# Patient Record
Sex: Female | Born: 1935 | Race: White | Hispanic: No | Smoking: Never smoker
Health system: Southern US, Community
[De-identification: ages and names within clinical notes are randomized; demographics above are authoritative.]

## PROBLEM LIST (undated history)

## (undated) DIAGNOSIS — K219 Gastro-esophageal reflux disease without esophagitis: Secondary | ICD-10-CM

## (undated) DIAGNOSIS — I10 Essential (primary) hypertension: Secondary | ICD-10-CM

## (undated) DIAGNOSIS — T4145XA Adverse effect of unspecified anesthetic, initial encounter: Secondary | ICD-10-CM

## (undated) DIAGNOSIS — T8859XA Other complications of anesthesia, initial encounter: Secondary | ICD-10-CM

## (undated) DIAGNOSIS — E785 Hyperlipidemia, unspecified: Secondary | ICD-10-CM

## (undated) DIAGNOSIS — Z9889 Other specified postprocedural states: Secondary | ICD-10-CM

## (undated) DIAGNOSIS — K469 Unspecified abdominal hernia without obstruction or gangrene: Secondary | ICD-10-CM

## (undated) DIAGNOSIS — I219 Acute myocardial infarction, unspecified: Secondary | ICD-10-CM

## (undated) DIAGNOSIS — R112 Nausea with vomiting, unspecified: Secondary | ICD-10-CM

## (undated) HISTORY — DX: Unspecified abdominal hernia without obstruction or gangrene: K46.9

## (undated) HISTORY — DX: Gastro-esophageal reflux disease without esophagitis: K21.9

## (undated) HISTORY — DX: Hyperlipidemia, unspecified: E78.5

---

## 1979-02-22 HISTORY — PX: VAGINAL HYSTERECTOMY: SUR661

## 1998-03-06 ENCOUNTER — Ambulatory Visit (HOSPITAL_COMMUNITY): Admission: RE | Admit: 1998-03-06 | Discharge: 1998-03-06 | Payer: Self-pay | Admitting: Obstetrics and Gynecology

## 1998-09-03 ENCOUNTER — Encounter: Payer: Self-pay | Admitting: Obstetrics and Gynecology

## 1998-09-03 ENCOUNTER — Ambulatory Visit (HOSPITAL_COMMUNITY): Admission: RE | Admit: 1998-09-03 | Discharge: 1998-09-03 | Payer: Self-pay | Admitting: Obstetrics and Gynecology

## 1998-12-13 ENCOUNTER — Encounter: Payer: Self-pay | Admitting: Obstetrics and Gynecology

## 1998-12-18 ENCOUNTER — Encounter (INDEPENDENT_AMBULATORY_CARE_PROVIDER_SITE_OTHER): Payer: Self-pay | Admitting: Specialist

## 1998-12-18 ENCOUNTER — Inpatient Hospital Stay (HOSPITAL_COMMUNITY): Admission: RE | Admit: 1998-12-18 | Discharge: 1998-12-19 | Payer: Self-pay | Admitting: Obstetrics and Gynecology

## 1999-03-08 ENCOUNTER — Ambulatory Visit (HOSPITAL_COMMUNITY): Admission: RE | Admit: 1999-03-08 | Discharge: 1999-03-08 | Payer: Self-pay | Admitting: *Deleted

## 1999-12-26 ENCOUNTER — Ambulatory Visit (HOSPITAL_BASED_OUTPATIENT_CLINIC_OR_DEPARTMENT_OTHER): Admission: RE | Admit: 1999-12-26 | Discharge: 1999-12-26 | Payer: Self-pay | Admitting: Obstetrics and Gynecology

## 2000-03-16 ENCOUNTER — Ambulatory Visit (HOSPITAL_COMMUNITY): Admission: RE | Admit: 2000-03-16 | Discharge: 2000-03-16 | Payer: Self-pay | Admitting: *Deleted

## 2001-03-18 ENCOUNTER — Ambulatory Visit (HOSPITAL_COMMUNITY): Admission: RE | Admit: 2001-03-18 | Discharge: 2001-03-18 | Payer: Self-pay | Admitting: *Deleted

## 2001-04-06 ENCOUNTER — Encounter: Admission: RE | Admit: 2001-04-06 | Discharge: 2001-04-06 | Payer: Self-pay | Admitting: *Deleted

## 2002-03-22 ENCOUNTER — Ambulatory Visit (HOSPITAL_COMMUNITY): Admission: RE | Admit: 2002-03-22 | Discharge: 2002-03-22 | Payer: Self-pay | Admitting: Internal Medicine

## 2002-03-22 ENCOUNTER — Encounter: Payer: Self-pay | Admitting: Internal Medicine

## 2003-03-24 ENCOUNTER — Encounter: Payer: Self-pay | Admitting: Internal Medicine

## 2003-03-24 ENCOUNTER — Ambulatory Visit (HOSPITAL_COMMUNITY): Admission: RE | Admit: 2003-03-24 | Discharge: 2003-03-24 | Payer: Self-pay | Admitting: Internal Medicine

## 2003-04-04 ENCOUNTER — Encounter: Payer: Self-pay | Admitting: Cardiology

## 2003-04-04 ENCOUNTER — Inpatient Hospital Stay (HOSPITAL_COMMUNITY): Admission: EM | Admit: 2003-04-04 | Discharge: 2003-04-05 | Payer: Self-pay | Admitting: *Deleted

## 2003-04-05 HISTORY — PX: CARDIAC CATHETERIZATION: SHX172

## 2004-04-02 ENCOUNTER — Ambulatory Visit (HOSPITAL_COMMUNITY): Admission: RE | Admit: 2004-04-02 | Discharge: 2004-04-02 | Payer: Self-pay | Admitting: Internal Medicine

## 2004-06-07 ENCOUNTER — Ambulatory Visit (HOSPITAL_COMMUNITY): Admission: RE | Admit: 2004-06-07 | Discharge: 2004-06-07 | Payer: Self-pay | Admitting: *Deleted

## 2004-06-07 ENCOUNTER — Encounter (INDEPENDENT_AMBULATORY_CARE_PROVIDER_SITE_OTHER): Payer: Self-pay | Admitting: Specialist

## 2005-04-08 ENCOUNTER — Ambulatory Visit (HOSPITAL_COMMUNITY): Admission: RE | Admit: 2005-04-08 | Discharge: 2005-04-08 | Payer: Self-pay | Admitting: Internal Medicine

## 2005-06-25 ENCOUNTER — Encounter: Admission: RE | Admit: 2005-06-25 | Discharge: 2005-06-25 | Payer: Self-pay | Admitting: Internal Medicine

## 2005-09-17 HISTORY — PX: KNEE ARTHROSCOPY: SUR90

## 2006-04-22 ENCOUNTER — Ambulatory Visit (HOSPITAL_COMMUNITY): Admission: RE | Admit: 2006-04-22 | Discharge: 2006-04-22 | Payer: Self-pay | Admitting: Internal Medicine

## 2007-04-30 ENCOUNTER — Ambulatory Visit (HOSPITAL_COMMUNITY): Admission: RE | Admit: 2007-04-30 | Discharge: 2007-04-30 | Payer: Self-pay | Admitting: Internal Medicine

## 2008-05-22 ENCOUNTER — Ambulatory Visit (HOSPITAL_COMMUNITY): Admission: RE | Admit: 2008-05-22 | Discharge: 2008-05-22 | Payer: Self-pay | Admitting: Internal Medicine

## 2009-05-28 ENCOUNTER — Ambulatory Visit (HOSPITAL_COMMUNITY): Admission: RE | Admit: 2009-05-28 | Discharge: 2009-05-28 | Payer: Self-pay | Admitting: Family Medicine

## 2010-05-31 ENCOUNTER — Ambulatory Visit (HOSPITAL_COMMUNITY)
Admission: RE | Admit: 2010-05-31 | Discharge: 2010-05-31 | Payer: Self-pay | Source: Home / Self Care | Attending: Internal Medicine | Admitting: Internal Medicine

## 2010-07-14 ENCOUNTER — Encounter: Payer: Self-pay | Admitting: Internal Medicine

## 2010-11-08 NOTE — Op Note (Signed)
Julie Petty, Julie Petty             ACCOUNT NO.:  [id number]   MEDICAL RECORD NO.:  [id number]          PATIENT TYPE:  AMB   LOCATION:  ENDO                         FACILITY:  [facility]   PHYSICIAN:  Georgiana Spinner, M.D.    DATE OF BIRTH:  Feb 09, 1932   DATE OF PROCEDURE:  06/07/2004  DATE OF DISCHARGE:                                 OPERATIVE REPORT   PROCEDURE:  Upper endoscopy.   INDICATIONS FOR PROCEDURE:  GERD   ANESTHESIA:  Demerol 50, Versed 5 mg.   DESCRIPTION OF PROCEDURE:  With the patient mildly sedated in the left  lateral decubitus position, the Olympus videoscopic endoscope was inserted  in the mouth and passed under direct vision through the esophagus which  appeared normal. There was no evidence of Barrett's.  On initial  visualization, we entered into the stomach. The fundus, body, antrum,  duodenal bulb and second portion of the duodenum were visualized.  From this  point, the endoscope was slowly withdrawn taking circumferential views of  the duodenal mucosa until the endoscope had been pulled back in the stomach,  placed in retroflexion to view the stomach from below. The endoscope was  then straightened and withdrawn taking circumferential views of the  remaining gastric and esophageal mucosa stopping in the body of the stomach  where there were some erythematous changes seen consistent with a possible  gastritis which was photographed and biopsied to rule out H. pylori.  The  endoscope was then withdrawn and stopped at the esophagus where there was an  [place] of tissue that was consistent with possible [place] of Barrett's which  also was then photographed and biopsied. The endoscope was withdrawn.  The  patient's vital signs and pulse oximeter remained stable. The patient  tolerated the procedure well without apparent complications.   FINDINGS:  Changes of erythema body of stomach, await biopsy report.  Question of Barrett's, await biopsy report. The patient will  call me for  results and followup with me as an outpatient.  Proceed to colonoscopy as  planned.      GMO/MEDQ  D:  06/07/2004  T:  06/07/2004  Job:  [id number]

## 2010-11-08 NOTE — Op Note (Signed)
[place]  Patient:    Julie Petty, Julie Petty                   MRN: [id number] Proc. Date: 12/26/99 Attending:  Charmian Muff, M.D. CC:         Dr. 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  POSTOPERATIVE DIAGNOSIS:  Bilateral upper eyelid dermatochalasis blocking the superior field of vision.  PROCEDURE:  Bilateral upper eyelid blepharoplasty.  SURGEON:  Charmian Muff, M.D.  ESTIMATED BLOOD LOSS:  2 cc.  SPECIMEN:  None.  COMPLICATIONS:  Without.  PROCEDURE:  The patient was taken to the operating room and placed in the supine position.  Electronic cardiac monitoring, intravenous and pulse oximeter and anesthesia standby were in place.  Xylocaine 2% with epinephrine and 0.75% Marcaine and Wydase were injected into both upper eyelids.  The facial area was draped and prepped per routine for oculoplastic surgery.  The eyelid creases were marked symmetrically, measuring 10 mm above the lashes in a vertical plane of the pupil.  The amount of skin to be removed was then marked off using the ______ forceps.  Dissection was carried down through the skin with the [place] tipped Bovie cautery.  The skin was then excised with the same cautery.  Meticulous hemostasis was obtained.  Skin was closed with a running 6-0 Prolene suture.  Polysporin ointment was placed in the incision.  The patient tolerated the procedure well without apparent complications and returned to the recovery room.  FOLLOW-UP:  I am to call the patient tomorrow morning and will setup for a follow-up appointment and see how the patient is doing.  PREOPERATIVE NOTE:  Julie Petty is a 77 year old woman, who complains of her eyelids drooping down.  When she pulls her eyelids up she can see T.V. better and read better.  On  examination vision with correction was 20/20 bilaterally.  The cornea showed a good tear film bilaterally.  The marginal reflex distance was 3 in both eyes.  She had 3+ dermatochalasis of both upper eyelids.  Goldmann visual field show improvement in the superior field of vision in the left eye from 10 degrees to 50 degrees and the right eye from 30 degrees to 50 degrees with the lids taped up as would be obtained by surgery.  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  DISPOSITION:  Bilateral upper eyelid blepharoplasty to be performed under local anesthesia as an outpatient. DD:  12/26/99 TD:  12/26/99 Job: [id number]

## 2010-11-08 NOTE — Op Note (Signed)
Julie Petty, Julie Petty             ACCOUNT NO.:  [id number]   MEDICAL RECORD NO.:  [id number]          PATIENT TYPE:  AMB   LOCATION:  ENDO                         FACILITY:  [facility]   PHYSICIAN:  Georgiana Spinner, M.D.    DATE OF BIRTH:  [birth year]   DATE OF PROCEDURE:  06/07/2004  DATE OF DISCHARGE:                                 OPERATIVE REPORT   PROCEDURE:  Colonoscopy.   ANESTHESIA:  Demerol 20, Versed 1 mg.   INDICATIONS FOR PROCEDURE:  Cancer screening.   DESCRIPTION OF PROCEDURE:  With the patient mildly sedated in the left  lateral decubitus position, the Olympus videoscopic colonoscope was inserted  in the rectum and passed under direct vision to the cecum identified by the  ileocecal valve and  base of cecum.  From this point, the colonoscope was  slowly withdrawn taking circumferential views of the colonic mucosa stopping  in the rectum which appeared normal on direct and showed hemorrhoids on  retroflexed view. The endoscope was straightened and withdrawn. The  patient's vital signs and pulse oximeter remained stable. The patient  tolerated the procedure well without apparent complications.   FINDINGS:  Internal hemorrhoids otherwise an unremarkable colonoscopic  examination to the cecum.   PLAN:  Consider repeat examination possibly in five years.      GMO/MEDQ  D:  06/07/2004  T:  06/07/2004  Job:  [id number]

## 2010-12-11 ENCOUNTER — Encounter (INDEPENDENT_AMBULATORY_CARE_PROVIDER_SITE_OTHER): Payer: Self-pay | Admitting: General Surgery

## 2010-12-16 ENCOUNTER — Other Ambulatory Visit (INDEPENDENT_AMBULATORY_CARE_PROVIDER_SITE_OTHER): Payer: Self-pay | Admitting: General Surgery

## 2010-12-16 ENCOUNTER — Ambulatory Visit
Admission: RE | Admit: 2010-12-16 | Discharge: 2010-12-16 | Disposition: A | Discharge status: Home / Self Care | Payer: Medicare Other | Source: Ambulatory Visit | Attending: General Surgery | Admitting: General Surgery

## 2010-12-16 DIAGNOSIS — K4090 Unilateral inguinal hernia, without obstruction or gangrene, not specified as recurrent: Secondary | ICD-10-CM

## 2010-12-20 DIAGNOSIS — K4090 Unilateral inguinal hernia, without obstruction or gangrene, not specified as recurrent: Secondary | ICD-10-CM

## 2011-01-07 ENCOUNTER — Telehealth (INDEPENDENT_AMBULATORY_CARE_PROVIDER_SITE_OTHER): Payer: Self-pay | Admitting: General Surgery

## 2011-01-13 ENCOUNTER — Encounter (INDEPENDENT_AMBULATORY_CARE_PROVIDER_SITE_OTHER): Payer: Self-pay | Admitting: General Surgery

## 2011-01-14 ENCOUNTER — Ambulatory Visit (INDEPENDENT_AMBULATORY_CARE_PROVIDER_SITE_OTHER): Payer: Medicare Other | Admitting: General Surgery

## 2011-01-14 DIAGNOSIS — K4090 Unilateral inguinal hernia, without obstruction or gangrene, not specified as recurrent: Secondary | ICD-10-CM | POA: Insufficient documentation

## 2011-01-14 NOTE — Progress Notes (Signed)
Operation: RIH repair  Date: 12/20/10  Pathology: Not done  HPI:  She is here for her first postop visit.  No pain in right groin.  Having some "shooting" pains around left anterior superior iliac spine.  No difficulty voiding or with BMs.   Physical Exam:  Right groin incision c/d/i with minimal swelling; repair solid.  Left groin with no hernia.  No LLQ hernia.  Some swelling and tenderness at ant. sup. iliac spine area.   Assessment:  s/p RIH repair.  Has muscular strain, most likely, on left side.  Doing well on right side.  Plan:  NSAID and moist heat to left side.  Return in 3 mos. if LLQ pain still present and will consider CT.  Continue light activities for 2 more weeks.  May walk ad lib.

## 2011-01-14 NOTE — Patient Instructions (Signed)
Call if left side better not resolved in 3 months.  Moist heat to area.

## 2011-01-23 ENCOUNTER — Encounter (INDEPENDENT_AMBULATORY_CARE_PROVIDER_SITE_OTHER): Payer: Self-pay | Admitting: General Surgery

## 2011-06-25 ENCOUNTER — Other Ambulatory Visit (HOSPITAL_COMMUNITY): Payer: Self-pay | Admitting: Internal Medicine

## 2011-06-25 DIAGNOSIS — Z1231 Encounter for screening mammogram for malignant neoplasm of breast: Secondary | ICD-10-CM

## 2011-07-23 ENCOUNTER — Ambulatory Visit (HOSPITAL_COMMUNITY): Payer: Medicare Other

## 2011-07-30 ENCOUNTER — Ambulatory Visit (HOSPITAL_COMMUNITY)
Admission: RE | Admit: 2011-07-30 | Discharge: 2011-07-30 | Disposition: A | Discharge status: Home / Self Care | Payer: Medicare Other | Source: Ambulatory Visit | Attending: Internal Medicine | Admitting: Internal Medicine

## 2011-07-30 DIAGNOSIS — Z1231 Encounter for screening mammogram for malignant neoplasm of breast: Secondary | ICD-10-CM

## 2012-05-03 ENCOUNTER — Encounter (INDEPENDENT_AMBULATORY_CARE_PROVIDER_SITE_OTHER): Payer: Medicare Other | Admitting: Ophthalmology

## 2012-05-03 DIAGNOSIS — D3130 Benign neoplasm of unspecified choroid: Secondary | ICD-10-CM

## 2012-05-03 DIAGNOSIS — Q143 Congenital malformation of choroid: Secondary | ICD-10-CM

## 2012-05-03 DIAGNOSIS — H43819 Vitreous degeneration, unspecified eye: Secondary | ICD-10-CM

## 2012-07-19 ENCOUNTER — Other Ambulatory Visit (HOSPITAL_COMMUNITY): Payer: Self-pay | Admitting: Internal Medicine

## 2012-07-19 DIAGNOSIS — Z1231 Encounter for screening mammogram for malignant neoplasm of breast: Secondary | ICD-10-CM

## 2012-08-02 ENCOUNTER — Ambulatory Visit (HOSPITAL_COMMUNITY)
Admission: RE | Admit: 2012-08-02 | Discharge: 2012-08-02 | Disposition: A | Discharge status: Home / Self Care | Payer: Medicare Other | Source: Ambulatory Visit | Attending: Internal Medicine | Admitting: Internal Medicine

## 2012-08-02 DIAGNOSIS — Z1231 Encounter for screening mammogram for malignant neoplasm of breast: Secondary | ICD-10-CM

## 2013-05-09 ENCOUNTER — Ambulatory Visit (INDEPENDENT_AMBULATORY_CARE_PROVIDER_SITE_OTHER): Payer: Medicare Other | Admitting: Ophthalmology

## 2013-05-09 DIAGNOSIS — H318 Other specified disorders of choroid: Secondary | ICD-10-CM

## 2013-05-09 DIAGNOSIS — D3130 Benign neoplasm of unspecified choroid: Secondary | ICD-10-CM

## 2013-08-03 ENCOUNTER — Other Ambulatory Visit (HOSPITAL_COMMUNITY): Payer: Self-pay | Admitting: Internal Medicine

## 2013-08-03 DIAGNOSIS — Z1231 Encounter for screening mammogram for malignant neoplasm of breast: Secondary | ICD-10-CM

## 2013-08-11 ENCOUNTER — Ambulatory Visit (HOSPITAL_COMMUNITY): Payer: Medicare Other

## 2013-08-18 ENCOUNTER — Ambulatory Visit (HOSPITAL_COMMUNITY): Payer: Medicare Other

## 2013-11-08 ENCOUNTER — Ambulatory Visit (HOSPITAL_COMMUNITY): Payer: Medicare Other

## 2013-11-17 ENCOUNTER — Ambulatory Visit (HOSPITAL_COMMUNITY)
Admission: RE | Admit: 2013-11-17 | Discharge: 2013-11-17 | Disposition: A | Discharge status: Home / Self Care | Payer: Medicare Other | Source: Ambulatory Visit | Attending: Internal Medicine | Admitting: Internal Medicine

## 2013-11-17 DIAGNOSIS — Z1231 Encounter for screening mammogram for malignant neoplasm of breast: Secondary | ICD-10-CM

## 2014-05-09 ENCOUNTER — Ambulatory Visit (INDEPENDENT_AMBULATORY_CARE_PROVIDER_SITE_OTHER): Payer: Medicare Other | Admitting: Ophthalmology

## 2014-05-09 DIAGNOSIS — I10 Essential (primary) hypertension: Secondary | ICD-10-CM

## 2014-05-09 DIAGNOSIS — D3132 Benign neoplasm of left choroid: Secondary | ICD-10-CM

## 2014-05-09 DIAGNOSIS — H43813 Vitreous degeneration, bilateral: Secondary | ICD-10-CM

## 2014-05-09 DIAGNOSIS — H35033 Hypertensive retinopathy, bilateral: Secondary | ICD-10-CM

## 2014-07-05 DIAGNOSIS — I10 Essential (primary) hypertension: Secondary | ICD-10-CM | POA: Diagnosis not present

## 2014-07-21 DIAGNOSIS — I10 Essential (primary) hypertension: Secondary | ICD-10-CM | POA: Diagnosis not present

## 2014-07-21 DIAGNOSIS — E782 Mixed hyperlipidemia: Secondary | ICD-10-CM | POA: Diagnosis not present

## 2014-07-21 DIAGNOSIS — N183 Chronic kidney disease, stage 3 (moderate): Secondary | ICD-10-CM | POA: Diagnosis not present

## 2014-11-14 ENCOUNTER — Other Ambulatory Visit (HOSPITAL_COMMUNITY): Payer: Self-pay | Admitting: Internal Medicine

## 2014-11-14 DIAGNOSIS — Z1231 Encounter for screening mammogram for malignant neoplasm of breast: Secondary | ICD-10-CM

## 2014-11-21 ENCOUNTER — Ambulatory Visit (HOSPITAL_COMMUNITY)
Admission: RE | Admit: 2014-11-21 | Discharge: 2014-11-21 | Disposition: A | Discharge status: Home / Self Care | Payer: Medicare Other | Source: Ambulatory Visit | Attending: Internal Medicine | Admitting: Internal Medicine

## 2014-11-21 DIAGNOSIS — Z1231 Encounter for screening mammogram for malignant neoplasm of breast: Secondary | ICD-10-CM | POA: Diagnosis not present

## 2014-12-22 DIAGNOSIS — Z0000 Encounter for general adult medical examination without abnormal findings: Secondary | ICD-10-CM | POA: Diagnosis not present

## 2014-12-22 DIAGNOSIS — I10 Essential (primary) hypertension: Secondary | ICD-10-CM | POA: Diagnosis not present

## 2014-12-22 DIAGNOSIS — E782 Mixed hyperlipidemia: Secondary | ICD-10-CM | POA: Diagnosis not present

## 2014-12-22 DIAGNOSIS — N183 Chronic kidney disease, stage 3 (moderate): Secondary | ICD-10-CM | POA: Diagnosis not present

## 2015-01-01 DIAGNOSIS — K219 Gastro-esophageal reflux disease without esophagitis: Secondary | ICD-10-CM | POA: Diagnosis not present

## 2015-01-01 DIAGNOSIS — R05 Cough: Secondary | ICD-10-CM | POA: Diagnosis not present

## 2015-01-02 DIAGNOSIS — I10 Essential (primary) hypertension: Secondary | ICD-10-CM | POA: Diagnosis not present

## 2015-01-02 DIAGNOSIS — M791 Myalgia: Secondary | ICD-10-CM | POA: Diagnosis not present

## 2015-01-02 DIAGNOSIS — Z0000 Encounter for general adult medical examination without abnormal findings: Secondary | ICD-10-CM | POA: Diagnosis not present

## 2015-01-02 DIAGNOSIS — Z780 Asymptomatic menopausal state: Secondary | ICD-10-CM | POA: Diagnosis not present

## 2015-01-02 DIAGNOSIS — N183 Chronic kidney disease, stage 3 (moderate): Secondary | ICD-10-CM | POA: Diagnosis not present

## 2015-01-02 DIAGNOSIS — E782 Mixed hyperlipidemia: Secondary | ICD-10-CM | POA: Diagnosis not present

## 2015-01-18 DIAGNOSIS — H04123 Dry eye syndrome of bilateral lacrimal glands: Secondary | ICD-10-CM | POA: Diagnosis not present

## 2015-01-18 DIAGNOSIS — H3531 Nonexudative age-related macular degeneration: Secondary | ICD-10-CM | POA: Diagnosis not present

## 2015-01-18 DIAGNOSIS — Z961 Presence of intraocular lens: Secondary | ICD-10-CM | POA: Diagnosis not present

## 2015-01-18 DIAGNOSIS — H4011X1 Primary open-angle glaucoma, mild stage: Secondary | ICD-10-CM | POA: Diagnosis not present

## 2015-01-29 DIAGNOSIS — R05 Cough: Secondary | ICD-10-CM | POA: Diagnosis not present

## 2015-04-18 DIAGNOSIS — H04123 Dry eye syndrome of bilateral lacrimal glands: Secondary | ICD-10-CM | POA: Diagnosis not present

## 2015-04-18 DIAGNOSIS — H401131 Primary open-angle glaucoma, bilateral, mild stage: Secondary | ICD-10-CM | POA: Diagnosis not present

## 2015-04-18 DIAGNOSIS — H353130 Nonexudative age-related macular degeneration, bilateral, stage unspecified: Secondary | ICD-10-CM | POA: Diagnosis not present

## 2015-04-18 DIAGNOSIS — H01003 Unspecified blepharitis right eye, unspecified eyelid: Secondary | ICD-10-CM | POA: Diagnosis not present

## 2015-05-11 ENCOUNTER — Ambulatory Visit (INDEPENDENT_AMBULATORY_CARE_PROVIDER_SITE_OTHER): Payer: Medicare Other | Admitting: Ophthalmology

## 2015-07-10 DIAGNOSIS — E782 Mixed hyperlipidemia: Secondary | ICD-10-CM | POA: Diagnosis not present

## 2015-07-10 DIAGNOSIS — N183 Chronic kidney disease, stage 3 (moderate): Secondary | ICD-10-CM | POA: Diagnosis not present

## 2015-07-10 DIAGNOSIS — I10 Essential (primary) hypertension: Secondary | ICD-10-CM | POA: Diagnosis not present

## 2015-07-17 DIAGNOSIS — L03115 Cellulitis of right lower limb: Secondary | ICD-10-CM | POA: Diagnosis not present

## 2015-07-17 DIAGNOSIS — N183 Chronic kidney disease, stage 3 (moderate): Secondary | ICD-10-CM | POA: Diagnosis not present

## 2015-07-17 DIAGNOSIS — E782 Mixed hyperlipidemia: Secondary | ICD-10-CM | POA: Diagnosis not present

## 2015-07-17 DIAGNOSIS — I10 Essential (primary) hypertension: Secondary | ICD-10-CM | POA: Diagnosis not present

## 2015-08-11 DIAGNOSIS — L309 Dermatitis, unspecified: Secondary | ICD-10-CM | POA: Diagnosis not present

## 2015-08-31 DIAGNOSIS — D0462 Carcinoma in situ of skin of left upper limb, including shoulder: Secondary | ICD-10-CM | POA: Diagnosis not present

## 2015-08-31 DIAGNOSIS — D225 Melanocytic nevi of trunk: Secondary | ICD-10-CM | POA: Diagnosis not present

## 2015-08-31 DIAGNOSIS — L723 Sebaceous cyst: Secondary | ICD-10-CM | POA: Diagnosis not present

## 2015-09-12 DIAGNOSIS — L723 Sebaceous cyst: Secondary | ICD-10-CM | POA: Diagnosis not present

## 2015-10-15 DIAGNOSIS — I10 Essential (primary) hypertension: Secondary | ICD-10-CM | POA: Diagnosis not present

## 2015-10-15 DIAGNOSIS — E782 Mixed hyperlipidemia: Secondary | ICD-10-CM | POA: Diagnosis not present

## 2015-10-15 DIAGNOSIS — R05 Cough: Secondary | ICD-10-CM | POA: Diagnosis not present

## 2015-10-16 DIAGNOSIS — Z08 Encounter for follow-up examination after completed treatment for malignant neoplasm: Secondary | ICD-10-CM | POA: Diagnosis not present

## 2015-10-16 DIAGNOSIS — Z85828 Personal history of other malignant neoplasm of skin: Secondary | ICD-10-CM | POA: Diagnosis not present

## 2015-10-16 DIAGNOSIS — L723 Sebaceous cyst: Secondary | ICD-10-CM | POA: Diagnosis not present

## 2016-03-26 ENCOUNTER — Other Ambulatory Visit: Payer: Self-pay | Admitting: Physician Assistant

## 2016-03-26 ENCOUNTER — Other Ambulatory Visit: Payer: Self-pay | Admitting: Internal Medicine

## 2016-03-26 DIAGNOSIS — Z1231 Encounter for screening mammogram for malignant neoplasm of breast: Secondary | ICD-10-CM

## 2016-04-10 ENCOUNTER — Ambulatory Visit
Admission: RE | Admit: 2016-04-10 | Discharge: 2016-04-10 | Disposition: A | Discharge status: Home / Self Care | Payer: Medicare Other | Source: Ambulatory Visit | Attending: Physician Assistant | Admitting: Physician Assistant

## 2016-04-10 DIAGNOSIS — Z1231 Encounter for screening mammogram for malignant neoplasm of breast: Secondary | ICD-10-CM

## 2017-03-05 ENCOUNTER — Other Ambulatory Visit: Payer: Self-pay | Admitting: Physician Assistant

## 2017-03-05 DIAGNOSIS — Z1231 Encounter for screening mammogram for malignant neoplasm of breast: Secondary | ICD-10-CM

## 2017-04-13 ENCOUNTER — Ambulatory Visit
Admission: RE | Admit: 2017-04-13 | Discharge: 2017-04-13 | Disposition: A | Discharge status: Home / Self Care | Payer: Self-pay | Source: Ambulatory Visit | Attending: Physician Assistant | Admitting: Physician Assistant

## 2017-04-13 DIAGNOSIS — Z1231 Encounter for screening mammogram for malignant neoplasm of breast: Secondary | ICD-10-CM

## 2018-03-29 ENCOUNTER — Other Ambulatory Visit: Payer: Self-pay | Admitting: Physician Assistant

## 2018-03-29 DIAGNOSIS — Z1231 Encounter for screening mammogram for malignant neoplasm of breast: Secondary | ICD-10-CM

## 2018-04-24 ENCOUNTER — Inpatient Hospital Stay (HOSPITAL_COMMUNITY)
Admission: EM | Admit: 2018-04-24 | Discharge: 2018-04-28 | DRG: 281 | Disposition: A | Discharge status: Home / Self Care | Payer: Medicare Other | Attending: Cardiovascular Disease | Admitting: Cardiovascular Disease

## 2018-04-24 ENCOUNTER — Encounter (HOSPITAL_COMMUNITY): Payer: Self-pay | Admitting: Emergency Medicine

## 2018-04-24 ENCOUNTER — Emergency Department (HOSPITAL_COMMUNITY): Payer: Medicare Other

## 2018-04-24 ENCOUNTER — Other Ambulatory Visit: Payer: Self-pay

## 2018-04-24 DIAGNOSIS — I4510 Unspecified right bundle-branch block: Secondary | ICD-10-CM | POA: Diagnosis present

## 2018-04-24 DIAGNOSIS — I429 Cardiomyopathy, unspecified: Secondary | ICD-10-CM

## 2018-04-24 DIAGNOSIS — Z882 Allergy status to sulfonamides status: Secondary | ICD-10-CM

## 2018-04-24 DIAGNOSIS — I959 Hypotension, unspecified: Secondary | ICD-10-CM | POA: Diagnosis present

## 2018-04-24 DIAGNOSIS — I2510 Atherosclerotic heart disease of native coronary artery without angina pectoris: Secondary | ICD-10-CM | POA: Diagnosis present

## 2018-04-24 DIAGNOSIS — K219 Gastro-esophageal reflux disease without esophagitis: Secondary | ICD-10-CM | POA: Diagnosis present

## 2018-04-24 DIAGNOSIS — Z885 Allergy status to narcotic agent status: Secondary | ICD-10-CM | POA: Diagnosis not present

## 2018-04-24 DIAGNOSIS — I214 Non-ST elevation (NSTEMI) myocardial infarction: Principal | ICD-10-CM

## 2018-04-24 DIAGNOSIS — Z90710 Acquired absence of both cervix and uterus: Secondary | ICD-10-CM

## 2018-04-24 DIAGNOSIS — R739 Hyperglycemia, unspecified: Secondary | ICD-10-CM | POA: Diagnosis present

## 2018-04-24 DIAGNOSIS — E7800 Pure hypercholesterolemia, unspecified: Secondary | ICD-10-CM | POA: Diagnosis not present

## 2018-04-24 DIAGNOSIS — H409 Unspecified glaucoma: Secondary | ICD-10-CM | POA: Diagnosis present

## 2018-04-24 DIAGNOSIS — I471 Supraventricular tachycardia: Secondary | ICD-10-CM | POA: Diagnosis present

## 2018-04-24 DIAGNOSIS — Z8249 Family history of ischemic heart disease and other diseases of the circulatory system: Secondary | ICD-10-CM

## 2018-04-24 DIAGNOSIS — E785 Hyperlipidemia, unspecified: Secondary | ICD-10-CM | POA: Insufficient documentation

## 2018-04-24 DIAGNOSIS — R079 Chest pain, unspecified: Secondary | ICD-10-CM | POA: Diagnosis present

## 2018-04-24 DIAGNOSIS — I082 Rheumatic disorders of both aortic and tricuspid valves: Secondary | ICD-10-CM | POA: Diagnosis present

## 2018-04-24 DIAGNOSIS — I5030 Unspecified diastolic (congestive) heart failure: Secondary | ICD-10-CM | POA: Diagnosis not present

## 2018-04-24 DIAGNOSIS — I10 Essential (primary) hypertension: Secondary | ICD-10-CM

## 2018-04-24 HISTORY — DX: Essential (primary) hypertension: I10

## 2018-04-24 LAB — TSH: TSH: 1.259 u[IU]/mL (ref 0.350–4.500)

## 2018-04-24 LAB — CBC
HCT: 43.4 % (ref 36.0–46.0)
Hemoglobin: 13.6 g/dL (ref 12.0–15.0)
MCH: 31.6 pg (ref 26.0–34.0)
MCHC: 31.3 g/dL (ref 30.0–36.0)
MCV: 100.9 fL — AB (ref 80.0–100.0)
NRBC: 0 % (ref 0.0–0.2)
Platelets: 219 10*3/uL (ref 150–400)
RBC: 4.3 MIL/uL (ref 3.87–5.11)
RDW: 13 % (ref 11.5–15.5)
WBC: 6.5 10*3/uL (ref 4.0–10.5)

## 2018-04-24 LAB — BASIC METABOLIC PANEL
ANION GAP: 9 (ref 5–15)
BUN: 23 mg/dL (ref 8–23)
CALCIUM: 9.4 mg/dL (ref 8.9–10.3)
CHLORIDE: 104 mmol/L (ref 98–111)
CO2: 24 mmol/L (ref 22–32)
Creatinine, Ser: 1.1 mg/dL — ABNORMAL HIGH (ref 0.44–1.00)
GFR calc Af Amer: 52 mL/min — ABNORMAL LOW (ref >60)
GFR calc non Af Amer: 45 mL/min — ABNORMAL LOW (ref >60)
GLUCOSE: 134 mg/dL — AB (ref 70–99)
POTASSIUM: 4.7 mmol/L (ref 3.5–5.1)
Sodium: 137 mmol/L (ref 135–145)

## 2018-04-24 LAB — I-STAT TROPONIN, ED: Troponin i, poc: 0.25 ng/mL (ref 0.00–0.08)

## 2018-04-24 LAB — HEPATIC FUNCTION PANEL
ALT: 16 U/L (ref 0–44)
AST: 32 U/L (ref 15–41)
Albumin: 3.7 g/dL (ref 3.5–5.0)
Alkaline Phosphatase: 45 U/L (ref 38–126)
BILIRUBIN INDIRECT: 0.7 mg/dL (ref 0.3–0.9)
Bilirubin, Direct: 0.2 mg/dL (ref 0.0–0.2)
TOTAL PROTEIN: 6.8 g/dL (ref 6.5–8.1)
Total Bilirubin: 0.9 mg/dL (ref 0.3–1.2)

## 2018-04-24 LAB — TROPONIN I
TROPONIN I: 3.34 ng/mL — AB (ref <0.03)
Troponin I: 1.12 ng/mL (ref <0.03)

## 2018-04-24 LAB — HEMOGLOBIN A1C
HEMOGLOBIN A1C: 5.6 % (ref 4.8–5.6)
MEAN PLASMA GLUCOSE: 114.02 mg/dL

## 2018-04-24 LAB — MRSA PCR SCREENING: MRSA BY PCR: NEGATIVE

## 2018-04-24 MED ORDER — ACETAMINOPHEN 325 MG PO TABS: 650.0000 mg | ORAL_TABLET | ORAL | Status: DC | PRN

## 2018-04-24 MED ORDER — HEPARIN (PORCINE) IN NACL 100-0.45 UNIT/ML-% IJ SOLN
800.0000 [IU]/h | INTRAMUSCULAR | Status: DC
  Administered 2018-04-24: 750 [IU]/h via INTRAVENOUS
  Administered 2018-04-25: 800 [IU]/h via INTRAVENOUS
  Filled 2018-04-24 (×2): qty 250

## 2018-04-24 MED ORDER — METOPROLOL TARTRATE 25 MG PO TABS
25.0000 mg | ORAL_TABLET | Freq: Two times a day (BID) | ORAL | Status: DC
  Administered 2018-04-24: 25 mg via ORAL
  Filled 2018-04-24: qty 1

## 2018-04-24 MED ORDER — SODIUM CHLORIDE 0.9% FLUSH: 3.0000 mL | INTRAVENOUS | Status: DC | PRN

## 2018-04-24 MED ORDER — SODIUM CHLORIDE 0.9 % IV SOLN: 250.0000 mL | INTRAVENOUS | Status: DC | PRN

## 2018-04-24 MED ORDER — BRINZOLAMIDE 1 % OP SUSP
1.0000 [drp] | Freq: Two times a day (BID) | OPHTHALMIC | Status: DC
  Administered 2018-04-24 – 2018-04-28 (×8): 1 [drp] via OPHTHALMIC
  Filled 2018-04-24: qty 10

## 2018-04-24 MED ORDER — ONDANSETRON HCL 4 MG/2ML IJ SOLN: 4.0000 mg | Freq: Four times a day (QID) | INTRAMUSCULAR | Status: DC | PRN

## 2018-04-24 MED ORDER — ATORVASTATIN CALCIUM 40 MG PO TABS
40.0000 mg | ORAL_TABLET | Freq: Every evening | ORAL | Status: DC
  Administered 2018-04-24 – 2018-04-25 (×2): 40 mg via ORAL
  Filled 2018-04-24 (×2): qty 1

## 2018-04-24 MED ORDER — ASPIRIN EC 81 MG PO TBEC
81.0000 mg | DELAYED_RELEASE_TABLET | Freq: Every day | ORAL | Status: DC
  Administered 2018-04-25 – 2018-04-28 (×3): 81 mg via ORAL
  Filled 2018-04-24 (×3): qty 1

## 2018-04-24 MED ORDER — ACETAMINOPHEN 325 MG PO TABS
650.0000 mg | ORAL_TABLET | Freq: Once | ORAL | Status: AC
  Administered 2018-04-24: 650 mg via ORAL
  Filled 2018-04-24: qty 2

## 2018-04-24 MED ORDER — SODIUM CHLORIDE 0.9% FLUSH
3.0000 mL | Freq: Two times a day (BID) | INTRAVENOUS | Status: DC
  Administered 2018-04-25 – 2018-04-27 (×4): 3 mL via INTRAVENOUS

## 2018-04-24 MED ORDER — CALCIUM CARBONATE 1250 (500 CA) MG PO TABS
625.0000 mg | ORAL_TABLET | ORAL | Status: DC
  Administered 2018-04-26: 625 mg via ORAL
  Administered 2018-04-28: 500 mg via ORAL
  Filled 2018-04-24 (×3): qty 1

## 2018-04-24 MED ORDER — NITROGLYCERIN 0.4 MG SL SUBL: 0.4000 mg | SUBLINGUAL_TABLET | SUBLINGUAL | Status: DC | PRN

## 2018-04-24 MED ORDER — HEPARIN BOLUS VIA INFUSION
3000.0000 [IU] | Freq: Once | INTRAVENOUS | Status: AC
  Administered 2018-04-24: 3000 [IU] via INTRAVENOUS
  Filled 2018-04-24: qty 3000

## 2018-04-24 NOTE — ED Triage Notes (Signed)
Per EMS- Pt here from home for eval of cp after having family in town, she was up walking a lot and thinks she over did it. She had cp to central chest 324 Asprin at home. Pain currently 4/10. She has RBBB with hx of same. Pt had 1 nitro with no relief.

## 2018-04-24 NOTE — H&P (Addendum)
Cardiology History & Physical    Patient ID: Julie Petty MRN: [id number], DOB: July 02, 1931 Date of Encounter: 04/24/2018, 4:22 PM Primary Physician: Aura Dials, PA-C Primary Cardiologist: No primary care provider on file. New  Primary Electrophysiologist:  None  Chief Complaint: chest pain while shopping Reason for Admission: suspected NSTEMI Requesting MD: Dr Ralene Bathe  HPI: Julie Petty is a 84 y.o. active female with history of HTN & HLD (reported to be controlled on medication), GERD, glaucoma with no prior cardiac hx. She reports a remote cath 15 years ago that was OK. Within her family line of inlaws she has met our fellow Dr. Rhae Hammock before. She is very active and participates in [employer] and does a pedal machine seated at home. She has not had any recent cardiac symptoms.  Today she was in her usual state of health. One of her grandchildren is getting married in a few months so she was shopping for a dress. While at [facility] trying on an outfit she developed left sided substernal chest discomfort that did occasionally radiate to her left jaw. It was not associated with any SOB, n/v, diaphoresis, palpitations. She had never had this before. She felt the need to sit down as she felt unwell. She got herself to her car and drank some water and drove home. She called a friend who'd had heart issues and collectively they decided she should call EMS. En route she received 313m ASA and 1 SL NTG which did not immediately relieve pain, but it has subsided since arriving in the ER. She is now pain free. Initial troponin 0.25 with subsequent value 1.12. Labs otherwise show mild renal insufficiency adjusted for age with Cr 1.1, mild macrocytosis but normal Hgb. CXR shows cardiomegaly and mild bilateral interstitial thickening but no acute changes. VSS - BP low/normal. She has been started on heparin by the ER. EKG shows NSR with IRBBB, cannot exclude Q waves V1-V3, otherwise no acute ST-T  changes.  Past Medical History:  Diagnosis Date  . Essential hypertension   . GERD (gastroesophageal reflux disease)   . Glaucoma   . Hernia   . Hyperlipemia      Surgical History:  Past Surgical History:  Procedure Laterality Date  . CARDIAC CATHETERIZATION  04/05/03  . KNEE ARTHROSCOPY  09/17/05   left  . VAGINAL HYSTERECTOMY  1980's     Home Meds: Prior to Admission medications   Medication Sig Start Date End Date Taking? Authorizing Provider  amLODipine (NORVASC) 5 MG tablet Take 5 mg by mouth daily. 04/12/18  Yes [provider]  atorvastatin (LIPITOR) 20 MG tablet Take 20 mg by mouth every other day.    Yes [provider]  brinzolamide (AZOPT) 1 % ophthalmic suspension Place 1 drop into the right eye 2 (two) times daily.    Yes [provider]  calcium carbonate (OS-CAL) 600 MG TABS Take 600 mg by mouth every other day.    Yes [provider]  triamcinolone (KENALOG) 0.025 % ointment Apply 1 application topically daily. 03/22/18  Yes [provider]    Allergies:  Allergies  Allergen Reactions  . Codeine Nausea Only  . Sulfur Nausea Only    Social History   Socioeconomic History  . Marital status: Divorced    Spouse name: Not on file  . Number of children: Not on file  . Years of education: Not on file  . Highest education level: Not on file  Occupational History  .  Not on file  Social Needs  . Financial resource strain: Not on file  . Food insecurity:    Worry: Not on file    Inability: Not on file  . Transportation needs:    Medical: Not on file    Non-medical: Not on file  Tobacco Use  . Smoking status: Never Smoker  Substance and Sexual Activity  . Alcohol use: No  . Drug use: Not on file  . Sexual activity: Not on file  Lifestyle  . Physical activity:    Days per week: Not on file    Minutes per session: Not on file  . Stress: Not on file  Relationships  . Social connections:    Talks on phone:  Not on file    Gets together: Not on file    Attends religious service: Not on file    Active member of club or organization: Not on file    Attends meetings of clubs or organizations: Not on file    Relationship status: Not on file  . Intimate partner violence:    Fear of current or ex partner: Not on file    Emotionally abused: Not on file    Physically abused: Not on file    Forced sexual activity: Not on file  Other Topics Concern  . Not on file  Social History Narrative  . Not on file     Family History  Problem Relation Age of Onset  . Heart attack Brother     Review of Systems:  All other systems reviewed and are otherwise negative except as noted above.  Labs:   Lab Results  Component Value Date   WBC 6.5 04/24/2018   HGB 13.6 04/24/2018   HCT 43.4 04/24/2018   MCV 100.9 (H) 04/24/2018   PLT 219 04/24/2018    Recent Labs  Lab 04/24/18 1320  NA 137  K 4.7  CL 104  CO2 24  BUN 23  CREATININE 1.10*  CALCIUM 9.4  GLUCOSE 134*   No results for input(s): CKTOTAL, CKMB, TROPONINI in the last 72 hours. No results found for: CHOL, HDL, LDLCALC, TRIG No results found for: DDIMER  Radiology/Studies:  Dg Chest Port 1 View  Result Date: 04/24/2018 CLINICAL DATA:  Left-sided chest pain for 4 hours. No shortness of breath. EXAM: PORTABLE CHEST 1 VIEW COMPARISON:  12/16/2010 FINDINGS: There is bilateral mild interstitial thickening. There is no focal consolidation. There is no pleural effusion or pneumothorax. There is stable cardiomegaly. There is thoracic aortic atherosclerosis. The osseous structures are unremarkable. IMPRESSION: 1. No acute cardiopulmonary disease. 2. Cardiomegaly. Electronically Signed   By: Kathreen Devoid   On: 04/24/2018 15:26   Wt Readings from Last 3 Encounters:  04/24/18 59.9 kg    EKG: NSR with IRBBB, cannot exclude Q waves V1-V3, otherwise no acute ST-T changes.  Physical Exam: Blood pressure 101/61, pulse 63, temperature 98.2 F  (36.8 C), temperature source Oral, resp. rate (!) 22, height 5' 2.5" (1.588 m), weight 59.9 kg, SpO2 98 %. Body mass index is 23.76 kg/m. General: Well developed, well nourished WF, in no acute distress. Alert, friendly. Head: Normocephalic, atraumatic, sclera non-icteric, no xanthomas, nares are without discharge.  Neck: Negative for carotid bruits. JVD not elevated. Lungs: Clear bilaterally to auscultation without wheezes, rales, or rhonchi. Breathing is unlabored. Heart: RRR with S1 S2. No murmurs, rubs, or gallops appreciated. Abdomen: Soft, non-tender, non-distended with normoactive bowel sounds. No hepatomegaly. No rebound/guarding. No obvious abdominal masses. Msk:  Strength  and tone appear normal for age. Extremities: No clubbing or cyanosis. No edema.  Distal pedal pulses are 2+ and equal bilaterally. Neuro: Alert and oriented X 3. No focal deficit. No facial asymmetry. Moves all extremities spontaneously. Psych:  Responds to questions appropriately with a normal affect.    Assessment and Plan   1. Chest pain/probable NSTEMI - troponin 0.25->1.12 with history suggest of NSTEMI. She is fairly active. Continue heparin and add scheduled ASA. Titrate atorvastatin. Anticipate MD will advise cath on Monday. Will place inpatient orders and also obtain echocardiogram. If patient remains stable tomorrow will need pre-cath orders for Monday. I will add to the add-on board.  2. Essential HTN - controlled. Per d/w MD, change amlodipine to BB given MI.  3. Hyperlipidemia - will titrate atorvastatin and obtain lipid panel in AM. If the patient is tolerating statin at time of follow-up appointment, would consider rechecking liver function/lipid panel in 6-8 weeks.  4. Glaucoma - continue home regimen.  5. Hyperglycemia - check A1C.  6. Mild renal insufficiency - trend Cr. May represent CKD.  Code status: patient would like to give this some more thought as she has contemplated a  do-not-rescuscitate. but also acknowledges a good quality of life and . In this setting she wishes to remain full code which we will honor. She will have ongoing discussions with family and review her "paperwork at home" and inform us of any changes.  Severity of Illness: The appropriate patient status for this patient is INPATIENT. Inpatient status is judged to be reasonable and necessary in order to provide the required intensity of service to ensure the patient's safety. The patient's presenting symptoms, physical exam findings, and initial radiographic and laboratory data in the context of their chronic comorbidities is felt to place them at high risk for further clinical deterioration. Furthermore, it is not anticipated that the patient will be medically stable for discharge from the hospital within 2 midnights of admission. The following factors support the patient status of inpatient.   " The patient's presenting symptoms include chest pain. " The worrisome physical exam findings include patient pointing to prior site of pain. " The initial radiographic and laboratory data are worrisome because of elevated troponin. " The chronic co-morbidities include outlined above, HTN, HLD.   * I certify that at the point of admission it is my clinical judgment that the patient will require inpatient hospital care spanning beyond 2 midnights from the point of admission due to high intensity of service, high risk for further deterioration and high frequency of surveillance required.*    For questions or updates, please contact [place] Please consult [email or web address] for contact info under Cardiology/STEMI.  Signed, Charlie Pitter, PA-C 04/24/2018, 4:22 PM   I have seen and examined the patient along with Melina Copa, PA .  I have reviewed the chart, notes and new data.  I agree with PA/NP's note.  Key new complaints: She describes a classical pattern of unstable angina pectoris, worsened by physical  activity, but persistent at rest. Key examination changes: Normal cardiovascular exam Key new findings / data: ECG does not show high risk findings or any abnormalities that could localize the ischemic territory; cardiac enzymes are clearly abnormal and in a slowly rising pattern.  PLAN: Recommend coronary angiography.  Meanwhile we will keep on intravenous heparin.  Switch from amlodipine to beta-blockers.  Aspirin.  Reevaluate lipid profile, target LDL less than 70. The diagnostic catheterization and possible percutaneous revascularization with angioplasty/stent procedure  has been fully reviewed with the patient and written informed consent has been obtained.   Sanda Klein, MD, [place] [phone] 04/24/2018, 5:21 PM

## 2018-04-24 NOTE — ED Notes (Signed)
EDP and cardiology notified of elevated troponin

## 2018-04-24 NOTE — Progress Notes (Deleted)
ANTICOAGULATION CONSULT NOTE - Initial Consult  Pharmacy Consult for Heparin Indication: chest pain/ACS  Allergies  Allergen Reactions  . Codeine Nausea Only  . Sulfur Nausea Only   Patient Measurements: Height: 5' 2.5" (158.8 cm) Weight: 132 lb (59.9 kg) IBW/kg (Calculated) : 51.25 Heparin Dosing Weight: 59.9 kg  Vital Signs: Temp: 98.2 F (36.8 C) (11/02 1313) Temp Source: Oral (11/02 1313) BP: 109/64 (11/02 1715) Pulse Rate: 80 (11/02 1715)  Labs: Recent Labs    04/24/18 1320 04/24/18 1520  HGB 13.6  --   HCT 43.4  --   PLT 219  --   CREATININE 1.10*  --   TROPONINI  --  1.12*   Estimated Creatinine Clearance: 30.8 mL/min (A) (by C-G formula based on SCr of 1.1 mg/dL (H)).  Assessment: 18 yoF presenting with acute onset L sided substernal chest discomfort with occasional radiation to L jaw. Elevated troponin with no acute ST changes on EKG. Pharmacy consulted to start IV heparin with anticipation of cardiac cath. No AC PTA. CBC WNL, no bleeding noted.  Goal of Therapy:  Heparin level 0.3-0.7 units/ml Monitor platelets by anticoagulation protocol: Yes   Plan:  Give heparin 3500 unit bolus IV x1 Start heparin gtt at 750 units/hr Check heparin level in 8 hours Daily heparin level and CBC Monitor for s/sx of bleeding  Erin N. Gerarda Fraction, PharmD PGY2 Infectious Diseases Pharmacy Resident Phone: [phone] 04/24/2018,5:28 PM

## 2018-04-24 NOTE — ED Notes (Addendum)
Messaged main pharmacy in regards to Heparin.

## 2018-04-24 NOTE — ED Provider Notes (Signed)
[place] EMERGENCY DEPARTMENT Provider Note   CSN: [id number] Arrival date & time: 04/24/18  1303     History   Chief Complaint Chief Complaint  Patient presents with  . Chest Pain    HPI Julie Petty is a 84 y.o. female.  The history is provided by the patient and a relative. No language interpreter was used.  Chest Pain     Julie Petty is a 84 y.o. female who presents to the Emergency Department complaining of chest pain. Resents to the emergency department accompanied by family for evaluation of chest pain. Pain is described as a pain sensation in the left chest. It is constant but access and wanes. It occasionally radiates to her left jaw. Symptoms began three to half hours ago while shopping. She denies any fevers, cough, shortness of breath, nausea, vomiting, abdominal pain. No prior similar symptoms. She has a history of hypertension, hyperlipidemia and coronary artery disease in her family (brother with CAD in 16s). Past Medical History:  Diagnosis Date  . Essential hypertension   . GERD (gastroesophageal reflux disease)   . Glaucoma   . Hernia   . Hyperlipemia     Patient Active Problem List   Diagnosis Date Noted  . Inguinal hernia 01/14/2011    Past Surgical History:  Procedure Laterality Date  . CARDIAC CATHETERIZATION  04/05/03  . KNEE ARTHROSCOPY  09/17/05   left  . VAGINAL HYSTERECTOMY  1980's     OB History   None      Home Medications    Prior to Admission medications   Medication Sig Start Date End Date Taking? Authorizing Provider  amLODipine (NORVASC) 5 MG tablet Take 5 mg by mouth daily. 04/12/18  Yes [provider]  atorvastatin (LIPITOR) 20 MG tablet Take 20 mg by mouth every other day.    Yes [provider]  brinzolamide (AZOPT) 1 % ophthalmic suspension Place 1 drop into the right eye 2 (two) times daily.    Yes [provider]  calcium carbonate (OS-CAL) 600 MG TABS Take  600 mg by mouth every other day.    Yes [provider]  triamcinolone (KENALOG) 0.025 % ointment Apply 1 application topically daily. 03/22/18  Yes [provider]    Family History Family History  Problem Relation Age of Onset  . Heart attack Brother     Social History Social History   Tobacco Use  . Smoking status: Never Smoker  Substance Use Topics  . Alcohol use: No  . Drug use: Not on file     Allergies   Codeine and Sulfur   Review of Systems Review of Systems  Cardiovascular: Positive for chest pain.  All other systems reviewed and are negative.    Physical Exam Updated Vital Signs BP 106/71   Pulse 73   Temp 98.2 F (36.8 C) (Oral)   Resp (!) 27   Ht 5' 2.5" (1.588 m)   Wt 59.9 kg   SpO2 93%   BMI 23.76 kg/m   Physical Exam  Constitutional: She is oriented to person, place, and time. She appears well-developed and well-nourished.  HENT:  Head: Normocephalic and atraumatic.  Cardiovascular: Normal rate and regular rhythm.  No murmur heard. Pulmonary/Chest: Effort normal and breath sounds normal. No respiratory distress.  Abdominal: Soft. There is no tenderness. There is no rebound and no guarding.  Musculoskeletal: She exhibits no edema or tenderness.  Neurological: She is alert and oriented to person, place,  and time.  Skin: Skin is warm and dry.  Psychiatric: She has a normal mood and affect. Her behavior is normal.  Nursing note and vitals reviewed.    ED Treatments / Results  Labs (all labs ordered are listed, but only abnormal results are displayed) Labs Reviewed  BASIC METABOLIC PANEL - Abnormal; Notable for the following components:      Result Value   Glucose, Bld 134 (*)    Creatinine, Ser 1.10 (*)    GFR calc non Af Amer 45 (*)    GFR calc Af Amer 52 (*)    All other components within normal limits  CBC - Abnormal; Notable for the following components:   MCV 100.9 (*)    All other components within normal  limits  TROPONIN I - Abnormal; Notable for the following components:   Troponin I 1.12 (*)    All other components within normal limits  I-STAT TROPONIN, ED - Abnormal; Notable for the following components:   Troponin i, poc 0.25 (*)    All other components within normal limits  HEPARIN LEVEL (UNFRACTIONATED)    EKG EKG Interpretation  Date/Time:  Saturday April 24 2018 14:33:46 EDT Ventricular Rate:  72 PR Interval:  164 QRS Duration: 115 QT Interval:  413 QTC Calculation: 452 R Axis:   112 Text Interpretation:  Sinus rhythm Incomplete right bundle branch block Confirmed by Quintella Reichert [phone]) on 04/24/2018 2:57:55 PM   Radiology Dg Chest Port 1 View  Result Date: 04/24/2018 CLINICAL DATA:  Left-sided chest pain for 4 hours. No shortness of breath. EXAM: PORTABLE CHEST 1 VIEW COMPARISON:  12/16/2010 FINDINGS: There is bilateral mild interstitial thickening. There is no focal consolidation. There is no pleural effusion or pneumothorax. There is stable cardiomegaly. There is thoracic aortic atherosclerosis. The osseous structures are unremarkable. IMPRESSION: 1. No acute cardiopulmonary disease. 2. Cardiomegaly. Electronically Signed   By: Kathreen Devoid   On: 04/24/2018 15:26    Procedures Procedures (including critical care time) CRITICAL CARE Performed by: Quintella Reichert   Total critical care time: 35 minutes  Critical care time was exclusive of separately billable procedures and treating other patients.  Critical care was necessary to treat or prevent imminent or life-threatening deterioration.  Critical care was time spent personally by me on the following activities: development of treatment plan with patient and/or surrogate as well as nursing, discussions with consultants, evaluation of patient's response to treatment, examination of patient, obtaining history from patient or surrogate, ordering and performing treatments and interventions, ordering and review of  laboratory studies, ordering and review of radiographic studies, pulse oximetry and re-evaluation of patient's condition.  Medications Ordered in ED Medications  heparin ADULT infusion 100 units/mL (25000 units/278mL sodium chloride 0.45%) (750 Units/hr Intravenous New Bag/Given 04/24/18 1613)  heparin bolus via infusion 3,000 Units (3,000 Units Intravenous Bolus from Bag 04/24/18 1614)  acetaminophen (TYLENOL) tablet 650 mg (650 mg Oral Given 04/24/18 1605)     Initial Impression / Assessment and Plan / ED Course  I have reviewed the triage vital signs and the nursing notes.  Pertinent labs & imaging results that were available during my care of the patient were reviewed by me and considered in my medical decision making (see chart for details).     Patient with history of hypertension, hyperlipidemia and family history of CAD here for evaluation of chest pain. EKG with incomplete right bundle branch block. Initial troponin is mildly elevated. Concern for nSTEMI. She took a full 325 mg  of aspirin prior to ED arrival. She was started on heparin drip. Nitroglycerin was held due to her low blood pressure. Cardiology consulted forevaluation and treatment. Patient and family updated of findings of studies recommendation for admission and they are in agreement with treatment plan.  Final Clinical Impressions(s) / ED Diagnoses   Final diagnoses:  None    ED Discharge Orders    None       Quintella Reichert, MD 04/24/18 1655

## 2018-04-24 NOTE — Progress Notes (Signed)
ANTICOAGULATION CONSULT NOTE - Initial Consult  Pharmacy Consult:  Heparin Indication: chest pain/ACS  Allergies  Allergen Reactions  . Codeine Nausea Only  . Sulfur Nausea Only    Patient Measurements: Height: 5' 2.5" (158.8 cm) Weight: 132 lb (59.9 kg) IBW/kg (Calculated) : 51.25 Heparin Dosing Weight: 60 kg  Vital Signs: Temp: 98.2 F (36.8 C) (11/02 1313) Temp Source: Oral (11/02 1313) BP: 116/69 (11/02 1437) Pulse Rate: 80 (11/02 1437)  Labs: Recent Labs    04/24/18 1320  HGB 13.6  HCT 43.4  PLT 219    CrCl cannot be calculated (No successful lab value found.).   Medical History: Past Medical History:  Diagnosis Date  . GERD (gastroesophageal reflux disease)   . Glaucoma   . Hernia   . Hyperlipemia      Assessment: 48 YOF with chest pain and elevated troponin to start IV heparin.  CBC WNL; no bleeding reported.    Goal of Therapy:  Heparin level 0.3-0.7 units/ml Monitor platelets by anticoagulation protocol: Yes    Plan:  Heparin 3000 units IV x 1, then Heparin gtt at 750 units/hr Check 8 hr heparin level Daily heparin level and CBC   Nairi Oswald D. Mina Marble, PharmD, BCPS, [place] 04/24/2018, 3:02 PM

## 2018-04-25 ENCOUNTER — Encounter (HOSPITAL_COMMUNITY): Payer: Self-pay

## 2018-04-25 ENCOUNTER — Inpatient Hospital Stay (HOSPITAL_COMMUNITY): Payer: Medicare Other

## 2018-04-25 DIAGNOSIS — I5030 Unspecified diastolic (congestive) heart failure: Secondary | ICD-10-CM

## 2018-04-25 LAB — CBC
HEMATOCRIT: 38.9 % (ref 36.0–46.0)
HEMOGLOBIN: 12.4 g/dL (ref 12.0–15.0)
MCH: 31.6 pg (ref 26.0–34.0)
MCHC: 31.9 g/dL (ref 30.0–36.0)
MCV: 99 fL (ref 80.0–100.0)
Platelets: 231 10*3/uL (ref 150–400)
RBC: 3.93 MIL/uL (ref 3.87–5.11)
RDW: 13.1 % (ref 11.5–15.5)
WBC: 7.6 10*3/uL (ref 4.0–10.5)
nRBC: 0 % (ref 0.0–0.2)

## 2018-04-25 LAB — LIPID PANEL
CHOL/HDL RATIO: 3.4 ratio
CHOLESTEROL: 174 mg/dL (ref 0–200)
HDL: 51 mg/dL (ref >40)
LDL Cholesterol: 117 mg/dL — ABNORMAL HIGH (ref 0–99)
TRIGLYCERIDES: 32 mg/dL (ref <150)
VLDL: 6 mg/dL (ref 0–40)

## 2018-04-25 LAB — BASIC METABOLIC PANEL
Anion gap: 6 (ref 5–15)
BUN: 18 mg/dL (ref 8–23)
CALCIUM: 8.8 mg/dL — AB (ref 8.9–10.3)
CO2: 25 mmol/L (ref 22–32)
CREATININE: 0.9 mg/dL (ref 0.44–1.00)
Chloride: 104 mmol/L (ref 98–111)
GFR calc Af Amer: >60 mL/min (ref >60)
GFR calc non Af Amer: 57 mL/min — ABNORMAL LOW (ref >60)
Glucose, Bld: 97 mg/dL (ref 70–99)
Potassium: 3.7 mmol/L (ref 3.5–5.1)
Sodium: 135 mmol/L (ref 135–145)

## 2018-04-25 LAB — HEPARIN LEVEL (UNFRACTIONATED)
Heparin Unfractionated: 0.45 IU/mL (ref 0.30–0.70)
Heparin Unfractionated: 0.31 IU/mL (ref 0.30–0.70)

## 2018-04-25 LAB — TROPONIN I: Troponin I: 3.96 ng/mL (ref <0.03)

## 2018-04-25 LAB — ECHOCARDIOGRAM COMPLETE
Height: 62.5 in
Weight: 2084.6698 oz

## 2018-04-25 MED ORDER — SODIUM CHLORIDE 0.9% FLUSH
3.0000 mL | Freq: Two times a day (BID) | INTRAVENOUS | Status: DC
  Administered 2018-04-25 – 2018-04-26 (×3): 3 mL via INTRAVENOUS

## 2018-04-25 MED ORDER — SODIUM CHLORIDE 0.9 % WEIGHT BASED INFUSION
3.0000 mL/kg/h | INTRAVENOUS | Status: DC
  Administered 2018-04-26: 3 mL/kg/h via INTRAVENOUS

## 2018-04-25 MED ORDER — METOPROLOL TARTRATE 12.5 MG HALF TABLET
12.5000 mg | ORAL_TABLET | Freq: Two times a day (BID) | ORAL | Status: DC
  Administered 2018-04-26 – 2018-04-27 (×2): 12.5 mg via ORAL
  Filled 2018-04-25 (×3): qty 1

## 2018-04-25 MED ORDER — ASPIRIN 81 MG PO CHEW
81.0000 mg | CHEWABLE_TABLET | ORAL | Status: AC
  Administered 2018-04-26: 81 mg via ORAL
  Filled 2018-04-25: qty 1

## 2018-04-25 MED ORDER — PERFLUTREN LIPID MICROSPHERE
INTRAVENOUS | Status: AC
  Filled 2018-04-25: qty 10

## 2018-04-25 MED ORDER — PERFLUTREN LIPID MICROSPHERE
1.0000 mL | INTRAVENOUS | Status: AC | PRN
  Administered 2018-04-25: 2 mL via INTRAVENOUS
  Filled 2018-04-25: qty 10

## 2018-04-25 MED ORDER — SODIUM CHLORIDE 0.9% FLUSH: 3.0000 mL | INTRAVENOUS | Status: DC | PRN

## 2018-04-25 MED ORDER — SODIUM CHLORIDE 0.9 % WEIGHT BASED INFUSION
1.0000 mL/kg/h | INTRAVENOUS | Status: DC
  Administered 2018-04-26: 250 mL via INTRAVENOUS

## 2018-04-25 MED ORDER — SODIUM CHLORIDE 0.9 % IV SOLN: 250.0000 mL | INTRAVENOUS | Status: DC | PRN

## 2018-04-25 NOTE — H&P (View-Only) (Signed)
Progress Note  Patient Name: Julie Petty Date of Encounter: 04/25/2018  Primary Cardiologist: Sanda Klein, MD  Subjective   Quiet night. Remains CP free. BP at 0000 listed as 73 systolic; discussed with nursing to repeat this AM - 99/58. Pt resting comfortably.  Tele does show NSR/SB HR nadir 48, intermittent brief ?junctional tach, also brief ventricular bigeminy  Inpatient Medications    Scheduled Meds: . [START ON 04/26/2018] aspirin  81 mg Oral Pre-Cath  . aspirin EC  81 mg Oral Daily  . atorvastatin  40 mg Oral QPM  . brinzolamide  1 drop Right Eye BID  . [START ON 04/26/2018] calcium carbonate  625 mg Oral QODAY  . metoprolol tartrate  12.5 mg Oral BID  . sodium chloride flush  3 mL Intravenous Q12H  . sodium chloride flush  3 mL Intravenous Q12H   Continuous Infusions: . sodium chloride    . sodium chloride    . [START ON 04/26/2018] sodium chloride     Followed by  . [START ON 04/26/2018] sodium chloride    . heparin 750 Units/hr (04/24/18 1613)   PRN Meds: sodium chloride, sodium chloride, acetaminophen, nitroGLYCERIN, ondansetron (ZOFRAN) IV, sodium chloride flush, sodium chloride flush   Vital Signs    Vitals:   04/24/18 2026 04/25/18 0000 04/25/18 0438 04/25/18 0700  BP: 104/89 (!) 75/38    Pulse: 80 (!) 50  (!) 57  Resp:  (!) 23  17  Temp: 97.6 F (36.4 C) 98.3 F (36.8 C) 98.3 F (36.8 C)   TempSrc: Oral Oral Oral   SpO2: 95% 93%  98%  Weight: 59.1 kg     Height:        Intake/Output Summary (Last 24 hours) at 04/25/2018 0855 Last data filed at 04/25/2018 0749 Gross per 24 hour  Intake 202.5 ml  Output 400 ml  Net -197.5 ml   Filed Weights   04/24/18 1441 04/24/18 2026  Weight: 59.9 kg 59.1 kg    Telemetry    Tele does show NSR/SB HR nadir 48, intermittent brief ?junctional tach, also brief ventricular bigeminy - Personally Reviewed  ECG    SB RBBB upsloped ST segment V2 (similar to last EKG), new TWI V4-V6- Personally  Reviewed  Physical Exam   GEN: No acute distress.  HEENT: Normocephalic, atraumatic, sclera non-icteric. Neck: No JVD or bruits. Cardiac: RRR no murmurs, rubs, or gallops.  Radials/DP/PT 1+ and equal bilaterally.  Respiratory: Clear to auscultation bilaterally. Breathing is unlabored. GI: Soft, nontender, non-distended, BS +x 4. MS: no deformity. Extremities: No clubbing or cyanosis. No edema. Distal pedal pulses are 2+ and equal bilaterally. Neuro:  AAOx3. Follows commands. Psych:  Responds to questions appropriately with a normal affect.  Labs    Chemistry Recent Labs  Lab 04/24/18 1320 04/24/18 2058 04/25/18 0009  NA 137  --  135  K 4.7  --  3.7  CL 104  --  104  CO2 24  --  25  GLUCOSE 134*  --  97  BUN 23  --  18  CREATININE 1.10*  --  0.90  CALCIUM 9.4  --  8.8*  PROT  --  6.8  --   ALBUMIN  --  3.7  --   AST  --  32  --   ALT  --  16  --   ALKPHOS  --  45  --   BILITOT  --  0.9  --   GFRNONAA 45*  --  57*  GFRAA 52*  --  >60  ANIONGAP 9  --  6     Hematology Recent Labs  Lab 04/24/18 1320 04/25/18 0009  WBC 6.5 7.6  RBC 4.30 3.93  HGB 13.6 12.4  HCT 43.4 38.9  MCV 100.9* 99.0  MCH 31.6 31.6  MCHC 31.3 31.9  RDW 13.0 13.1  PLT 219 231    Cardiac Enzymes Recent Labs  Lab 04/24/18 1520 04/24/18 2058 04/25/18 0009  TROPONINI 1.12* 3.34* 3.96*    Recent Labs  Lab 04/24/18 1337  TROPIPOC 0.25*     BNPNo results for input(s): BNP, PROBNP in the last 168 hours.   DDimer No results for input(s): DDIMER in the last 168 hours.   Radiology    Dg Chest Port 1 View  Result Date: 04/24/2018 CLINICAL DATA:  Left-sided chest pain for 4 hours. No shortness of breath. EXAM: PORTABLE CHEST 1 VIEW COMPARISON:  12/16/2010 FINDINGS: There is bilateral mild interstitial thickening. There is no focal consolidation. There is no pleural effusion or pneumothorax. There is stable cardiomegaly. There is thoracic aortic atherosclerosis. The osseous structures  are unremarkable. IMPRESSION: 1. No acute cardiopulmonary disease. 2. Cardiomegaly. Electronically Signed   By: Kathreen Devoid   On: 04/24/2018 15:26    Cardiac Studies   pending  Patient Profile     84 y.o. very active female with HTN, HLD, GERD, glaucoma admitted with CP and found to have NSTEMI.  Assessment & Plan    1. Chest pain/probable NSTEMI - troponin 0.25->1.12->3.34->3.96. Remains CP free. EKG with some dynamic changes overnight but patient is asymptomatic. Continue heparin, ASA, titrated statin. Will hold AM BB today and resume this PM at lower dose 12.5mg  BID with hold parameters in place. Plan cath tomorrow. I did place on add-on board. Risks and benefits of cardiac catheterization have been discussed with the patient.  These include bleeding, infection, kidney damage, stroke, heart attack, death.  The patient understands these risks and is willing to proceed. Orders placed. Echo pending.  2. Essential HTN - BP soft overnight and this AM. See changes above to BB. Hold amlodipine.  3. Hyperlipidemia - LDL 117, atorva titrated. If the patient is tolerating statin at time of follow-up appointment, would consider rechecking liver function/lipid panel in 6-8 weeks.  4. Arrhythmia - will also ask MD to review tele - ? Junctional tach briefly, also with SB and brief ventricular bigeminy. Given HR 50 before any further BB, adjusting med as above.  5. Hyperglycemia - glucose 134 on arrival but A1C WNL.  6. Mild renal insufficiency - Cr 1.10 on arrival but 0.9 today.   For questions or updates, please contact [place] Please consult [email or web address] for contact info under Cardiology/STEMI.  Signed, Charlie Pitter, PA-C 04/25/2018, 8:55 AM    I have seen and examined the patient along with Charlie Pitter, PA-C.  I have reviewed the chart, notes and new data.  I agree with PA's note.  Key new complaints: At night, no cardiovascular complaints. Key examination changes:  Cardiovascular exam Key new findings / data: Troponin seems to have peaked at approximately 4.. Note paroxysmal atrial tachycardia on monitor overnight.  Some PVCs.  Now on beta-blockers.  Mild hypotension as we have transitioned her antihypertensive from amlodipine to beta-blocker.  PLAN: Discussed cardiac catheterization and possible angioplasty stent in detail with the patient and her son.  Reviewed pros and cons of the procedure, technical details, possible complications and outcomes.This procedure has been fully reviewed  with the patient and written informed consent has been obtained.   Sanda Klein, MD, [place] [phone] 04/25/2018, 10:06 AM

## 2018-04-25 NOTE — Progress Notes (Signed)
  Echocardiogram 2D Echocardiogram has been performed.  Yula Crotwell L Androw 04/25/2018, 3:09 PM

## 2018-04-25 NOTE — Progress Notes (Signed)
ANTICOAGULATION CONSULT NOTE - Follow Up Consult  Pharmacy Consult for heparin Indication: USAP vs NSTEMI  Labs: Recent Labs    04/24/18 1320 04/24/18 1520 04/24/18 2058 04/25/18 0009  HGB 13.6  --   --  12.4  HCT 43.4  --   --  38.9  PLT 219  --   --  231  HEPARINUNFRC  --   --   --  0.45  CREATININE 1.10*  --   --   --   TROPONINI  --  1.12* 3.34*  --     Assessment/Plan:  84yo female therapeutic on heparin with initial dosing for CP. Will continue gtt at current rate and confirm stable with am labs.   Wynona Neat, PharmD, BCPS  04/25/2018,2:11 AM

## 2018-04-25 NOTE — Plan of Care (Signed)
  Problem: Education: Goal: Understanding of cardiac disease, CV risk reduction, and recovery process will improve Outcome: Progressing Goal: Individualized Educational Video(s) Outcome: Progressing   Problem: Cardiac: Goal: Ability to achieve and maintain adequate cardiovascular perfusion will improve Outcome: Progressing   Problem: Health Behavior/Discharge Planning: Goal: Ability to safely manage health-related needs after discharge will improve Outcome: Progressing   Problem: Education: Goal: Knowledge of General Education information will improve Description Including pain rating scale, medication(s)/side effects and non-pharmacologic comfort measures Outcome: Progressing   Problem: Health Behavior/Discharge Planning: Goal: Ability to manage health-related needs will improve Outcome: Progressing   Problem: Clinical Measurements: Goal: Ability to maintain clinical measurements within normal limits will improve Outcome: Progressing Goal: Will remain free from infection Outcome: Progressing Goal: Diagnostic test results will improve Outcome: Progressing Goal: Respiratory complications will improve Outcome: Progressing Goal: Cardiovascular complication will be avoided Outcome: Progressing   Problem: Activity: Goal: Risk for activity intolerance will decrease Outcome: Progressing   Problem: Nutrition: Goal: Adequate nutrition will be maintained Outcome: Progressing   Problem: Elimination: Goal: Will not experience complications related to bowel motility Outcome: Progressing Goal: Will not experience complications related to urinary retention Outcome: Progressing   Problem: Pain Managment: Goal: General experience of comfort will improve Outcome: Progressing   Problem: Safety: Goal: Ability to remain free from injury will improve Outcome: Progressing

## 2018-04-25 NOTE — Progress Notes (Signed)
Consent obtained and placed in the chart.

## 2018-04-25 NOTE — Progress Notes (Signed)
ANTICOAGULATION CONSULT NOTE - Follow up consult  Pharmacy Consult:  Heparin Indication: chest pain/ACS  Allergies  Allergen Reactions  . Codeine Nausea Only  . Sulfur Nausea Only    Patient Measurements: Height: 5' 2.5" (158.8 cm) Weight: 130 lb 4.7 oz (59.1 kg) IBW/kg (Calculated) : 51.25 Heparin Dosing Weight: 60 kg  Vital Signs: Temp: 98.3 F (36.8 C) (11/03 0438) Temp Source: Oral (11/03 0438) BP: 75/38 (11/03 0000) Pulse Rate: 57 (11/03 0700)  Labs: Recent Labs    04/24/18 1320 04/24/18 1520 04/24/18 2058 04/25/18 0009 04/25/18 0806  HGB 13.6  --   --  12.4  --   HCT 43.4  --   --  38.9  --   PLT 219  --   --  231  --   HEPARINUNFRC  --   --   --  0.45 0.31  CREATININE 1.10*  --   --  0.90  --   TROPONINI  --  1.12* 3.34* 3.96*  --     Estimated Creatinine Clearance: 37.7 mL/min (by C-G formula based on SCr of 0.9 mg/dL).   Medical History: Past Medical History:  Diagnosis Date  . Essential hypertension   . GERD (gastroesophageal reflux disease)   . Glaucoma   . Hernia   . Hyperlipemia      Assessment: 35 yoF with chest pain and elevated troponin to start IV heparin.   Heparin level therapeutic at 0.31, though downtrended, on heparin 750 units/hr. Hb stable. No signs/symptoms of bleeding or issues with infusion reported by nursing.     Goal of Therapy:  Heparin level 0.3-0.7 units/ml Monitor platelets by anticoagulation protocol: Yes    Plan:  Increase heparin gtt to 800 units/hr Check 8 hr heparin level Daily heparin level and CBC   Claiborne Billings, PharmD PGY2 Cardiology Pharmacy Resident Phone [phone] Please check AMION for all Pharmacist numbers by unit 04/25/2018 9:36 AM

## 2018-04-25 NOTE — Progress Notes (Addendum)
Progress Note  Patient Name: Julie Petty Date of Encounter: 04/25/2018  Primary Cardiologist: Sanda Klein, MD  Subjective   Quiet night. Remains CP free. BP at 0000 listed as 73 systolic; discussed with nursing to repeat this AM - 99/58. Pt resting comfortably.  Tele does show NSR/SB HR nadir 48, intermittent brief ?junctional tach, also brief ventricular bigeminy  Inpatient Medications    Scheduled Meds: . [START ON 04/26/2018] aspirin  81 mg Oral Pre-Cath  . aspirin EC  81 mg Oral Daily  . atorvastatin  40 mg Oral QPM  . brinzolamide  1 drop Right Eye BID  . [START ON 04/26/2018] calcium carbonate  625 mg Oral QODAY  . metoprolol tartrate  12.5 mg Oral BID  . sodium chloride flush  3 mL Intravenous Q12H  . sodium chloride flush  3 mL Intravenous Q12H   Continuous Infusions: . sodium chloride    . sodium chloride    . [START ON 04/26/2018] sodium chloride     Followed by  . [START ON 04/26/2018] sodium chloride    . heparin 750 Units/hr (04/24/18 1613)   PRN Meds: sodium chloride, sodium chloride, acetaminophen, nitroGLYCERIN, ondansetron (ZOFRAN) IV, sodium chloride flush, sodium chloride flush   Vital Signs    Vitals:   04/24/18 2026 04/25/18 0000 04/25/18 0438 04/25/18 0700  BP: 104/89 (!) 75/38    Pulse: 80 (!) 50  (!) 57  Resp:  (!) 23  17  Temp: 97.6 F (36.4 C) 98.3 F (36.8 C) 98.3 F (36.8 C)   TempSrc: Oral Oral Oral   SpO2: 95% 93%  98%  Weight: 59.1 kg     Height:        Intake/Output Summary (Last 24 hours) at 04/25/2018 0855 Last data filed at 04/25/2018 0749 Gross per 24 hour  Intake 202.5 ml  Output 400 ml  Net -197.5 ml   Filed Weights   04/24/18 1441 04/24/18 2026  Weight: 59.9 kg 59.1 kg    Telemetry    Tele does show NSR/SB HR nadir 48, intermittent brief ?junctional tach, also brief ventricular bigeminy - Personally Reviewed  ECG    SB RBBB upsloped ST segment V2 (similar to last EKG), new TWI V4-V6- Personally  Reviewed  Physical Exam   GEN: No acute distress.  HEENT: Normocephalic, atraumatic, sclera non-icteric. Neck: No JVD or bruits. Cardiac: RRR no murmurs, rubs, or gallops.  Radials/DP/PT 1+ and equal bilaterally.  Respiratory: Clear to auscultation bilaterally. Breathing is unlabored. GI: Soft, nontender, non-distended, BS +x 4. MS: no deformity. Extremities: No clubbing or cyanosis. No edema. Distal pedal pulses are 2+ and equal bilaterally. Neuro:  AAOx3. Follows commands. Psych:  Responds to questions appropriately with a normal affect.  Labs    Chemistry Recent Labs  Lab 04/24/18 1320 04/24/18 2058 04/25/18 0009  NA 137  --  135  K 4.7  --  3.7  CL 104  --  104  CO2 24  --  25  GLUCOSE 134*  --  97  BUN 23  --  18  CREATININE 1.10*  --  0.90  CALCIUM 9.4  --  8.8*  PROT  --  6.8  --   ALBUMIN  --  3.7  --   AST  --  32  --   ALT  --  16  --   ALKPHOS  --  45  --   BILITOT  --  0.9  --   GFRNONAA 45*  --  57*  GFRAA 52*  --  >60  ANIONGAP 9  --  6     Hematology Recent Labs  Lab 04/24/18 1320 04/25/18 0009  WBC 6.5 7.6  RBC 4.30 3.93  HGB 13.6 12.4  HCT 43.4 38.9  MCV 100.9* 99.0  MCH 31.6 31.6  MCHC 31.3 31.9  RDW 13.0 13.1  PLT 219 231    Cardiac Enzymes Recent Labs  Lab 04/24/18 1520 04/24/18 2058 04/25/18 0009  TROPONINI 1.12* 3.34* 3.96*    Recent Labs  Lab 04/24/18 1337  TROPIPOC 0.25*     BNPNo results for input(s): BNP, PROBNP in the last 168 hours.   DDimer No results for input(s): DDIMER in the last 168 hours.   Radiology    Dg Chest Port 1 View  Result Date: 04/24/2018 CLINICAL DATA:  Left-sided chest pain for 4 hours. No shortness of breath. EXAM: PORTABLE CHEST 1 VIEW COMPARISON:  12/16/2010 FINDINGS: There is bilateral mild interstitial thickening. There is no focal consolidation. There is no pleural effusion or pneumothorax. There is stable cardiomegaly. There is thoracic aortic atherosclerosis. The osseous structures  are unremarkable. IMPRESSION: 1. No acute cardiopulmonary disease. 2. Cardiomegaly. Electronically Signed   By: Kathreen Devoid   On: 04/24/2018 15:26    Cardiac Studies   pending  Patient Profile     84 y.o. very active female with HTN, HLD, GERD, glaucoma admitted with CP and found to have NSTEMI.  Assessment & Plan    1. Chest pain/probable NSTEMI - troponin 0.25->1.12->3.34->3.96. Remains CP free. EKG with some dynamic changes overnight but patient is asymptomatic. Continue heparin, ASA, titrated statin. Will hold AM BB today and resume this PM at lower dose 12.5mg  BID with hold parameters in place. Plan cath tomorrow. I did place on add-on board. Risks and benefits of cardiac catheterization have been discussed with the patient.  These include bleeding, infection, kidney damage, stroke, heart attack, death.  The patient understands these risks and is willing to proceed. Orders placed. Echo pending.  2. Essential HTN - BP soft overnight and this AM. See changes above to BB. Hold amlodipine.  3. Hyperlipidemia - LDL 117, atorva titrated. If the patient is tolerating statin at time of follow-up appointment, would consider rechecking liver function/lipid panel in 6-8 weeks.  4. Arrhythmia - will also ask MD to review tele - ? Junctional tach briefly, also with SB and brief ventricular bigeminy. Given HR 50 before any further BB, adjusting med as above.  5. Hyperglycemia - glucose 134 on arrival but A1C WNL.  6. Mild renal insufficiency - Cr 1.10 on arrival but 0.9 today.   For questions or updates, please contact [place] Please consult [email or web address] for contact info under Cardiology/STEMI.  Signed, Charlie Pitter, PA-C 04/25/2018, 8:55 AM    I have seen and examined the patient along with Charlie Pitter, PA-C.  I have reviewed the chart, notes and new data.  I agree with PA's note.  Key new complaints: At night, no cardiovascular complaints. Key examination changes:  Cardiovascular exam Key new findings / data: Troponin seems to have peaked at approximately 4.. Note paroxysmal atrial tachycardia on monitor overnight.  Some PVCs.  Now on beta-blockers.  Mild hypotension as we have transitioned her antihypertensive from amlodipine to beta-blocker.  PLAN: Discussed cardiac catheterization and possible angioplasty stent in detail with the patient and her son.  Reviewed pros and cons of the procedure, technical details, possible complications and outcomes.This procedure has been fully reviewed  with the patient and written informed consent has been obtained.   Sanda Klein, MD, [place] [phone] 04/25/2018, 10:06 AM

## 2018-04-26 ENCOUNTER — Encounter (HOSPITAL_COMMUNITY): Payer: Self-pay | Admitting: Cardiology

## 2018-04-26 ENCOUNTER — Encounter (HOSPITAL_COMMUNITY)
Admission: EM | Disposition: A | Discharge status: Home / Self Care | Payer: Self-pay | Source: Home / Self Care | Attending: Cardiovascular Disease

## 2018-04-26 DIAGNOSIS — I2510 Atherosclerotic heart disease of native coronary artery without angina pectoris: Secondary | ICD-10-CM

## 2018-04-26 DIAGNOSIS — E785 Hyperlipidemia, unspecified: Secondary | ICD-10-CM

## 2018-04-26 DIAGNOSIS — I429 Cardiomyopathy, unspecified: Secondary | ICD-10-CM

## 2018-04-26 HISTORY — PX: LEFT HEART CATH AND CORONARY ANGIOGRAPHY: CATH118249

## 2018-04-26 LAB — CBC
HEMATOCRIT: 40.4 % (ref 36.0–46.0)
HEMOGLOBIN: 12.2 g/dL (ref 12.0–15.0)
MCH: 30.4 pg (ref 26.0–34.0)
MCHC: 30.2 g/dL (ref 30.0–36.0)
MCV: 100.7 fL — ABNORMAL HIGH (ref 80.0–100.0)
Platelets: 218 10*3/uL (ref 150–400)
RBC: 4.01 MIL/uL (ref 3.87–5.11)
RDW: 13.2 % (ref 11.5–15.5)
WBC: 5.4 10*3/uL (ref 4.0–10.5)
nRBC: 0 % (ref 0.0–0.2)

## 2018-04-26 LAB — BASIC METABOLIC PANEL
Anion gap: 3 — ABNORMAL LOW (ref 5–15)
BUN: 18 mg/dL (ref 8–23)
CHLORIDE: 107 mmol/L (ref 98–111)
CO2: 28 mmol/L (ref 22–32)
CREATININE: 1.04 mg/dL — AB (ref 0.44–1.00)
Calcium: 8.7 mg/dL — ABNORMAL LOW (ref 8.9–10.3)
GFR calc Af Amer: 56 mL/min — ABNORMAL LOW (ref >60)
GFR calc non Af Amer: 48 mL/min — ABNORMAL LOW (ref >60)
Glucose, Bld: 100 mg/dL — ABNORMAL HIGH (ref 70–99)
Potassium: 4.1 mmol/L (ref 3.5–5.1)
SODIUM: 138 mmol/L (ref 135–145)

## 2018-04-26 LAB — HEPARIN LEVEL (UNFRACTIONATED): HEPARIN UNFRACTIONATED: 0.39 [IU]/mL (ref 0.30–0.70)

## 2018-04-26 SURGERY — LEFT HEART CATH AND CORONARY ANGIOGRAPHY
Anesthesia: LOCAL

## 2018-04-26 MED ORDER — FENTANYL CITRATE (PF) 100 MCG/2ML IJ SOLN
INTRAMUSCULAR | Status: DC | PRN
  Administered 2018-04-26: 25 ug via INTRAVENOUS

## 2018-04-26 MED ORDER — MIDAZOLAM HCL 2 MG/2ML IJ SOLN
INTRAMUSCULAR | Status: AC
  Filled 2018-04-26: qty 2

## 2018-04-26 MED ORDER — HEPARIN (PORCINE) IN NACL 1000-0.9 UT/500ML-% IV SOLN
INTRAVENOUS | Status: DC | PRN
  Administered 2018-04-26 (×2): 500 mL

## 2018-04-26 MED ORDER — FENTANYL CITRATE (PF) 100 MCG/2ML IJ SOLN
INTRAMUSCULAR | Status: AC
  Filled 2018-04-26: qty 2

## 2018-04-26 MED ORDER — IOHEXOL 350 MG/ML SOLN
INTRAVENOUS | Status: DC | PRN
  Administered 2018-04-26: 40 mL via INTRAVENOUS

## 2018-04-26 MED ORDER — ROSUVASTATIN CALCIUM 20 MG PO TABS
20.0000 mg | ORAL_TABLET | Freq: Every day | ORAL | Status: DC
  Administered 2018-04-26 – 2018-04-27 (×2): 20 mg via ORAL
  Filled 2018-04-26 (×2): qty 1

## 2018-04-26 MED ORDER — LIDOCAINE HCL (PF) 1 % IJ SOLN
INTRAMUSCULAR | Status: AC
  Filled 2018-04-26: qty 30

## 2018-04-26 MED ORDER — HEPARIN SODIUM (PORCINE) 1000 UNIT/ML IJ SOLN
INTRAMUSCULAR | Status: DC | PRN
  Administered 2018-04-26: 3000 [IU] via INTRAVENOUS

## 2018-04-26 MED ORDER — SODIUM CHLORIDE 0.9 % IV SOLN: INTRAVENOUS | Status: AC

## 2018-04-26 MED ORDER — VERAPAMIL HCL 2.5 MG/ML IV SOLN
INTRAVENOUS | Status: AC
  Filled 2018-04-26: qty 2

## 2018-04-26 MED ORDER — VERAPAMIL HCL 2.5 MG/ML IV SOLN
INTRAVENOUS | Status: DC | PRN
  Administered 2018-04-26: 5 mL via INTRA_ARTERIAL

## 2018-04-26 MED ORDER — LIDOCAINE HCL (PF) 1 % IJ SOLN
INTRAMUSCULAR | Status: DC | PRN
  Administered 2018-04-26: 2 mL

## 2018-04-26 MED ORDER — SODIUM CHLORIDE 0.9 % IV SOLN: 250.0000 mL | INTRAVENOUS | Status: DC | PRN

## 2018-04-26 MED ORDER — SODIUM CHLORIDE 0.9% FLUSH
3.0000 mL | Freq: Two times a day (BID) | INTRAVENOUS | Status: DC
  Administered 2018-04-27 (×2): 3 mL via INTRAVENOUS

## 2018-04-26 MED ORDER — SODIUM CHLORIDE 0.9% FLUSH: 3.0000 mL | INTRAVENOUS | Status: DC | PRN

## 2018-04-26 MED ORDER — MIDAZOLAM HCL 2 MG/2ML IJ SOLN
INTRAMUSCULAR | Status: DC | PRN
  Administered 2018-04-26: 1 mg via INTRAVENOUS

## 2018-04-26 MED ORDER — HEPARIN (PORCINE) IN NACL 1000-0.9 UT/500ML-% IV SOLN
INTRAVENOUS | Status: AC
  Filled 2018-04-26: qty 1000

## 2018-04-26 SURGICAL SUPPLY — 12 items
CATH OPTITORQUE TIG 4.0 5F (CATHETERS) ×1 IMPLANT
DEVICE RAD COMP TR BAND LRG (VASCULAR PRODUCTS) IMPLANT
DEVICE RAD TR BAND REGULAR (VASCULAR PRODUCTS) ×1 IMPLANT
GLIDESHEATH SLEND A-KIT 6F 22G (SHEATH) IMPLANT
GUIDEWIRE INQWIRE 1.5J.035X260 (WIRE) IMPLANT
INQWIRE 1.5J .035X260CM (WIRE) ×2
KIT HEART LEFT (KITS) ×2 IMPLANT
PACK CARDIAC CATHETERIZATION (CUSTOM PROCEDURE TRAY) ×2 IMPLANT
SHEATH GLIDE SLENDER 4/5FR (SHEATH) ×1 IMPLANT
SHEATH PROBE COVER 6X72 (BAG) ×1 IMPLANT
TRANSDUCER W/STOPCOCK (MISCELLANEOUS) ×2 IMPLANT
TUBING CIL FLEX 10 FLL-RA (TUBING) ×2 IMPLANT

## 2018-04-26 NOTE — Progress Notes (Signed)
TR Band removed, 2x2 gauze and tegaderm applied, no bleeding noted continue monitor.

## 2018-04-26 NOTE — Progress Notes (Signed)
Transported to the cath. Lab by bed, awake and alert.

## 2018-04-26 NOTE — Interval H&P Note (Signed)
History and Physical Interval Note:  04/26/2018 11:56 AM  Julie Petty  has presented today for surgery, with the diagnosis of NSTEMI.  She has remained stable overnight with no further chest pain.    The various methods of treatment have been discussed with the patient and family. After consideration of risks, benefits and other options for treatment, the patient has consented to  Procedure(s): LEFT HEART CATH AND CORONARY ANGIOGRAPHY (N/A) with possible PERCUTANEOUS CORONARY INTERVENTION as a surgical intervention .  The patient's history has been reviewed, patient examined, no change in status, stable for surgery.  I have reviewed the patient's chart and labs.  Questions were answered to the patient's satisfaction.    Cath Lab Visit (complete for each Cath Lab visit)  Clinical Evaluation Leading to the Procedure:   ACS: Yes.    Non-ACS:    Anginal Classification: CCS III  Anti-ischemic medical therapy: Minimal Therapy (1 class of medications)  Non-Invasive Test Results: No non-invasive testing performed  Prior CABG: No previous CABG   Glenetta Hew

## 2018-04-26 NOTE — Progress Notes (Signed)
Back from the cath lab awake and alert.t. TR Band to right  Wrist intact elevated with pillow, pulse  ox to right thumb.

## 2018-04-26 NOTE — Progress Notes (Signed)
Transported to the cath lab by bed wake and alert.

## 2018-04-26 NOTE — Progress Notes (Signed)
Back from the cath lab, transporter claimed that cath lab not yet ready for her . To come back after 1 hour.

## 2018-04-26 NOTE — Progress Notes (Signed)
ANTICOAGULATION CONSULT NOTE - Follow up consult  Pharmacy Consult:  Heparin Indication: chest pain/ACS  Allergies  Allergen Reactions  . Codeine Nausea Only  . Sulfur Nausea Only    Patient Measurements: Height: 5' 2.5" (158.8 cm) Weight: 130 lb 4.7 oz (59.1 kg) IBW/kg (Calculated) : 51.25 Heparin Dosing Weight: 60 kg  Vital Signs: Temp: 98.1 F (36.7 C) (11/04 0739) Temp Source: Oral (11/04 0739) BP: 130/77 (11/04 0739) Pulse Rate: 62 (11/04 0739)  Labs: Recent Labs    04/24/18 1320 04/24/18 1520 04/24/18 2058 04/25/18 0009 04/25/18 0806 04/26/18 0232  HGB 13.6  --   --  12.4  --  12.2  HCT 43.4  --   --  38.9  --  40.4  PLT 219  --   --  231  --  218  HEPARINUNFRC  --   --   --  0.45 0.31 0.39  CREATININE 1.10*  --   --  0.90  --  1.04*  TROPONINI  --  1.12* 3.34* 3.96*  --   --     Estimated Creatinine Clearance: 32.6 mL/min (A) (by C-G formula based on SCr of 1.04 mg/dL (H)).   Medical History: Past Medical History:  Diagnosis Date  . Essential hypertension   . GERD (gastroesophageal reflux disease)   . Glaucoma   . Hernia   . Hyperlipemia      Assessment: 49 yoF with chest pain and elevated troponin to start IV heparin. No anticoag PTA.  Heparin level therapeutic at 0.39, on 800 units/hr. Hgb 12.2, plt 218. No s/sx of bleeding. No infusion issues documented per nursing.     Goal of Therapy:  Heparin level 0.3-0.7 units/ml Monitor platelets by anticoagulation protocol: Yes    Plan:  Continue heparin gtt at 800 units/hr Daily heparin level and CBC F/u after cath   Doylene Canard, PharmD Clinical Pharmacist  Pager: [phone] Phone: [phone] Please check AMION for all Pharmacist numbers by unit 04/26/2018 8:33 AM

## 2018-04-26 NOTE — Progress Notes (Signed)
Progress Note  Patient Name: Julie Petty Date of Encounter: 04/26/2018  Primary Cardiologist: Croitoru  Subjective   Saw patient briefly before she was wheeled to cath lab.  She is not currently having chest pain or distress, no SOB.    Inpatient Medications    Scheduled Meds: . aspirin EC  81 mg Oral Daily  . atorvastatin  40 mg Oral QPM  . brinzolamide  1 drop Right Eye BID  . calcium carbonate  625 mg Oral QODAY  . metoprolol tartrate  12.5 mg Oral BID  . sodium chloride flush  3 mL Intravenous Q12H  . sodium chloride flush  3 mL Intravenous Q12H   Continuous Infusions: . sodium chloride    . sodium chloride    . sodium chloride 1 mL/kg/hr (04/26/18 0519)  . heparin Stopped (04/26/18 1100)   PRN Meds: sodium chloride, sodium chloride, acetaminophen, nitroGLYCERIN, ondansetron (ZOFRAN) IV, sodium chloride flush, sodium chloride flush   Vital Signs    Vitals:   04/25/18 2215 04/25/18 2239 04/26/18 0412 04/26/18 0739  BP: 98/62 (!) 107/57 (!) 103/58 130/77  Pulse: (!) 56 (!) 55 (!) 58 62  Resp:  19 13 (!) 22  Temp:  97.6 F (36.4 C) 97.9 F (36.6 C) 98.1 F (36.7 C)  TempSrc:  Oral Oral Oral  SpO2:  95% 100% 96%  Weight:      Height:        Intake/Output Summary (Last 24 hours) at 04/26/2018 1121 Last data filed at 04/26/2018 1100 Gross per 24 hour  Intake 1052.3 ml  Output 350 ml  Net 702.3 ml    I/O since admission:   Filed Weights   04/24/18 1441 04/24/18 2026 04/25/18 0855  Weight: 59.9 kg 59.1 kg 59.1 kg    Telemetry    Sinus brady - Personally Reviewed  ECG    ECG (independently read by me): 11/3: sinus brady, septal infarct not acute  Physical Exam    BP 130/77 (BP Location: Right Arm)   Pulse 62   Temp 98.1 F (36.7 C) (Oral)   Resp (!) 22   Ht 5' 2.5" (1.588 m)   Wt 59.1 kg   SpO2 96%   BMI 23.45 kg/m  General: Alert, oriented, no distress.  Skin: normal turgor, no rashes, warm and dry HEENT: Normocephalic,  atraumatic. Pupils equal round and reactive to light; sclera anicteric; extraocular muscles intact; Nose without nasal septal hypertrophy Mouth/Parynx benign; Mallinpatti scale Neck: minimal if any JVD, no carotid bruits; normal carotid upstroke Lungs: coarse breath sounds throughout; no wheezing or rales Chest wall: without tenderness to palpitation Heart: PMI not displaced, bradycardia, S3 gallop appreciated, no rubs, thrills, or heaves Abdomen: soft, nontender; no hepatosplenomehaly, BS+; abdominal aorta nontender and not dilated by palpation. Back: no CVA tenderness Pulses 2+ Musculoskeletal: full range of motion, normal strength, no joint deformities Extremities: no clubbing cyanosis or edema,  Neurologic: grossly nonfocal; Cranial nerves grossly wnl Psychologic: Normal mood and affect     Labs    Chemistry Recent Labs  Lab 04/24/18 1320 04/24/18 2058 04/25/18 0009 04/26/18 0232  NA 137  --  135 138  K 4.7  --  3.7 4.1  CL 104  --  104 107  CO2 24  --  25 28  GLUCOSE 134*  --  97 100*  BUN 23  --  18 18  CREATININE 1.10*  --  0.90 1.04*  CALCIUM 9.4  --  8.8* 8.7*  PROT  --  6.8  --   --   ALBUMIN  --  3.7  --   --   AST  --  32  --   --   ALT  --  16  --   --   ALKPHOS  --  45  --   --   BILITOT  --  0.9  --   --   GFRNONAA 45*  --  57* 48*  GFRAA 52*  --  >60 56*  ANIONGAP 9  --  6 3*     Hematology Recent Labs  Lab 04/24/18 1320 04/25/18 0009 04/26/18 0232  WBC 6.5 7.6 5.4  RBC 4.30 3.93 4.01  HGB 13.6 12.4 12.2  HCT 43.4 38.9 40.4  MCV 100.9* 99.0 100.7*  MCH 31.6 31.6 30.4  MCHC 31.3 31.9 30.2  RDW 13.0 13.1 13.2  PLT 219 231 218    Cardiac Enzymes Recent Labs  Lab 04/24/18 1520 04/24/18 2058 04/25/18 0009  TROPONINI 1.12* 3.34* 3.96*    Recent Labs  Lab 04/24/18 1337  TROPIPOC 0.25*     BNPNo results for input(s): BNP, PROBNP in the last 168 hours.   DDimer No results for input(s): DDIMER in the last 168 hours.   Lipid Panel       Component Value Date/Time   CHOL 174 04/25/2018 0009   TRIG 32 04/25/2018 0009   HDL 51 04/25/2018 0009   CHOLHDL 3.4 04/25/2018 0009   VLDL 6 04/25/2018 0009   LDLCALC 117 (H) 04/25/2018 0009     Radiology    Dg Chest Port 1 View  Result Date: 04/24/2018 CLINICAL DATA:  Left-sided chest pain for 4 hours. No shortness of breath. EXAM: PORTABLE CHEST 1 VIEW COMPARISON:  12/16/2010 FINDINGS: There is bilateral mild interstitial thickening. There is no focal consolidation. There is no pleural effusion or pneumothorax. There is stable cardiomegaly. There is thoracic aortic atherosclerosis. The osseous structures are unremarkable. IMPRESSION: 1. No acute cardiopulmonary disease. 2. Cardiomegaly. Electronically Signed   By: Kathreen Devoid   On: 04/24/2018 15:26    Cardiac Studies   LV EF: 50% -   55%  ------------------------------------------------------------------- Indications:      (Elevated troponin).  ------------------------------------------------------------------- History:   PMH:  NSTEMI  Risk factors:  Hypertension. Dyslipidemia.   ------------------------------------------------------------------- Study Conclusions  - Left ventricle: The cavity size was normal. Wall thickness was   normal. Systolic function was normal. The estimated ejection   fraction was in the range of 50% to 55%. Distal anterior, apical   and inferoapical severe hypokinesis with a hyperdynamic base, may   suggest Takatsubo cardiomyopathy or LAD ischemia. No apical   thrombus by Definity contrast. Doppler parameters are consistent   with abnormal left ventricular relaxation (grade 1 diastolic   dysfunction). The E/e&' ratio is between 8-15, suggesting   indeterminate LV filling pressure. Ejection fraction (MOD,   2-plane): 50%. - Aortic valve: Trileaflet. Sclerosis without stenosis. There was   mild regurgitation. - Mitral valve: Mildly thickened leaflets . There was trivial    regurgitation. - Left atrium: Moderately dilated. - Right atrium: The atrium was mildly dilated. - Tricuspid valve: There was moderate regurgitation. - Pulmonary arteries: PA peak pressure: 37 mm Hg (S). - Systemic veins: The IVC measures <2.1 cm, but does not collapse   >50%, suggesting an elevated RA pressure of 8 mmHg.  Impressions:  - LVEF 50-55%, normal wall thickness, distal anterior, apical and   inferoapical severe hypokinesis with a hyperdynamic base,  suggestive of possible Takatsubo cardiomyopathy vs LAD ischemia,   grade 1 DD, indetermiante LV filling pressure, mild AI, trivial   MR, moderate LAE, mild RAE, moderate TR, RVSP 37 mmHg, IVC   suggests elevated RA pressure of 8 mmHg.  Diagnostic Diagram        Patient Profile     84 y.o. female  with PMH of HTN & HLD, GERD, glaucoma with no prior cardiac hx. While shopping she developed left sided chest pain radiating to her jaw.  Went home later decided to call EMS who gave her asa 324 SL NTG, pain subsided in ER.  Last troponin 3.96 admitted for NSTEMI started on heparin.   Minimal CAD noted on cath 11/4.    Assessment & Plan    1. NSTEMI: going for cath this am, Last troponin 3.96.  S3 gallop on exam this morning but minimal JVP, coarse breath sounds but no rales  -already started on asa, heparin, home atorvastatin 40mg , metoprolol tartrate 12.5mg  BID -will switch lipitor to crestor 20mg  -cath this morning results show minimal coronary disease; Ost 2nd Diag lesion is 40% stenosed  2. HTN: bp soft currently on metoprolol 12.5mg  BID  -continue metoprolol -when bp improves can start low dose losartan  3. Takotsubo Cardiomyopathy: volume status stable on exam, S3 appreciated.  ECHO reviewed personally, apical ballooning present.  Cath results show minimal CAD, no LV gram performed  -continue to monitor closely for signs of volume overload -will add losartan tomorrow as bp allows  Signed, Vickki Muff  MD PGY-2 Internal Medicine 04/26/2018, 11:21 AM     Patient seen and examined. Agree with assessment and plan.  I have reviewed the patient's cardiac catheterization angiograms and cath report by Dr. Ellyn Hack, as well as the 2D echo Doppler which suggest apical ballooning but still showed low normal EF at 50 to 55%.  I had a lengthy discussion with the patient and her family members regarding various etiologies.  She did not have coronary obstructive disease.  Differential diagnosis includes Takotsubu cardiomyopathy, transient coronary vasospasm, or possible myocarditis.  Presently she is being hydrated post procedure.  Blood pressure is 95/64.  We will plan to initiate additional medical therapy tomorrow with ACE-I or ARB therapy as blood pressure allows.  She has been started on low-dose beta-blocker and may want to change to carvedilol.  LDL is elevated despite being on atorvastatin 40 mg.  Can consider switching to rosuvastatin or alternatively add Zetia 10 mg to her current dose of atorvastatin.    Troy Sine, MD, [facility] 04/26/2018 2:16 PM

## 2018-04-27 ENCOUNTER — Encounter (HOSPITAL_COMMUNITY): Payer: Self-pay | Admitting: *Deleted

## 2018-04-27 LAB — BASIC METABOLIC PANEL
Anion gap: 5 (ref 5–15)
BUN: 18 mg/dL (ref 8–23)
CO2: 24 mmol/L (ref 22–32)
CREATININE: 1.03 mg/dL — AB (ref 0.44–1.00)
Calcium: 8.7 mg/dL — ABNORMAL LOW (ref 8.9–10.3)
Chloride: 108 mmol/L (ref 98–111)
GFR calc non Af Amer: 49 mL/min — ABNORMAL LOW (ref >60)
GFR, EST AFRICAN AMERICAN: 56 mL/min — AB (ref >60)
Glucose, Bld: 105 mg/dL — ABNORMAL HIGH (ref 70–99)
Potassium: 4.2 mmol/L (ref 3.5–5.1)
Sodium: 137 mmol/L (ref 135–145)

## 2018-04-27 LAB — CBC
HCT: 37.8 % (ref 36.0–46.0)
Hemoglobin: 11.7 g/dL — ABNORMAL LOW (ref 12.0–15.0)
MCH: 30.9 pg (ref 26.0–34.0)
MCHC: 31 g/dL (ref 30.0–36.0)
MCV: 99.7 fL (ref 80.0–100.0)
PLATELETS: 201 10*3/uL (ref 150–400)
RBC: 3.79 MIL/uL — ABNORMAL LOW (ref 3.87–5.11)
RDW: 13.2 % (ref 11.5–15.5)
WBC: 6.1 10*3/uL (ref 4.0–10.5)
nRBC: 0 % (ref 0.0–0.2)

## 2018-04-27 MED ORDER — METOPROLOL TARTRATE 12.5 MG HALF TABLET: 12.5000 mg | ORAL_TABLET | Freq: Two times a day (BID) | ORAL | Status: AC

## 2018-04-27 MED ORDER — METOPROLOL SUCCINATE ER 25 MG PO TB24
12.5000 mg | ORAL_TABLET | Freq: Every day | ORAL | Status: DC
  Administered 2018-04-28: 12.5 mg via ORAL
  Filled 2018-04-27: qty 1

## 2018-04-27 MED ORDER — AMLODIPINE BESYLATE 2.5 MG PO TABS
2.5000 mg | ORAL_TABLET | Freq: Every day | ORAL | Status: DC
  Administered 2018-04-27 – 2018-04-28 (×2): 2.5 mg via ORAL
  Filled 2018-04-27 (×2): qty 1

## 2018-04-27 NOTE — Progress Notes (Addendum)
Progress Note  Patient Name: Julie Petty Date of Encounter: 04/27/2018  Primary Cardiologist: Croitoru  Subjective   Pt feeling well today, was walking the halls, worked with cardiac rehab today.  She did very well, no cp or SOB.  She talked to me about some of the stressors she has had recently.    Inpatient Medications    Scheduled Meds: . aspirin EC  81 mg Oral Daily  . brinzolamide  1 drop Right Eye BID  . calcium carbonate  625 mg Oral QODAY  . metoprolol tartrate  12.5 mg Oral BID  . rosuvastatin  20 mg Oral q1800  . sodium chloride flush  3 mL Intravenous Q12H  . sodium chloride flush  3 mL Intravenous Q12H   Continuous Infusions: . sodium chloride    . sodium chloride     PRN Meds: sodium chloride, sodium chloride, acetaminophen, nitroGLYCERIN, ondansetron (ZOFRAN) IV, sodium chloride flush, sodium chloride flush   Vital Signs    Vitals:   04/27/18 0402 04/27/18 0801 04/27/18 0906 04/27/18 1200  BP: 93/65 118/78 (!) 158/70 99/61  Pulse: 69 91  (!) 56  Resp: 18 15  16   Temp: 97.9 F (36.6 C) 97.6 F (36.4 C)  97.8 F (36.6 C)  TempSrc: Oral Oral  Oral  SpO2: 97% 98%  98%  Weight:      Height:        Intake/Output Summary (Last 24 hours) at 04/27/2018 1325 Last data filed at 04/27/2018 0300 Gross per 24 hour  Intake 503 ml  Output 350 ml  Net 153 ml    I/O since admission:   Filed Weights   04/24/18 1441 04/24/18 2026 04/25/18 0855  Weight: 59.9 kg 59.1 kg 59.1 kg    Telemetry    Occasional sinus brady, sinus arrythmia - Personally Reviewed  ECG    ECG (independently read by me): sinus arrythmia, some deepening T wave inversions in lateral leads compared with ecg of 11/3  Physical Exam    BP 99/61 (BP Location: Left Arm)   Pulse (!) 56   Temp 97.8 F (36.6 C) (Oral)   Resp 16   Ht 5' 2.5" (1.588 m)   Wt 59.1 kg   SpO2 98%   BMI 23.45 kg/m  General: Alert, oriented, no distress.  Skin: normal turgor, no rashes, warm and  dry HEENT: Normocephalic, atraumatic. Pupils equal round and reactive to light; sclera anicteric; extraocular muscles intact;  Nose without nasal septal hypertrophy Neck: No JVD, no carotid bruits; normal carotid upstroke Lungs: coarse breath sounds throughout; no wheezing or rales Chest wall: without tenderness to palpitation Heart: PMI not displaced, sinus arrhythmia appreciated, no rubs, gallops, thrills, or heaves Abdomen: soft, nontender; no hepatosplenomehaly, BS+; abdominal aorta nontender and not dilated by palpation. Extremities: no clubbing cyanosis or edema  Neurologic: grossly nonfocal; Cranial nerves grossly wnl Psychologic: Normal mood and affect     Labs    Chemistry Recent Labs  Lab 04/24/18 2058 04/25/18 0009 04/26/18 0232 04/27/18 0256  NA  --  135 138 137  K  --  3.7 4.1 4.2  CL  --  104 107 108  CO2  --  25 28 24   GLUCOSE  --  97 100* 105*  BUN  --  18 18 18   CREATININE  --  0.90 1.04* 1.03*  CALCIUM  --  8.8* 8.7* 8.7*  PROT 6.8  --   --   --   ALBUMIN 3.7  --   --   --  AST 32  --   --   --   ALT 16  --   --   --   ALKPHOS 45  --   --   --   BILITOT 0.9  --   --   --   GFRNONAA  --  57* 48* 49*  GFRAA  --  >60 56* 56*  ANIONGAP  --  6 3* 5     Hematology Recent Labs  Lab 04/25/18 0009 04/26/18 0232 04/27/18 0256  WBC 7.6 5.4 6.1  RBC 3.93 4.01 3.79*  HGB 12.4 12.2 11.7*  HCT 38.9 40.4 37.8  MCV 99.0 100.7* 99.7  MCH 31.6 30.4 30.9  MCHC 31.9 30.2 31.0  RDW 13.1 13.2 13.2  PLT 231 218 201    Cardiac Enzymes Recent Labs  Lab 04/24/18 1520 04/24/18 2058 04/25/18 0009  TROPONINI 1.12* 3.34* 3.96*    Recent Labs  Lab 04/24/18 1337  TROPIPOC 0.25*     BNPNo results for input(s): BNP, PROBNP in the last 168 hours.   DDimer No results for input(s): DDIMER in the last 168 hours.   Lipid Panel     Component Value Date/Time   CHOL 174 04/25/2018 0009   TRIG 32 04/25/2018 0009   HDL 51 04/25/2018 0009   CHOLHDL 3.4  04/25/2018 0009   VLDL 6 04/25/2018 0009   LDLCALC 117 (H) 04/25/2018 0009     Radiology    No results found.  Cardiac Studies   LV EF: 50% - 55%  ------------------------------------------------------------------- Indications: (Elevated troponin).  ------------------------------------------------------------------- History: PMH: NSTEMI Risk factors: Hypertension. Dyslipidemia.  ------------------------------------------------------------------- Study Conclusions  - Left ventricle: The cavity size was normal. Wall thickness was normal. Systolic function was normal. The estimated ejection fraction was in the range of 50% to 55%. Distal anterior, apical and inferoapical severe hypokinesis with a hyperdynamic base, may suggest Takatsubo cardiomyopathy or LAD ischemia. No apical thrombus by Definity contrast. Doppler parameters are consistent with abnormal left ventricular relaxation (grade 1 diastolic dysfunction). The E/e&' ratio is between 8-15, suggesting indeterminate LV filling pressure. Ejection fraction (MOD, 2-plane): 50%. - Aortic valve: Trileaflet. Sclerosis without stenosis. There was mild regurgitation. - Mitral valve: Mildly thickened leaflets . There was trivial regurgitation. - Left atrium: Moderately dilated. - Right atrium: The atrium was mildly dilated. - Tricuspid valve: There was moderate regurgitation. - Pulmonary arteries: PA peak pressure: 37 mm Hg (S). - Systemic veins: The IVC measures <2.1 cm, but does not collapse >50%, suggesting an elevated RA pressure of 8 mmHg.  Impressions:  - LVEF 50-55%, normal wall thickness, distal anterior, apical and inferoapical severe hypokinesis with a hyperdynamic base, suggestive of possible Takatsubo cardiomyopathy vs LAD ischemia, grade 1 DD, indetermiante LV filling pressure, mild AI, trivial MR, moderate LAE, mild RAE, moderate TR, RVSP 37 mmHg,  IVC suggests elevated RA pressure of 8 mmHg.  Diagnostic Diagram         Patient Profile     84 y.o. female 84 y.o. female  with PMH of HTN & HLD, GERD, glaucoma with no prior cardiac hx. While shopping she developed left sided chest pain radiating to her jaw.  Went home later decided to call EMS who gave her asa 324 SL NTG, pain subsided in ER.  Last troponin 3.96 admitted for NSTEMI started on heparin.   Minimal CAD noted on cath 11/4. ECHO shows apical ballooning. Presentation consistent with takotsubo cardiomyopathy.     Assessment & Plan    1. Takotsubo Cardiomyopathy VS  Coronary Vasospasm: volume status stable on exam  ECHO reviewed personally, apical ballooning present.  Cath results show minimal CAD, no LV gram performed.  ECG T waves evolving much more inverted today   -will switch patient to metoprolol succinate tomorrow -start amlodipine 2.5mg  today -losartan to be possibly added in outpatient   2. HTN: bp soft currently on metoprolol 12.5mg  BID   -continue metoprolol switching to succinate tomorrow -start amlodipine 2.5mg  today -losartan to be possibly added in outpatient   3. CAD: minimal seen on cath as above  -transitioned to crestor 20mg  daily from lipitor 40mg  -follow up lipid panel in 6 weeks outpatient   Signed, Vickki Muff MD PGY-2 Internal Medicine 04/27/2018, 1:25 PM     Patient seen and examined. Agree with assessment and plan.  Recurrent chest pain.  ECG today now shows evolution T wave abnormality now with deeply inverted anterolateral T wave inversion; ECG is significantly progressed since admission.  An echo, EF was 50 to 55%.  Left ventriculography was not done at cath.  Peak troponin 3.96.  She appears euvolemic.  No JVD.  Lungs clear without wheezing.  Rhythm is regular with 1/6 systolic murmur.  Abdomen is soft and nontender.  Pulses 2+.  There is no edema.  Plan to keep overnight.  We will obtain serial ECG in a.m.  Will initiate  low-dose amlodipine in the event of potential coronary vasospasm in the etiology of this abnormality and apical wall motion.  As blood pressure allows, would institute ARB therapy.  Tolerating initiation of rosuvastatin 20 mg.  Aim for target LDL less than 70.  Cr is stable at 1.03 plan for probable discharge tomorrow.  Troy Sine, MD, [facility] 04/27/2018 4:06 PM

## 2018-04-27 NOTE — Progress Notes (Addendum)
CARDIAC REHAB PHASE I   PRE:  Rate/Rhythm: 58 SB    BP: sitting 118/78    SaO2: 100 RA  MODE:  Ambulation: 430 ft   POST:  Rate/Rhythm: 84 SR with occ PAC    BP: sitting 153/92, retake 158/78     SaO2: 96 RA  Wrote order for MI. Pt feeling better. Able to walk with moderate pace, no CP or SOB. BP stable, actually high after walk. Urine is strong smelling but clear. To recliner and discussed MI, poss Takotsubo dx, restrictions, stress and life balance (pt pushes herself and was actually sick but active Thursday), ex gl, and CRPII. Good reception, asked many questions. Will refer to [place]. [phone]   [place], ACSM 04/27/2018 9:09 AM

## 2018-04-27 NOTE — Care Management Important Message (Signed)
Important Message  Patient Details  Name: Julie Petty MRN: [id number] Date of Birth: [birth year]   Medicare Important Message Given:  Yes    [name] [name] 04/27/2018, 3:26 PM

## 2018-04-28 LAB — CBC
HEMATOCRIT: 39.2 % (ref 36.0–46.0)
HEMOGLOBIN: 12.2 g/dL (ref 12.0–15.0)
MCH: 31.1 pg (ref 26.0–34.0)
MCHC: 31.1 g/dL (ref 30.0–36.0)
MCV: 100 fL (ref 80.0–100.0)
PLATELETS: 195 10*3/uL (ref 150–400)
RBC: 3.92 MIL/uL (ref 3.87–5.11)
RDW: 13.2 % (ref 11.5–15.5)
WBC: 6.1 10*3/uL (ref 4.0–10.5)
nRBC: 0 % (ref 0.0–0.2)

## 2018-04-28 MED ORDER — NITROGLYCERIN 0.4 MG SL SUBL: 0.4000 mg | SUBLINGUAL_TABLET | SUBLINGUAL | 1 refills | Status: DC | PRN

## 2018-04-28 MED ORDER — LOSARTAN POTASSIUM 25 MG PO TABS: 12.5000 mg | ORAL_TABLET | Freq: Every day | ORAL | 1 refills | Status: DC

## 2018-04-28 MED ORDER — AMLODIPINE BESYLATE 5 MG PO TABS: 2.5000 mg | ORAL_TABLET | Freq: Every day | ORAL | Status: DC

## 2018-04-28 MED ORDER — METOPROLOL SUCCINATE ER 25 MG PO TB24: 12.5000 mg | ORAL_TABLET | Freq: Every day | ORAL | 1 refills | Status: DC

## 2018-04-28 MED ORDER — LOSARTAN POTASSIUM 25 MG PO TABS
12.5000 mg | ORAL_TABLET | Freq: Every day | ORAL | Status: DC
  Administered 2018-04-28: 12.5 mg via ORAL
  Filled 2018-04-28: qty 1

## 2018-04-28 MED ORDER — ASPIRIN 81 MG PO TBEC: 81.0000 mg | DELAYED_RELEASE_TABLET | Freq: Every day | ORAL | Status: DC

## 2018-04-28 MED ORDER — ROSUVASTATIN CALCIUM 20 MG PO TABS: 20.0000 mg | ORAL_TABLET | Freq: Every day | ORAL | 1 refills | Status: DC

## 2018-04-28 NOTE — Discharge Summary (Signed)
Discharge Summary    Patient ID: Julie Petty,  MRN: [id number], DOB/AGE: [birth year] 84 y.o.  Admit date: 04/24/2018 Discharge date: 04/28/2018  Primary Care Provider: Aura Dials Primary Cardiologist: Dr. Sallyanne Kuster   Discharge Diagnoses    Principal Problem:   NSTEMI (non-ST elevated myocardial infarction) [facility]) Active Problems:   Essential hypertension   Hyperlipidemia with target LDL less than 70   Cardiomyopathy ([place])   Allergies Allergies  Allergen Reactions  . Codeine Nausea Only  . Sulfur Nausea Only    Diagnostic Studies/Procedures    Cath: 92/0/10   LV end diastolic pressure is normal.  Ost 2nd Diag lesion is 40% stenosed. Otherwise minimal coronary disease.   SUMMARY  Angiographically minimal coronary artery disease -no culprit lesion found.  Normal LV filling pressures.  RECOMMENDATIONS  Return to nursing unit for ongoing care.  TR band removal per protocol.  Medical management for mild CAD.  Consider the possibility of coronary spasm versus myocarditis/ pericarditis for elevated troponin.  Recommend Aspirin 3m daily for moderate CAD.    DGlenetta Hew MD  TTE: 04/25/18  Study Conclusions  - Left ventricle: The cavity size was normal. Wall thickness was   normal. Systolic function was normal. The estimated ejection   fraction was in the range of 50% to 55%. Distal anterior, apical   and inferoapical severe hypokinesis with a hyperdynamic base, may   suggest Takatsubo cardiomyopathy or LAD ischemia. No apical   thrombus by Definity contrast. Doppler parameters are consistent   with abnormal left ventricular relaxation (grade 1 diastolic   dysfunction). The E/e&' ratio is between 8-15, suggesting   indeterminate LV filling pressure. Ejection fraction (MOD,   2-plane): 50%. - Aortic valve: Trileaflet. Sclerosis without stenosis. There was   mild regurgitation. - Mitral valve: Mildly thickened leaflets . There was trivial  regurgitation. - Left atrium: Moderately dilated. - Right atrium: The atrium was mildly dilated. - Tricuspid valve: There was moderate regurgitation. - Pulmonary arteries: PA peak pressure: 37 mm Hg (S). - Systemic veins: The IVC measures <2.1 cm, but does not collapse   >50%, suggesting an elevated RA pressure of 8 mmHg.  Impressions:  - LVEF 50-55%, normal wall thickness, distal anterior, apical and   inferoapical severe hypokinesis with a hyperdynamic base,   suggestive of possible Takatsubo cardiomyopathy vs LAD ischemia,   grade 1 DD, indetermiante LV filling pressure, mild AI, trivial   MR, moderate LAE, mild RAE, moderate TR, RVSP 37 mmHg, IVC   suggests elevated RA pressure of 8 mmHg. _____________   History of Present Illness      84 y.o. active female with history of HTN & HLD (reported to be controlled on medication), GERD, glaucoma with no prior cardiac hx. She reported a remote cath 15 years ago that was ok. Within her family line of inlaws she has met our fellow Dr. BRhae Hammockbefore. 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 She had not had any recent cardiac symptoms.  The day of admission she was in her usual state of health. One of her grandchildren is getting married in a few months so she was shopping for a dress. [facility] she developed left sided substernal chest discomfort that did occasionally radiate to her left jaw. It was not associated with any SOB, n/v, diaphoresis, palpitations. She had never had this before. She felt the need to sit down  as she felt unwell. She got herself to her car and drank some water and drove home. She called a friend who'd had heart issues and collectively they decided she should call EMS. En route she received 378m ASA and 1 SL NTG which did not immediately relieve pain, but it had subsided since arriving in the ER. Initial troponin 0.25 with  subsequent value 1.12. Labs otherwise showed mild renal insufficiency adjusted for age with Cr 1.1, mild macrocytosis but normal Hgb. CXR shows cardiomegaly and mild bilateral interstitial thickening but no acute changes. VSS - BP low/normal. She had been started on heparin by the ER. EKG showed NSR with IRBBB, cannot exclude Q waves V1-V3, otherwise no acute ST-T changes. Given her symptoms she was admitted for further work up.   Hospital Course     Troponin cycled and peaked at 3.96. She was continued on IV heparin, along with ASA and statin. On admission her amlodipine was switched to BB given the concern for MI. She did have an episode of paroxysmal atrial tachycardia, and episodes of PVCs with improved with the addition of BB. Her atorvastatin was increased as well. Underwent cardiac cath noted above without significant CAD noted. Echo showed EF of 50-55% but did suggest apical ballooning. If was felt symptoms may have been related to vasospasm and started back on low dose amlodipine at 2.52mdaily. LDL noted at 117, therefore she was switched to Crestor 2055maily. Worked well with cardiac rehab without recurrent chest pain. Able to add low dose losartan at 12.5mg67mth blood pressures tolerating.   Julie Petty seen by Dr. KellClaiborne Billings determined stable for discharge home. Follow up in the office has been arranged. Medications are listed below.  _____________  Discharge Vitals Blood pressure 118/70, pulse (!) 59, temperature 97.6 F (36.4 C), temperature source Oral, resp. rate 19, height 5' 2.5" (1.588 m), weight 59.1 kg, SpO2 97 %.  Filed Weights   04/24/18 1441 04/24/18 2026 04/25/18 0855  Weight: 59.9 kg 59.1 kg 59.1 kg    Labs & Radiologic Studies    CBC Recent Labs    04/27/18 0256 04/28/18 0258  WBC 6.1 6.1  HGB 11.7* 12.2  HCT 37.8 39.2  MCV 99.7 100.0  PLT 201 195 993asic Metabolic Panel Recent Labs    04/26/18 0232 04/27/18 0256  NA 138 137  K 4.1 4.2  CL  107 108  CO2 28 24  GLUCOSE 100* 105*  BUN 18 18  CREATININE 1.04* 1.03*  CALCIUM 8.7* 8.7*   Liver Function Tests No results for input(s): AST, ALT, ALKPHOS, BILITOT, PROT, ALBUMIN in the last 72 hours. No results for input(s): LIPASE, AMYLASE in the last 72 hours. Cardiac Enzymes No results for input(s): CKTOTAL, CKMB, CKMBINDEX, TROPONINI in the last 72 hours. BNP Invalid input(s): POCBNP D-Dimer No results for input(s): DDIMER in the last 72 hours. Hemoglobin A1C No results for input(s): HGBA1C in the last 72 hours. Fasting Lipid Panel No results for input(s): CHOL, HDL, LDLCALC, TRIG, CHOLHDL, LDLDIRECT in the last 72 hours. Thyroid Function Tests No results for input(s): TSH, T4TOTAL, T3FREE, THYROIDAB in the last 72 hours.  Invalid input(s): FREET3 _____________  Dg Chest Port 1 View  Result Date: 04/24/2018 CLINICAL DATA:  Left-sided chest pain for 4 hours. No shortness of breath. EXAM: PORTABLE CHEST 1 VIEW COMPARISON:  12/16/2010 FINDINGS: There is bilateral mild interstitial thickening. There is no focal consolidation. There is no pleural effusion or pneumothorax. There is  stable cardiomegaly. There is thoracic aortic atherosclerosis. The osseous structures are unremarkable. IMPRESSION: 1. No acute cardiopulmonary disease. 2. Cardiomegaly. Electronically Signed   By: Kathreen Devoid   On: 04/24/2018 15:26   Disposition   Pt is being discharged home today in good condition.  Follow-up Plans & Appointments    Follow-up Information    Almyra Deforest, [place] Follow up on 05/07/2018.   Specialties:  Cardiology, Radiology Why:  at [facility] for your follow up appt.  Contact information: [address] [place] [address] [phone]          Discharge Instructions    Amb Referral to Cardiac Rehabilitation   Complete by:  As directed    Diagnosis:  NSTEMI   Call MD for:  redness, tenderness, or signs of infection (pain, swelling, redness, odor or green/yellow  discharge around incision site)   Complete by:  As directed    Diet - low sodium heart healthy   Complete by:  As directed    Discharge instructions   Complete by:  As directed    Radial Site Care Refer to this sheet in the next few weeks. These instructions provide you with information on caring for yourself after your procedure. Your caregiver may also give you more specific instructions. Your treatment has been planned according to current medical practices, but problems sometimes occur. Call your caregiver if you have any problems or questions after your procedure. HOME CARE INSTRUCTIONS You may shower the day after the procedure.Remove the bandage (dressing) and gently wash the site with plain soap and water.Gently pat the site dry.  Do not apply powder or lotion to the site.  Do not submerge the affected site in water for 3 to 5 days.  Inspect the site at least twice daily.  Do not flex or bend the affected arm for 24 hours.  No lifting over 5 pounds (2.3 kg) for 5 days after your procedure.  Do not drive home if you are discharged the same day of the procedure. Have someone else drive you.  You may drive 24 hours after the procedure unless otherwise instructed by your caregiver.  What to expect: Any bruising will usually fade within 1 to 2 weeks.  Blood that collects in the tissue (hematoma) may be painful to the touch. It should usually decrease in size and tenderness within 1 to 2 weeks.  SEEK IMMEDIATE MEDICAL CARE IF: You have unusual pain at the radial site.  You have redness, warmth, swelling, or pain at the radial site.  You have drainage (other than a small amount of blood on the dressing).  You have chills.  You have a fever or persistent symptoms for more than 72 hours.  You have a fever and your symptoms suddenly get worse.  Your arm becomes pale, cool, tingly, or numb.  You have heavy bleeding from the site. Hold pressure on the site.   Increase activity slowly    Complete by:  As directed       Discharge Medications     Medication List    STOP taking these medications   atorvastatin 20 MG tablet Commonly known as:  LIPITOR     TAKE these medications   amLODipine 5 MG tablet Commonly known as:  NORVASC Take 0.5 tablets (2.5 mg total) by mouth daily. What changed:  how much to take   aspirin 81 MG EC tablet Take 1 tablet (81 mg total) by mouth daily. Start taking on:  04/29/2018  brinzolamide 1 % ophthalmic suspension Commonly known as:  AZOPT Place 1 drop into the right eye 2 (two) times daily.   calcium carbonate 600 MG Tabs tablet Commonly known as:  OS-CAL Take 600 mg by mouth every other day.   losartan 25 MG tablet Commonly known as:  COZAAR Take 0.5 tablets (12.5 mg total) by mouth daily.   metoprolol succinate 25 MG 24 hr tablet Commonly known as:  TOPROL-XL Take 0.5 tablets (12.5 mg total) by mouth daily. Start taking on:  04/29/2018   nitroGLYCERIN 0.4 MG SL tablet Commonly known as:  NITROSTAT Place 1 tablet (0.4 mg total) under the tongue every 5 (five) minutes x 3 doses as needed for chest pain.   rosuvastatin 20 MG tablet Commonly known as:  CRESTOR Take 1 tablet (20 mg total) by mouth daily at 6 PM.   triamcinolone 0.025 % ointment Commonly known as:  KENALOG Apply 1 application topically daily.       Acute coronary syndrome (MI, NSTEMI, STEMI, etc) this admission?: No.     Outstanding Labs/Studies   FLP/LFTs in 6 weeks if tolerating statin. BMET at follow up.   Duration of Discharge Encounter   Greater than 30 minutes including physician time.  Signed, Reino Bellis NP-C 04/28/2018, 11:34 AM

## 2018-04-28 NOTE — Progress Notes (Signed)
CARDIAC REHAB PHASE I   PRE:  Rate/Rhythm: 68 SR    BP: sitting 128/88    SaO2:   MODE:  Ambulation: 500 ft   POST:  Rate/Rhythm: 80 SR    BP: sitting 114/60     SaO2:   Tolerated well, no c/o. BP lower after walk but denied sx. Eager to d/c.  [id number]  [place], ACSM 04/28/2018 10:08 AM

## 2018-04-28 NOTE — Progress Notes (Addendum)
Progress Note  Patient Name: Julie Petty Date of Encounter: 04/28/2018  Primary Cardiologist: Croitoru  Subjective   Pt feeling well today, still no cp or dyspnea no PND, walking the halls without difficulty.    Inpatient Medications    Scheduled Meds: . amLODipine  2.5 mg Oral Daily  . aspirin EC  81 mg Oral Daily  . brinzolamide  1 drop Right Eye BID  . calcium carbonate  625 mg Oral QODAY  . metoprolol succinate  12.5 mg Oral Daily  . metoprolol tartrate  12.5 mg Oral BID  . rosuvastatin  20 mg Oral q1800  . sodium chloride flush  3 mL Intravenous Q12H  . sodium chloride flush  3 mL Intravenous Q12H   Continuous Infusions: . sodium chloride    . sodium chloride     PRN Meds: sodium chloride, sodium chloride, acetaminophen, nitroGLYCERIN, ondansetron (ZOFRAN) IV, sodium chloride flush, sodium chloride flush   Vital Signs    Vitals:   04/27/18 2300 04/28/18 0008 04/28/18 0300 04/28/18 0800  BP:    118/70  Pulse:    (!) 59  Resp: 11  12 19   Temp:  97.6 F (36.4 C)  97.6 F (36.4 C)  TempSrc:  Oral  Oral  SpO2:    97%  Weight:      Height:        Intake/Output Summary (Last 24 hours) at 04/28/2018 0854 Last data filed at 04/27/2018 2113 Gross per 24 hour  Intake 246 ml  Output -  Net 246 ml    I/O since admission:   Filed Weights   04/24/18 1441 04/24/18 2026 04/25/18 0855  Weight: 59.9 kg 59.1 kg 59.1 kg    Telemetry    Occasional sinus brady, sinus arrythmia - Personally Reviewed  ECG    ECG (independently read by me): 11/4 sinus arrythmia, some deepening T wave inversions in lateral leads compared with ecg of 11/3.  ECG for today shows improvement in T wave inversions less inverted than yesterday  Physical Exam    BP 118/70   Pulse (!) 59   Temp 97.6 F (36.4 C) (Oral)   Resp 19   Ht 5' 2.5" (1.588 m)   Wt 59.1 kg   SpO2 97%   BMI 23.45 kg/m   Cardiac: no JVD, normal rate and rhythm, clear s1 and s2, 1/6 systolic  murmur Pulmonary: CTAB, not in distress,  Abdominal: non distended abdomen, soft and nontender Extremities: no LE edema Psych: Alert, conversant, in good spirits  Labs    Chemistry Recent Labs  Lab 04/24/18 2058 04/25/18 0009 04/26/18 0232 04/27/18 0256  NA  --  135 138 137  K  --  3.7 4.1 4.2  CL  --  104 107 108  CO2  --  25 28 24   GLUCOSE  --  97 100* 105*  BUN  --  18 18 18   CREATININE  --  0.90 1.04* 1.03*  CALCIUM  --  8.8* 8.7* 8.7*  PROT 6.8  --   --   --   ALBUMIN 3.7  --   --   --   AST 32  --   --   --   ALT 16  --   --   --   ALKPHOS 45  --   --   --   BILITOT 0.9  --   --   --   GFRNONAA  --  57* 48* 49*  GFRAA  --  >  60 56* 56*  ANIONGAP  --  6 3* 5     Hematology Recent Labs  Lab 04/26/18 0232 04/27/18 0256 04/28/18 0258  WBC 5.4 6.1 6.1  RBC 4.01 3.79* 3.92  HGB 12.2 11.7* 12.2  HCT 40.4 37.8 39.2  MCV 100.7* 99.7 100.0  MCH 30.4 30.9 31.1  MCHC 30.2 31.0 31.1  RDW 13.2 13.2 13.2  PLT 218 201 195    Cardiac Enzymes Recent Labs  Lab 04/24/18 1520 04/24/18 2058 04/25/18 0009  TROPONINI 1.12* 3.34* 3.96*    Recent Labs  Lab 04/24/18 1337  TROPIPOC 0.25*     BNPNo results for input(s): BNP, PROBNP in the last 168 hours.   DDimer No results for input(s): DDIMER in the last 168 hours.   Lipid Panel     Component Value Date/Time   CHOL 174 04/25/2018 0009   TRIG 32 04/25/2018 0009   HDL 51 04/25/2018 0009   CHOLHDL 3.4 04/25/2018 0009   VLDL 6 04/25/2018 0009   LDLCALC 117 (H) 04/25/2018 0009     Radiology    No results found.  Cardiac Studies   LV EF: 50% - 55%  ------------------------------------------------------------------- Indications: (Elevated troponin).  ------------------------------------------------------------------- History: PMH: NSTEMI Risk factors: Hypertension. Dyslipidemia.  ------------------------------------------------------------------- Study Conclusions  - Left ventricle:  The cavity size was normal. Wall thickness was normal. Systolic function was normal. The estimated ejection fraction was in the range of 50% to 55%. Distal anterior, apical and inferoapical severe hypokinesis with a hyperdynamic base, may suggest Takatsubo cardiomyopathy or LAD ischemia. No apical thrombus by Definity contrast. Doppler parameters are consistent with abnormal left ventricular relaxation (grade 1 diastolic dysfunction). The E/e&' ratio is between 8-15, suggesting indeterminate LV filling pressure. Ejection fraction (MOD, 2-plane): 50%. - Aortic valve: Trileaflet. Sclerosis without stenosis. There was mild regurgitation. - Mitral valve: Mildly thickened leaflets . There was trivial regurgitation. - Left atrium: Moderately dilated. - Right atrium: The atrium was mildly dilated. - Tricuspid valve: There was moderate regurgitation. - Pulmonary arteries: PA peak pressure: 37 mm Hg (S). - Systemic veins: The IVC measures <2.1 cm, but does not collapse >50%, suggesting an elevated RA pressure of 8 mmHg.  Impressions:  - LVEF 50-55%, normal wall thickness, distal anterior, apical and inferoapical severe hypokinesis with a hyperdynamic base, suggestive of possible Takatsubo cardiomyopathy vs LAD ischemia, grade 1 DD, indetermiante LV filling pressure, mild AI, trivial MR, moderate LAE, mild RAE, moderate TR, RVSP 37 mmHg, IVC suggests elevated RA pressure of 8 mmHg.  Diagnostic Diagram         Patient Profile     84 y.o. female 84 y.o. female  with PMH of HTN & HLD, GERD, glaucoma with no prior cardiac hx. While shopping she developed left sided chest pain radiating to her jaw.  Went home later decided to call EMS who gave her asa 324 SL NTG, pain subsided in ER.  Last troponin 3.96 admitted for NSTEMI started on heparin.   Minimal CAD noted on cath 11/4. ECHO shows apical ballooning. Presentation consistent with takotsubo  cardiomyopathy.     Assessment & Plan    1. Takotsubo Cardiomyopathy VS Coronary Vasospasm: volume status stable on exam  ECHO reviewed personally, apical ballooning present.  Cath results show minimal CAD, no LV gram performed.  ECG T waves evolving deeply inverted yesterday but improvement today.     -metoprolol succinate 12.5mg  today -continue amlodipine 2.5mg   -losartan 12.5mg  added today   2. HTN: currently on metoprolol succinate 12.5mg   daily, amlodipine 2.5mg    -continue metoprolol, amlodipine -losartan 12.5mg  added today   3. CAD: minimal seen on cath as above  -transitioned to crestor 20mg  daily from lipitor 40mg  -follow up lipid panel in 6 weeks outpatient  Doing well, stable for discharge home today will follow up with Dr. Sallyanne Kuster  Signed, Vickki Muff MD PGY-2 Internal Medicine 04/28/2018, 8:54 AM     Patient seen and examined. Agree with assessment and plan. Feels well. Walking briskly in hall with rehab. No chest pain or dyspnea.  ECG today shows mild improvement in the previous deeply inverted T wave inversion.  BP stable.  HR  In the mid 60's on low dose metoprolol, transitioned to succinate. Tolerating low doase amlodipine for potential spasm and will initiate very low dose losartan at 12.5 mg.  Hoprfully will have complete recovery of apical ballooning.  OK for dc today. Arrange for ov in 1 - 2 weeks (Pt is planning to go to [facility] on 12/3).   Troy Sine, MD, [facility] 04/28/2018 10:59 AM

## 2018-04-29 ENCOUNTER — Telehealth (HOSPITAL_COMMUNITY): Payer: Self-pay

## 2018-04-29 NOTE — Telephone Encounter (Signed)
Attempted to contact patient to see if she is interested in the Cardiac Rehab Program. [facility]

## 2018-04-29 NOTE — Telephone Encounter (Signed)
Pt insurance is active and benefits verified through [facility]. Co-pay $20.00, DED $0.00/$0.00 met, out of pocket $4,400.00/$65.00 met, co-insurance 0%. No pre-authorization. Passport, 04/29/18 @ 3:42PM, REF# [id number]  Will contact patient to see if she is interested in the Cardiac Rehab Program. If interested, patient will need to complete follow up appt. Once completed, patient will be contacted for scheduling upon review by the RN Navigator.

## 2018-05-05 ENCOUNTER — Ambulatory Visit: Payer: Self-pay

## 2018-05-06 ENCOUNTER — Other Ambulatory Visit: Payer: Self-pay | Admitting: Internal Medicine

## 2018-05-06 NOTE — Progress Notes (Signed)
Encounter opened in error

## 2018-05-07 ENCOUNTER — Ambulatory Visit: Payer: Medicare Other | Admitting: Physician Assistant

## 2018-05-07 ENCOUNTER — Encounter: Payer: Self-pay | Admitting: Physician Assistant

## 2018-05-07 VITALS — BP 142/85 | HR 62 | Resp 16 | Ht 62.5 in | Wt 133.0 lb

## 2018-05-07 DIAGNOSIS — E785 Hyperlipidemia, unspecified: Secondary | ICD-10-CM

## 2018-05-07 DIAGNOSIS — R7989 Other specified abnormal findings of blood chemistry: Secondary | ICD-10-CM

## 2018-05-07 DIAGNOSIS — I2510 Atherosclerotic heart disease of native coronary artery without angina pectoris: Secondary | ICD-10-CM

## 2018-05-07 DIAGNOSIS — I10 Essential (primary) hypertension: Secondary | ICD-10-CM | POA: Diagnosis not present

## 2018-05-07 DIAGNOSIS — G44209 Tension-type headache, unspecified, not intractable: Secondary | ICD-10-CM

## 2018-05-07 DIAGNOSIS — R778 Other specified abnormalities of plasma proteins: Secondary | ICD-10-CM

## 2018-05-07 MED ORDER — AMLODIPINE BESYLATE 5 MG PO TABS: 2.5000 mg | ORAL_TABLET | Freq: Every day | ORAL | Status: DC

## 2018-05-07 NOTE — Patient Instructions (Signed)
Medication Instructions:  TAKE  Norvasc daily at bedtime  STOP Losartan for 1 week if Headache and Nausea stops then do not restart medication but if symptoms continue Restart Losartan Then STOP Metoprolol for 1 week if headache and nausea gets better stop medication but if symptoms do not get better contact the office  If you need a refill on your cardiac medications before your next appointment, please call your pharmacy.   Lab work: Your physician recommends that you return for lab work in: 6-8weeks Lipid and LFT If you have labs (blood work) drawn today and your tests are completely normal, you will receive your results only by: Marland Kitchen MyChart Message (if you have MyChart) OR . A paper copy in the mail If you have any lab test that is abnormal or we need to change your treatment, we will call you to review the results.  Testing/Procedures: None   Follow-Up: At [facility], you and your health needs are our priority.  As part of our continuing mission to provide you with exceptional heart care, we have created designated Provider Care Teams.  These Care Teams include your primary Cardiologist (physician) and Advanced Practice Providers (APPs -  Physician Assistants and Nurse Practitioners) who all work together to provide you with the care you need, when you need it. . Your physician recommends that you schedule a follow-up appointment in: 2-3 months with Dr Sallyanne Kuster  Any Other Special Instructions Will Be Listed Below (If Applicable).

## 2018-05-07 NOTE — Progress Notes (Signed)
Cardiology Office Note    Date:  05/09/2018   ID:  Bristal, Steffy September 19, 1931, MRN [id number]  PCP:  Julie Petty  Cardiologist:  Dr. Sallyanne Petty   Chief Complaint  Patient presents with  . Hospitalization Follow-up    seen for Dr. Sallyanne Petty.     History of Present Illness:  Julie Petty is a 84 y.o. female with PMH of HTN, HLD, and GERD who recently presented with chest pain.  Prior to the hospitalization, she has always been very active and participates in the [employer] sne[employer].  While trying on outfit at [facility], she developed left substernal chest discomfort radiating to her left jaw.  Initial point-of-care troponin was 0.25.  Troponin peaked at 3.96.  Echocardiogram obtained on 04/25/2018 showed EF 50 to 55%, grade 1 DD, mild aortic regurgitation and trivial mitral regurgitation, moderate LAE, moderate TR.  There was some wall motion abnormality on echocardiogram suggested possible Takotsubo cardiomyopathy.  Cardiac catheterization performed on 04/26/2018 showed 40% ostial D2 lesion, otherwise no coronary artery disease was identified.  Normal LVEDP.  Patient was placed on 81 mg aspirin for maintenance therapy.  Differential diagnosis other than Takotsubo cardiomyopathy include transient coronary vasospasm or possible myocarditis.  Her LDL was elevated to this hospitalization despite being on 40 mg daily of Lipitor.  She was transitioned to 20 mg Crestor.  Patient presents today for post hospital follow-up.  She denies any recent chest pain or shortness of breath.  She does have some headache and nausea for about 3 hours after she take her BP medications.  She did take all of her blood pressure medication or it was.  I recommended for her to switch to amlodipine to nighttime.  Both losartan and metoprolol are new for her, I am not sure which medication is causing her to have the headache and nausea.  I recommended her to hold losartan for a week to see if this may be the  medication that is causing the issue.  If her symptoms persist, then she will need to restart losartan and to try to hold metoprolol for a week as a trial.  She will monitor her blood pressure.  She seems to be doing well on the new Crestor.  She will need a fasting lipid panel in 6 to 8 weeks.  She can follow-up with Dr. Sallyanne Petty in 2 to 61-month.   Past Medical History:  Diagnosis Date  . Essential hypertension   . GERD (gastroesophageal reflux disease)   . Glaucoma   . Hernia   . Hyperlipemia     Past Surgical History:  Procedure Laterality Date  . CARDIAC CATHETERIZATION  04/05/03  . KNEE ARTHROSCOPY  09/17/05   left  . LEFT HEART CATH AND CORONARY ANGIOGRAPHY N/A 04/26/2018   Procedure: LEFT HEART CATH AND CORONARY ANGIOGRAPHY;  Surgeon: Leonie Man, MD;  Location: [place] [facility];  Service: Cardiovascular;  Laterality: N/A;  . VAGINAL HYSTERECTOMY  1980's    Current Medications: Outpatient Medications Prior to Visit  Medication Sig Dispense Refill  . aspirin EC 81 MG EC tablet Take 1 tablet (81 mg total) by mouth daily.    . brinzolamide (AZOPT) 1 % ophthalmic suspension Place 1 drop into the right eye 2 (two) times daily.     . calcium carbonate (OS-CAL) 600 MG TABS Take 600 mg by mouth every other day.     . losartan (COZAAR) 25 MG tablet Take 0.5 tablets (12.5 mg total) by mouth  daily. 30 tablet 1  . metoprolol succinate (TOPROL-XL) 25 MG 24 hr tablet Take 0.5 tablets (12.5 mg total) by mouth daily. 30 tablet 1  . nitroGLYCERIN (NITROSTAT) 0.4 MG SL tablet Place 1 tablet (0.4 mg total) under the tongue every 5 (five) minutes x 3 doses as needed for chest pain. 25 tablet 1  . rosuvastatin (CRESTOR) 20 MG tablet Take 1 tablet (20 mg total) by mouth daily at 6 PM. 30 tablet 1  . triamcinolone (KENALOG) 0.025 % ointment Apply 1 application topically daily.  0  . amLODipine (NORVASC) 5 MG tablet Take 0.5 tablets (2.5 mg total) by mouth daily.     No facility-administered  medications prior to visit.      Allergies:   Codeine and Sulfur   Social History   Socioeconomic History  . Marital status: Divorced    Spouse name: Not on file  . Number of children: Not on file  . Years of education: Not on file  . Highest education level: Not on file  Occupational History  . Not on file  Social Needs  . Financial resource strain: Not on file  . Food insecurity:    Worry: Not on file    Inability: Not on file  . Transportation needs:    Medical: Not on file    Non-medical: Not on file  Tobacco Use  . Smoking status: Never Smoker  . Smokeless tobacco: Never Used  Substance and Sexual Activity  . Alcohol use: No  . Drug use: Not on file  . Sexual activity: Not on file  Lifestyle  . Physical activity:    Days per week: Not on file    Minutes per session: Not on file  . Stress: Not on file  Relationships  . Social connections:    Talks on phone: Not on file    Gets together: Not on file    Attends religious service: Not on file    Active member of club or organization: Not on file    Attends meetings of clubs or organizations: Not on file    Relationship status: Not on file  Other Topics Concern  . Not on file  Social History Narrative  . Not on file     Family History:  The patient's family history includes Heart attack in her brother.   ROS:   Please see the history of present illness.    ROS All other systems reviewed and are negative.   PHYSICAL EXAM:   VS:  BP (!) 142/85   Pulse 62   Resp 16   Ht 5' 2.5" (1.588 m)   Wt 133 lb (60.3 kg)   SpO2 98%   BMI 23.94 kg/m    GEN: Well nourished, well developed, in no acute distress  HEENT: normal  Neck: no JVD, carotid bruits, or masses Cardiac: RRR; no murmurs, rubs, or gallops,no edema  Respiratory:  clear to auscultation bilaterally, normal work of breathing GI: soft, nontender, nondistended, + BS MS: no deformity or atrophy  Skin: warm and dry, no rash Neuro:  Alert and Oriented  x 3, Strength and sensation are intact Psych: euthymic mood, full affect  Wt Readings from Last 3 Encounters:  05/07/18 133 lb (60.3 kg)  04/25/18 130 lb 4.7 oz (59.1 kg)      Studies/Labs Reviewed:   EKG:  EKG is not ordered today.    Recent Labs: 04/24/2018: ALT 16; TSH 1.259 04/27/2018: BUN 18; Creatinine, Ser 1.03; Potassium 4.2; Sodium 137  04/28/2018: Hemoglobin 12.2; Platelets 195   Lipid Panel    Component Value Date/Time   CHOL 174 04/25/2018 0009   TRIG 32 04/25/2018 0009   HDL 51 04/25/2018 0009   CHOLHDL 3.4 04/25/2018 0009   VLDL 6 04/25/2018 0009   LDLCALC 117 (H) 04/25/2018 0009    Additional studies/ records that were reviewed today include:   Echo 04/25/2018 LV EF: 50% -   55% Study Conclusions  - Left ventricle: The cavity size was normal. Wall thickness was   normal. Systolic function was normal. The estimated ejection   fraction was in the range of 50% to 55%. Distal anterior, apical   and inferoapical severe hypokinesis with a hyperdynamic base, may   suggest Takatsubo cardiomyopathy or LAD ischemia. No apical   thrombus by Definity contrast. Doppler parameters are consistent   with abnormal left ventricular relaxation (grade 1 diastolic   dysfunction). The E/e&' ratio is between 8-15, suggesting   indeterminate LV filling pressure. Ejection fraction (MOD,   2-plane): 50%. - Aortic valve: Trileaflet. Sclerosis without stenosis. There was   mild regurgitation. - Mitral valve: Mildly thickened leaflets . There was trivial   regurgitation. - Left atrium: Moderately dilated. - Right atrium: The atrium was mildly dilated. - Tricuspid valve: There was moderate regurgitation. - Pulmonary arteries: PA peak pressure: 37 mm Hg (S). - Systemic veins: The IVC measures <2.1 cm, but does not collapse   >50%, suggesting an elevated RA pressure of 8 mmHg.  Impressions:  - LVEF 50-55%, normal wall thickness, distal anterior, apical and   inferoapical  severe hypokinesis with a hyperdynamic base,   suggestive of possible Takatsubo cardiomyopathy vs LAD ischemia,   grade 1 DD, indetermiante LV filling pressure, mild AI, trivial   MR, moderate LAE, mild RAE, moderate TR, RVSP 37 mmHg, IVC   suggests elevated RA pressure of 8 mmHg.    Cath 16/06/958  LV end diastolic pressure is normal.  Ost 2nd Diag lesion is 40% stenosed. Otherwise minimal coronary disease.   SUMMARY  Angiographically minimal coronary artery disease -no culprit lesion found.  Normal LV filling pressures.  RECOMMENDATIONS  Return to nursing unit for ongoing care.  TR band removal per protocol.  Medical management for mild CAD.  Consider the possibility of coronary spasm versus myocarditis/ pericarditis for elevated troponin.  Recommend Aspirin 81mg  daily for moderate CAD.   Discharge planning per family service.3    ASSESSMENT:    1. Elevated troponin   2. Hyperlipidemia with target LDL less than 70   3. Essential hypertension   4. Coronary artery disease involving native coronary artery of native heart without angina pectoris   5. Tension-type headache, not intractable, unspecified chronicity pattern      PLAN:  In order of problems listed above:  1. Elevated troponin: Recently underwent cardiac catheterization that showed only mild ostial diagonal lesion, otherwise clean coronary.  Differential diagnosis includes Takotsubo versus coronary spasm versus myocarditis.  She has been on amlodipine given prior to the recent hospitalization.  2. Headache: This is something new for her since the hospitalization.  Her losartan and metoprolol are new.  I am not sure which one the 2 medications causing her symptoms.  I advised her to do a trial to come off each medication for 1 week to see if her symptoms would resolve.  3. Hypertension: Blood pressure stable on current medication  4. Hyperlipidemia: Recently switched to Crestor.  Will need fasting lipid  panel and LFT in 6  to 8 weeks  5. CAD: Continue aspirin, minimal coronary artery disease on the recent cardiac catheterization.  40% ostial D2 lesion only, otherwise clean coronaries.    Medication Adjustments/Labs and Tests Ordered: Current medicines are reviewed at length with the patient today.  Concerns regarding medicines are outlined above.  Medication changes, Labs and Tests ordered today are listed in the Patient Instructions below. Patient Instructions  Medication Instructions:  TAKE  Norvasc daily at bedtime  STOP Losartan for 1 week if Headache and Nausea stops then do not restart medication but if symptoms continue Restart Losartan Then STOP Metoprolol for 1 week if headache and nausea gets better stop medication but if symptoms do not get better contact the office  If you need a refill on your cardiac medications before your next appointment, please call your pharmacy.   Lab work: Your physician recommends that you return for lab work in: 6-8weeks Lipid and LFT If you have labs (blood work) drawn today and your tests are completely normal, you will receive your results only by: Marland Kitchen MyChart Message (if you have MyChart) OR . A paper copy in the mail If you have any lab test that is abnormal or we need to change your treatment, we will call you to review the results.  Testing/Procedures: None   Follow-Up: At [facility], you and your health needs are our priority.  As part of our continuing mission to provide you with exceptional heart care, we have created designated Provider Care Teams.  These Care Teams include your primary Cardiologist (physician) and Advanced Practice Providers (APPs -  Physician Assistants and Nurse Practitioners) who all work together to provide you with the care you need, when you need it. . Your physician recommends that you schedule a follow-up appointment in: 2-3 months with Dr Julie Petty  Any Other Special Instructions Will Be Listed Below (If  Applicable).       Hilbert Corrigan, [place]  05/09/2018 11:04 PM    [address], [place], [place]  [address] Phone: [phone]; Fax: [phone]

## 2018-05-09 ENCOUNTER — Encounter: Payer: Self-pay | Admitting: Physician Assistant

## 2018-05-10 ENCOUNTER — Telehealth: Payer: Self-pay | Admitting: Cardiovascular Disease

## 2018-05-10 NOTE — Telephone Encounter (Signed)
New message:  Patient calling concerning a medication that was changed  Friday when she was in the office. Patient states that she has not been feeling well. Please call patient.

## 2018-05-10 NOTE — Telephone Encounter (Signed)
Spoke with pt. Pt sts that when she was seen by Almyra Deforest, PA on 04/1518 she was given medication instructions that she would like to review. TAKE  Norvasc daily at bedtime  STOP Losartan for 1 week if Headache and Nausea stops then do not restart medication but if symptoms continue Restart Losartan Then STOP Metoprolol for 1 week if headache and nausea gets better stop medication but if symptoms do not get better contact the office  Pt sts that she did not take Losartan over the weekend and she continued to have a headache and nausea she denies vomitting. Pt sts that she feels better today but she still has a slight headache.  She has been monitoring her BP: 11/15 130/70 57bpm, 11/17 139/78.  Pt wants to know if she should resume Losartan and stop Metoprolol. Adv pt to continue to follow Hao's recommendation. She feels better today, adv her to continue to HOLD Losartan. If symptoms return/ worsen she should f/u the instructions given above. Adv pt to continue to monitor her BP regularly, adv pt that she can take Tylenol prn for headache. Adv pt to contact the office is symptoms develop or worsen. Pt agreeable with plan and voiced appreciation for the assistance.

## 2018-05-12 NOTE — Telephone Encounter (Signed)
Called patient to see if she was interested in participating in the Cardiac Rehab Program. Patient stated not at time, she is currently enrolled in a program at the [facility].  Closed referral

## 2018-05-16 NOTE — Telephone Encounter (Signed)
Agreed, I wasn't sure if it was the metoprolol or losartan that was causing her issue. If she felt better after holding the losartan, I would recommend to continue holding it

## 2018-05-18 ENCOUNTER — Other Ambulatory Visit: Payer: Self-pay | Admitting: *Deleted

## 2018-05-18 MED ORDER — METOPROLOL SUCCINATE ER 25 MG PO TB24: 12.5000 mg | ORAL_TABLET | Freq: Every day | ORAL | 3 refills | Status: DC

## 2018-05-19 ENCOUNTER — Other Ambulatory Visit: Payer: Self-pay | Admitting: *Deleted

## 2018-05-19 MED ORDER — ROSUVASTATIN CALCIUM 20 MG PO TABS: 20.0000 mg | ORAL_TABLET | Freq: Every day | ORAL | 11 refills | Status: DC

## 2018-07-05 ENCOUNTER — Other Ambulatory Visit (HOSPITAL_COMMUNITY): Payer: Self-pay | Admitting: Surgery

## 2018-07-05 DIAGNOSIS — R110 Nausea: Secondary | ICD-10-CM

## 2018-07-07 ENCOUNTER — Other Ambulatory Visit: Payer: Self-pay | Admitting: Surgery

## 2018-07-07 DIAGNOSIS — R519 Headache, unspecified: Secondary | ICD-10-CM

## 2018-07-07 DIAGNOSIS — Z87898 Personal history of other specified conditions: Secondary | ICD-10-CM

## 2018-07-07 DIAGNOSIS — R51 Headache: Principal | ICD-10-CM

## 2018-07-07 DIAGNOSIS — R109 Unspecified abdominal pain: Secondary | ICD-10-CM

## 2018-07-12 ENCOUNTER — Encounter: Payer: Self-pay | Admitting: Radiology

## 2018-07-12 ENCOUNTER — Ambulatory Visit
Admission: RE | Admit: 2018-07-12 | Discharge: 2018-07-12 | Disposition: A | Discharge status: Home / Self Care | Payer: Medicare Other | Source: Ambulatory Visit | Attending: Surgery | Admitting: Surgery

## 2018-07-12 DIAGNOSIS — R519 Headache, unspecified: Secondary | ICD-10-CM

## 2018-07-12 DIAGNOSIS — R51 Headache: Principal | ICD-10-CM

## 2018-07-12 DIAGNOSIS — Z87898 Personal history of other specified conditions: Secondary | ICD-10-CM

## 2018-07-12 DIAGNOSIS — R109 Unspecified abdominal pain: Secondary | ICD-10-CM

## 2018-07-12 MED ORDER — IOPAMIDOL (ISOVUE-300) INJECTION 61%
100.0000 mL | Freq: Once | INTRAVENOUS | Status: AC | PRN
  Administered 2018-07-12: 100 mL via INTRAVENOUS

## 2018-07-16 ENCOUNTER — Encounter (HOSPITAL_COMMUNITY): Payer: Self-pay

## 2018-07-16 ENCOUNTER — Other Ambulatory Visit: Payer: Self-pay | Admitting: Surgery

## 2018-07-16 ENCOUNTER — Ambulatory Visit (HOSPITAL_COMMUNITY): Payer: Medicare Other

## 2018-07-16 DIAGNOSIS — M898X8 Other specified disorders of bone, other site: Secondary | ICD-10-CM

## 2018-07-27 ENCOUNTER — Ambulatory Visit
Admission: RE | Admit: 2018-07-27 | Discharge: 2018-07-27 | Disposition: A | Discharge status: Home / Self Care | Payer: Medicare Other | Source: Ambulatory Visit | Attending: Surgery | Admitting: Surgery

## 2018-07-27 DIAGNOSIS — M898X8 Other specified disorders of bone, other site: Secondary | ICD-10-CM

## 2018-07-27 MED ORDER — GADOBENATE DIMEGLUMINE 529 MG/ML IV SOLN
12.0000 mL | Freq: Once | INTRAVENOUS | Status: AC | PRN
  Administered 2018-07-27: 12 mL via INTRAVENOUS

## 2018-07-28 ENCOUNTER — Ambulatory Visit (HOSPITAL_COMMUNITY)
Admission: RE | Admit: 2018-07-28 | Discharge: 2018-07-28 | Disposition: A | Discharge status: Home / Self Care | Payer: Medicare Other | Source: Ambulatory Visit | Attending: Surgery | Admitting: Surgery

## 2018-07-28 ENCOUNTER — Encounter (HOSPITAL_COMMUNITY): Payer: Medicare Other

## 2018-07-28 DIAGNOSIS — R110 Nausea: Secondary | ICD-10-CM | POA: Diagnosis present

## 2018-07-28 MED ORDER — MORPHINE SULFATE (PF) 2 MG/ML IV SOLN
2.0000 mg | Freq: Once | INTRAVENOUS | Status: AC
  Administered 2018-07-28: 2 mg via INTRAVENOUS

## 2018-07-28 MED ORDER — TECHNETIUM TC 99M MEBROFENIN IV KIT
5.0000 | PACK | Freq: Once | INTRAVENOUS | Status: AC | PRN
  Administered 2018-07-28: 5 via INTRAVENOUS

## 2018-07-28 MED ORDER — ONDANSETRON HCL 4 MG/2ML IJ SOLN: 4.0000 mg | Freq: Once | INTRAMUSCULAR | Status: DC

## 2018-07-28 MED ORDER — ONDANSETRON HCL 4 MG/2ML IJ SOLN
INTRAMUSCULAR | Status: AC
  Administered 2018-07-28: 4 mg
  Filled 2018-07-28: qty 2

## 2018-07-28 MED ORDER — MORPHINE SULFATE (PF) 2 MG/ML IV SOLN
INTRAVENOUS | Status: AC
  Filled 2018-07-28: qty 1

## 2018-07-30 ENCOUNTER — Ambulatory Visit: Payer: Self-pay | Admitting: Surgery

## 2018-07-30 LAB — LIPID PANEL
CHOL/HDL RATIO: 2.9 ratio (ref 0.0–4.4)
CHOLESTEROL TOTAL: 147 mg/dL (ref 100–199)
HDL: 51 mg/dL (ref >39)
LDL Calculated: 79 mg/dL (ref 0–99)
TRIGLYCERIDES: 85 mg/dL (ref 0–149)
VLDL CHOLESTEROL CAL: 17 mg/dL (ref 5–40)

## 2018-07-30 LAB — HEPATIC FUNCTION PANEL
ALT: 25 IU/L (ref 0–32)
AST: 27 IU/L (ref 0–40)
Albumin: 4.3 g/dL (ref 3.6–4.6)
Alkaline Phosphatase: 55 IU/L (ref 39–117)
Bilirubin Total: 0.4 mg/dL (ref 0.0–1.2)
Bilirubin, Direct: 0.13 mg/dL (ref 0.00–0.40)
Total Protein: 6.9 g/dL (ref 6.0–8.5)

## 2018-07-30 NOTE — H&P (View-Only) (Signed)
Julie Petty Documented: 07/05/2018 11:10 AM Location: [facility] Patient #: [id number] DOB: 05-13-1932 Single / Language: Cleophus Molt / Race: White Female   History of Present Illness Marcello Moores A. Mikhael Hendriks MD; 07/05/2018 12:14 PM) Patient words: Patient sent at the request of Mattie Marlin PA for nausea and failure to thrive. The patient states she was in good health up until 3-4 weeks ago when she began to complain of headache, constipation, nausea, and low back pain. She is very active with her church and the [facility]. She has had no energy and complains now of knee pain and overall feeling of fatigue and malaise. She has no cough, chest pain, shortness of breath or nasal congestion. She describes low back pain just above her hips and knee pain. An ultrasound was obtained which shows a thickened gallbladder with either stones or sludge and this gallbladder is dilated. There is no signs of any gallbladder wall thickening or acute process. Since she had a UTI recently. She has no other workup that I can find a few application care everywhere. She denies any itching or yellowing of the skin. She denies any abdominal pain and her diet does not seem to make her symptoms better or worse. Specifically, she's had no right upper quadrant pain or back pain that radiates through from her abdomen. She has a history of chest pain has been worked up by her cardiologist. 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  The patient is a 85 year old female.   Past Surgical History Sharyn Lull R. Brooks, CMA; 07/05/2018 11:11 AM) Hysterectomy (not due to cancer) - Complete   Diagnostic Studies History Sharyn Lull R. Brooks, CMA; 07/05/2018 11:11 AM) Mammogram  1-3 years ago  Allergies Sharyn Lull R. Brooks, CMA; 07/05/2018 11:13 AM) Codeine Phosphate *ANALGESICS - OPIOID*  Sulfa 10 *OPHTHALMIC AGENTS*   Medication History (Michelle R. Brooks, [place];  07/05/2018 11:16 AM) Triamcinolone Acetonide (0.025% Ointment, External) Active. Rosuvastatin Calcium (20MG  Tablet, Oral) Active. Nitroglycerin (0.4MG  Tab Sublingual, Sublingual) Active. Metoprolol Succinate ER (25MG  Tablet ER 24HR, Oral) Active. amLODIPine Besylate (2.5MG  Tablet, Oral) Active. Azopt (1% Suspension, Ophthalmic) Active. Calcium (Oral) Specific strength unknown - Active. Medications Reconciled  Social History Sharyn Lull R. Brooks, CMA; 07/05/2018 11:11 AM) Caffeine use  Carbonated beverages, Coffee. No drug use  Tobacco use  Never smoker.  Family History Sharyn Lull R. Rolena Infante, CMA; 07/05/2018 11:11 AM) Cerebrovascular Accident  Mother. Heart Disease  Brother, Father. Kidney Disease  Father.  Pregnancy / Birth History Sharyn Lull R. Rolena Infante, CMA; 07/05/2018 11:11 AM) Maternal age  30-25 Para  4  Other Problems Sharyn Lull R. Brooks, CMA; 07/05/2018 11:11 AM) Cholelithiasis  Gastroesophageal Reflux Disease     Review of Systems (Lunna Vogelgesang A. Gilad Dugger MD; 07/05/2018 12:15 PM) General Present- Fatigue. Not Present- Appetite Loss, Chills, Fever, Night Sweats, Weight Gain and Weight Loss. Cardiovascular Not Present- Chest Pain, Difficulty Breathing Lying Down, Leg Cramps, Palpitations, Rapid Heart Rate, Shortness of Breath and Swelling of Extremities. Gastrointestinal Present- Hemorrhoids. Not Present- Abdominal Pain, Bloating, Bloody Stool, Change in Bowel Habits, Chronic diarrhea, Constipation, Difficulty Swallowing, Excessive gas, Gets full quickly at meals, Indigestion, Nausea, Rectal Pain and Vomiting. Female Genitourinary Present- Nocturia. Not Present- Frequency, Painful Urination, Pelvic Pain and Urgency. Psychiatric Not Present- Anxiety, Bipolar, Change in Sleep Pattern, Depression, Fearful and Frequent crying. Endocrine Not Present- Cold Intolerance, Excessive Hunger, Hair Changes, Heat Intolerance, Hot flashes and New Diabetes. Hematology Not Present- Blood  Thinners, Easy Bruising, Excessive bleeding, Gland problems, HIV  and Persistent Infections. All other systems negative  Vitals [employer] R. Brooks CMA; 07/05/2018 11:10 AM) 07/05/2018 11:10 AM Weight: 132.38 lb Height: 61in Body Surface Area: 1.59 m Body Mass Index: 25.01 kg/m  BP: 130/86 (Sitting, Left Arm, Standard)       Physical Exam (Shanora Christensen A. Israel Wunder MD; 07/05/2018 12:14 PM) General Mental Status-Alert. General Appearance-Consistent with stated age. Hydration-Well hydrated. Voice-Normal.  Head and Neck Head-normocephalic, atraumatic with no lesions or palpable masses. Trachea-midline. Thyroid Gland Characteristics - normal size and consistency.  Eye Eyeball - Bilateral-Extraocular movements intact. Sclera/Conjunctiva - Bilateral-No scleral icterus.  Chest and Lung Exam Chest and lung exam reveals -quiet, even and easy respiratory effort with no use of accessory muscles and on auscultation, normal breath sounds, no adventitious sounds and normal vocal resonance. Inspection Chest Wall - Normal. Back - normal.  Breast Breast - Left-Symmetric, Non Tender, No Biopsy scars, no Dimpling, No Inflammation, No Lumpectomy scars, No Mastectomy scars, No Peau d' Orange. Breast - Right-Symmetric, Non Tender, No Biopsy scars, no Dimpling, No Inflammation, No Lumpectomy scars, No Mastectomy scars, No Peau d' Orange. Breast Lump-No Palpable Breast Mass.  Cardiovascular Cardiovascular examination reveals -normal heart sounds, regular rate and rhythm with no murmurs and normal pedal pulses bilaterally.  Abdomen Inspection Inspection of the abdomen reveals - No Hernias. Skin - Scar - no surgical scars. Palpation/Percussion Palpation and Percussion of the abdomen reveal - Soft, Non Tender, No Rebound tenderness, No Rigidity (guarding) and No hepatosplenomegaly. Auscultation Auscultation of the abdomen reveals - Bowel sounds  normal.  Neurologic Neurologic evaluation reveals -alert and oriented x 3 with no impairment of recent or remote memory. Mental Status-Normal.  Musculoskeletal Normal Exam - Left-Upper Extremity Strength Normal and Lower Extremity Strength Normal. Normal Exam - Right-Upper Extremity Strength Normal and Lower Extremity Strength Normal.  Lymphatic Head & Neck  General Head & Neck Lymphatics: Bilateral - Description - Normal. Axillary  General Axillary Region: Bilateral - Description - Normal. Tenderness - Non Tender. Femoral & Inguinal  Generalized Femoral & Inguinal Lymphatics: Bilateral - Description - Normal. Tenderness - Non Tender.    Assessment & Plan (Nneka Blanda A. Kidada Ging MD; 07/05/2018 12:15 PM) NAUSEA IN ADULT (R11.0) Impression: The patient has a multitude of symptoms. She requires further workup and I do concerned it concerns about her headache interchangeably behavior according to her friend with her today. Recommend a head CT to evaluate her headache as well as change in behavior, chest abdomen and pelvis CT to evaluate her back pain and abdominal nausea complaints and a HIDA study to further evaluate her gallbladder function. There was some concern this could be a gallbladder malignancy and a CT scan would be helpful to sort that out. If that were the case she could have metastases to her brain organs that could be contributory to her overall decline. If workup is negative, we can discuss cholecystectomy at that point in time. I will check her liver function studies and repeat her urinalysis given her past history and further recommendations once her workup is done. Current Plans Pt Education - [facility] Free Text Education/Instructions: discussed with patient and provided information.   Signed by Turner Daniels, MD (07/05/2018 12:15 PM)

## 2018-07-30 NOTE — H&P (Signed)
Loa Socks Documented: 07/05/2018 11:10 AM Location: [facility] Patient #: [id number] DOB: 27-Oct-1931 Single / Language: Cleophus Molt / Race: White Female   History of Present Illness Marcello Moores A. Delane Wessinger MD; 07/05/2018 12:14 PM) Patient words: Patient sent at the request of Mattie Marlin PA for nausea and failure to thrive. The patient states she was in good health up until 3-4 weeks ago when she began to complain of headache, constipation, nausea, and low back pain. She is very active with her church and the [facility]. She has had no energy and complains now of knee pain and overall feeling of fatigue and malaise. She has no cough, chest pain, shortness of breath or nasal congestion. She describes low back pain just above her hips and knee pain. An ultrasound was obtained which shows a thickened gallbladder with either stones or sludge and this gallbladder is dilated. There is no signs of any gallbladder wall thickening or acute process. Since she had a UTI recently. She has no other workup that I can find a few application care everywhere. She denies any itching or yellowing of the skin. She denies any abdominal pain and her diet does not seem to make her symptoms better or worse. Specifically, she's had no right upper quadrant pain or back pain that radiates through from her abdomen. She has a history of chest pain has been worked up by her cardiologist. 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  The patient is a 85 year old female.   Past Surgical History Sharyn Lull R. Brooks, CMA; 07/05/2018 11:11 AM) Hysterectomy (not due to cancer) - Complete   Diagnostic Studies History Sharyn Lull R. Brooks, CMA; 07/05/2018 11:11 AM) Mammogram  1-3 years ago  Allergies Sharyn Lull R. Brooks, CMA; 07/05/2018 11:13 AM) Codeine Phosphate *ANALGESICS - OPIOID*  Sulfa 10 *OPHTHALMIC AGENTS*   Medication History (Michelle R. Brooks, [place];  07/05/2018 11:16 AM) Triamcinolone Acetonide (0.025% Ointment, External) Active. Rosuvastatin Calcium (20MG  Tablet, Oral) Active. Nitroglycerin (0.4MG  Tab Sublingual, Sublingual) Active. Metoprolol Succinate ER (25MG  Tablet ER 24HR, Oral) Active. amLODIPine Besylate (2.5MG  Tablet, Oral) Active. Azopt (1% Suspension, Ophthalmic) Active. Calcium (Oral) Specific strength unknown - Active. Medications Reconciled  Social History Sharyn Lull R. Brooks, CMA; 07/05/2018 11:11 AM) Caffeine use  Carbonated beverages, Coffee. No drug use  Tobacco use  Never smoker.  Family History Sharyn Lull R. Rolena Infante, CMA; 07/05/2018 11:11 AM) Cerebrovascular Accident  Mother. Heart Disease  Brother, Father. Kidney Disease  Father.  Pregnancy / Birth History Sharyn Lull R. Rolena Infante, CMA; 07/05/2018 11:11 AM) Maternal age  48-25 Para  4  Other Problems Sharyn Lull R. Brooks, CMA; 07/05/2018 11:11 AM) Cholelithiasis  Gastroesophageal Reflux Disease     Review of Systems (Tor Tsuda A. Yifan Auker MD; 07/05/2018 12:15 PM) General Present- Fatigue. Not Present- Appetite Loss, Chills, Fever, Night Sweats, Weight Gain and Weight Loss. Cardiovascular Not Present- Chest Pain, Difficulty Breathing Lying Down, Leg Cramps, Palpitations, Rapid Heart Rate, Shortness of Breath and Swelling of Extremities. Gastrointestinal Present- Hemorrhoids. Not Present- Abdominal Pain, Bloating, Bloody Stool, Change in Bowel Habits, Chronic diarrhea, Constipation, Difficulty Swallowing, Excessive gas, Gets full quickly at meals, Indigestion, Nausea, Rectal Pain and Vomiting. Female Genitourinary Present- Nocturia. Not Present- Frequency, Painful Urination, Pelvic Pain and Urgency. Psychiatric Not Present- Anxiety, Bipolar, Change in Sleep Pattern, Depression, Fearful and Frequent crying. Endocrine Not Present- Cold Intolerance, Excessive Hunger, Hair Changes, Heat Intolerance, Hot flashes and New Diabetes. Hematology Not Present- Blood  Thinners, Easy Bruising, Excessive bleeding, Gland problems, HIV  and Persistent Infections. All other systems negative  Vitals [employer] R. Brooks CMA; 07/05/2018 11:10 AM) 07/05/2018 11:10 AM Weight: 132.38 lb Height: 61in Body Surface Area: 1.59 m Body Mass Index: 25.01 kg/m  BP: 130/86 (Sitting, Left Arm, Standard)       Physical Exam (Darryl Blumenstein A. Chaunice Obie MD; 07/05/2018 12:14 PM) General Mental Status-Alert. General Appearance-Consistent with stated age. Hydration-Well hydrated. Voice-Normal.  Head and Neck Head-normocephalic, atraumatic with no lesions or palpable masses. Trachea-midline. Thyroid Gland Characteristics - normal size and consistency.  Eye Eyeball - Bilateral-Extraocular movements intact. Sclera/Conjunctiva - Bilateral-No scleral icterus.  Chest and Lung Exam Chest and lung exam reveals -quiet, even and easy respiratory effort with no use of accessory muscles and on auscultation, normal breath sounds, no adventitious sounds and normal vocal resonance. Inspection Chest Wall - Normal. Back - normal.  Breast Breast - Left-Symmetric, Non Tender, No Biopsy scars, no Dimpling, No Inflammation, No Lumpectomy scars, No Mastectomy scars, No Peau d' Orange. Breast - Right-Symmetric, Non Tender, No Biopsy scars, no Dimpling, No Inflammation, No Lumpectomy scars, No Mastectomy scars, No Peau d' Orange. Breast Lump-No Palpable Breast Mass.  Cardiovascular Cardiovascular examination reveals -normal heart sounds, regular rate and rhythm with no murmurs and normal pedal pulses bilaterally.  Abdomen Inspection Inspection of the abdomen reveals - No Hernias. Skin - Scar - no surgical scars. Palpation/Percussion Palpation and Percussion of the abdomen reveal - Soft, Non Tender, No Rebound tenderness, No Rigidity (guarding) and No hepatosplenomegaly. Auscultation Auscultation of the abdomen reveals - Bowel sounds  normal.  Neurologic Neurologic evaluation reveals -alert and oriented x 3 with no impairment of recent or remote memory. Mental Status-Normal.  Musculoskeletal Normal Exam - Left-Upper Extremity Strength Normal and Lower Extremity Strength Normal. Normal Exam - Right-Upper Extremity Strength Normal and Lower Extremity Strength Normal.  Lymphatic Head & Neck  General Head & Neck Lymphatics: Bilateral - Description - Normal. Axillary  General Axillary Region: Bilateral - Description - Normal. Tenderness - Non Tender. Femoral & Inguinal  Generalized Femoral & Inguinal Lymphatics: Bilateral - Description - Normal. Tenderness - Non Tender.    Assessment & Plan (Zuleyma Scharf A. Altha Sweitzer MD; 07/05/2018 12:15 PM) NAUSEA IN ADULT (R11.0) Impression: The patient has a multitude of symptoms. She requires further workup and I do concerned it concerns about her headache interchangeably behavior according to her friend with her today. Recommend a head CT to evaluate her headache as well as change in behavior, chest abdomen and pelvis CT to evaluate her back pain and abdominal nausea complaints and a HIDA study to further evaluate her gallbladder function. There was some concern this could be a gallbladder malignancy and a CT scan would be helpful to sort that out. If that were the case she could have metastases to her brain organs that could be contributory to her overall decline. If workup is negative, we can discuss cholecystectomy at that point in time. I will check her liver function studies and repeat her urinalysis given her past history and further recommendations once her workup is done. Current Plans Pt Education - [facility] Free Text Education/Instructions: discussed with patient and provided information.   Signed by Turner Daniels, MD (07/05/2018 12:15 PM)

## 2018-08-04 ENCOUNTER — Other Ambulatory Visit: Payer: Self-pay

## 2018-08-04 DIAGNOSIS — E785 Hyperlipidemia, unspecified: Secondary | ICD-10-CM

## 2018-08-04 NOTE — Progress Notes (Signed)
The patient has been notified of the result and verbalized understanding.  All questions (if any) were answered. Jacqulynn Cadet, CMA 08/04/2018 1:40 PM

## 2018-08-06 ENCOUNTER — Ambulatory Visit: Payer: Medicare Other | Admitting: Cardiovascular Disease

## 2018-08-10 NOTE — Pre-Procedure Instructions (Signed)
Julie Petty  08/10/2018      [facility] [address], [place] - [address] [place] [name] [address] Phone: [phone] Fax: [phone]    Your procedure is scheduled on February 25th.  Report to [facility] Entrance "A" Admitting at 10:30 A.M.  Call this number if you have problems the morning of surgery:  [phone]   Remember:  Do not eat or drink after midnight.     Take these medicines the morning of surgery with A SIP OF WATER  Eye Drops - if needed  Metoprolol  Nitroglycerin - if needed  7 days prior to surgery STOP taking any Aspirin (unless otherwise instructed by your surgeon), Aleve, Naproxen, Ibuprofen, Motrin, Advil, Goody's, BC's, all herbal medications, fish oil, and all vitamins.     Do not wear jewelry, make-up or nail polish.  Do not wear lotions, powders, or perfumes, or deodorant.  Do not shave 48 hours prior to surgery.    Do not bring valuables to the hospital.  [facility] is not responsible for any belongings or valuables.   [place]- Preparing For Surgery  Before surgery, you can play an important role. Because skin is not sterile, your skin needs to be as free of germs as possible. You can reduce the number of germs on your skin by washing with CHG (chlorahexidine gluconate) Soap before surgery.  CHG is an antiseptic cleaner which kills germs and bonds with the skin to continue killing germs even after washing.    Oral Hygiene is also important to reduce your risk of infection.  Remember - BRUSH YOUR TEETH THE MORNING OF SURGERY WITH YOUR REGULAR TOOTHPASTE  Please do not use if you have an allergy to CHG or antibacterial soaps. If your skin becomes reddened/irritated stop using the CHG.  Do not shave (including legs and underarms) for at least 48 hours prior to first CHG shower. It is OK to shave your face.  Please follow these instructions carefully.   1. Shower the NIGHT BEFORE SURGERY and  the MORNING OF SURGERY with CHG.   2. If you chose to wash your hair, wash your hair first as usual with your normal shampoo.  3. After you shampoo, rinse your hair and body thoroughly to remove the shampoo.  4. Use CHG as you would any other liquid soap. You can apply CHG directly to the skin and wash gently with a scrungie or a clean washcloth.   5. Apply the CHG Soap to your body ONLY FROM THE NECK DOWN.  Do not use on open wounds or open sores. Avoid contact with your eyes, ears, mouth and genitals (private parts). Wash Face and genitals (private parts)  with your normal soap.  6. Wash thoroughly, paying special attention to the area where your surgery will be performed.  7. Thoroughly rinse your body with warm water from the neck down.  8. DO NOT shower/wash with your normal soap after using and rinsing off the CHG Soap.  9. Pat yourself dry with a CLEAN TOWEL.  10. Wear CLEAN PAJAMAS to bed the night before surgery, wear comfortable clothes the morning of surgery  11. Place CLEAN SHEETS on your bed the night of your first shower and DO NOT SLEEP WITH PETS.   Day of Surgery:  Do not apply any deodorants/lotions.  Please wear clean clothes to the hospital/surgery center.   Remember to brush your teeth WITH YOUR REGULAR TOOTHPASTE.   Contacts, dentures  or bridgework may not be worn into surgery.  Leave your suitcase in the car.  After surgery it may be brought to your room.  For patients admitted to the hospital, discharge time will be determined by your treatment team.  Patients discharged the day of surgery will not be allowed to drive home.   Please read over the following fact sheets that you were given. Coughing and Deep Breathing and Surgical Site Infection Prevention

## 2018-08-11 ENCOUNTER — Encounter (HOSPITAL_COMMUNITY): Payer: Self-pay

## 2018-08-11 ENCOUNTER — Encounter (HOSPITAL_COMMUNITY)
Admission: RE | Admit: 2018-08-11 | Discharge: 2018-08-11 | Disposition: A | Discharge status: Home / Self Care | Payer: Medicare Other | Source: Ambulatory Visit | Attending: Surgery | Admitting: Surgery

## 2018-08-11 ENCOUNTER — Other Ambulatory Visit: Payer: Self-pay

## 2018-08-11 DIAGNOSIS — Z01812 Encounter for preprocedural laboratory examination: Secondary | ICD-10-CM | POA: Diagnosis present

## 2018-08-11 HISTORY — DX: Other complications of anesthesia, initial encounter: T88.59XA

## 2018-08-11 HISTORY — DX: Other specified postprocedural states: Z98.890

## 2018-08-11 HISTORY — DX: Other specified postprocedural states: R11.2

## 2018-08-11 HISTORY — DX: Adverse effect of unspecified anesthetic, initial encounter: T41.45XA

## 2018-08-11 HISTORY — DX: Acute myocardial infarction, unspecified: I21.9

## 2018-08-11 LAB — COMPREHENSIVE METABOLIC PANEL
ALT: 23 U/L (ref 0–44)
AST: 25 U/L (ref 15–41)
Albumin: 3.8 g/dL (ref 3.5–5.0)
Alkaline Phosphatase: 44 U/L (ref 38–126)
Anion gap: 10 (ref 5–15)
BUN: 18 mg/dL (ref 8–23)
CO2: 24 mmol/L (ref 22–32)
Calcium: 10.2 mg/dL (ref 8.9–10.3)
Chloride: 103 mmol/L (ref 98–111)
Creatinine, Ser: 0.93 mg/dL (ref 0.44–1.00)
GFR, EST NON AFRICAN AMERICAN: 56 mL/min — AB (ref >60)
Glucose, Bld: 100 mg/dL — ABNORMAL HIGH (ref 70–99)
Potassium: 4.4 mmol/L (ref 3.5–5.1)
SODIUM: 137 mmol/L (ref 135–145)
Total Bilirubin: 1.1 mg/dL (ref 0.3–1.2)
Total Protein: 6.9 g/dL (ref 6.5–8.1)

## 2018-08-11 LAB — CBC WITH DIFFERENTIAL/PLATELET
Abs Immature Granulocytes: 0.01 10*3/uL (ref 0.00–0.07)
Basophils Absolute: 0 10*3/uL (ref 0.0–0.1)
Basophils Relative: 0 %
Eosinophils Absolute: 0.2 10*3/uL (ref 0.0–0.5)
Eosinophils Relative: 3 %
HCT: 42.8 % (ref 36.0–46.0)
Hemoglobin: 13.7 g/dL (ref 12.0–15.0)
Immature Granulocytes: 0 %
LYMPHS ABS: 1.1 10*3/uL (ref 0.7–4.0)
Lymphocytes Relative: 17 %
MCH: 32.5 pg (ref 26.0–34.0)
MCHC: 32 g/dL (ref 30.0–36.0)
MCV: 101.4 fL — AB (ref 80.0–100.0)
Monocytes Absolute: 0.5 10*3/uL (ref 0.1–1.0)
Monocytes Relative: 8 %
Neutro Abs: 4.9 10*3/uL (ref 1.7–7.7)
Neutrophils Relative %: 72 %
PLATELETS: 266 10*3/uL (ref 150–400)
RBC: 4.22 MIL/uL (ref 3.87–5.11)
RDW: 12.9 % (ref 11.5–15.5)
WBC: 6.8 10*3/uL (ref 4.0–10.5)
nRBC: 0 % (ref 0.0–0.2)

## 2018-08-11 NOTE — Pre-Procedure Instructions (Signed)
Julie Petty  08/11/2018      [facility] [address] [place] - [address] [place] [name] [address] Phone: [phone] Fax: [phone]    Your procedure is scheduled on August 17, 2018.  Report to [facility] Admitting at 1030 AM.  Call this number if you have problems the morning of surgery:  [phone]   Remember:  Do not eat after midnight.  You may drink clear liquids until 9:30 AM.  Clear liquids allowed are:                    Water, Juice (non-citric and without pulp), Clear Tea, Black Coffee only and Gatorade    Take these medicines the morning of surgery with A SIP OF WATER  Metoprolol succinate (Toprol-XL) Eye Drops - if needed Nitroglycerin - if needed for chest pain    Do not wear jewelry, make-up or nail polish.  Do not wear lotions, powders, or perfumes, or deodorant.  Do not shave 48 hours prior to surgery.    Do not bring valuables to the hospital.  [facility] is not responsible for any belongings or valuables.  Contacts, dentures or bridgework may not be worn into surgery.  Leave your suitcase in the car.  After surgery it may be brought to your room.  For patients admitted to the hospital, discharge time will be determined by your treatment team.  Patients discharged the day of surgery will not be allowed to drive home.   [place]- Preparing For Surgery  Before surgery, you can play an important role. Because skin is not sterile, your skin needs to be as free of germs as possible. You can reduce the number of germs on your skin by washing with CHG (chlorahexidine gluconate) Soap before surgery.  CHG is an antiseptic cleaner which kills germs and bonds with the skin to continue killing germs even after washing.    Oral Hygiene is also important to reduce your risk of infection.  Remember - BRUSH YOUR TEETH THE MORNING OF SURGERY WITH YOUR REGULAR TOOTHPASTE  Please do not use if you have  an allergy to CHG or antibacterial soaps. If your skin becomes reddened/irritated stop using the CHG.  Do not shave (including legs and underarms) for at least 48 hours prior to first CHG shower. It is OK to shave your face.  Please follow these instructions carefully.   1. Shower the NIGHT BEFORE SURGERY and the MORNING OF SURGERY with CHG.   2. If you chose to wash your hair, wash your hair first as usual with your normal shampoo.  3. After you shampoo, rinse your hair and body thoroughly to remove the shampoo.  4. Use CHG as you would any other liquid soap. You can apply CHG directly to the skin and wash gently with a scrungie or a clean washcloth.   5. Apply the CHG Soap to your body ONLY FROM THE NECK DOWN.  Do not use on open wounds or open sores. Avoid contact with your eyes, ears, mouth and genitals (private parts). Wash Face and genitals (private parts)  with your normal soap.  6. Wash thoroughly, paying special attention to the area where your surgery will be performed.  7. Thoroughly rinse your body with warm water from the neck down.  8. DO NOT shower/wash with your normal soap after using and rinsing off the CHG Soap.  9. Pat yourself dry with a CLEAN TOWEL.  10. Wear CLEAN PAJAMAS to bed the night before surgery, wear comfortable clothes the morning of surgery  11. Place CLEAN SHEETS on your bed the night of your first shower and DO NOT SLEEP WITH PETS.  Day of Surgery:  Do not apply any deodorants/lotions.  Please wear clean clothes to the hospital/surgery center.   Remember to brush your teeth WITH YOUR REGULAR TOOTHPASTE.   Please read over the following fact sheets that you were given.

## 2018-08-11 NOTE — Progress Notes (Addendum)
PCP - Roe Coombs, PA-C Cardiologist - Marylou Flesher, MD  Chest x-ray - 04/24/18-1 view in EPIC EKG - 04/28/18 in EPIC  Stress Test - pt denies past 5-10 year ECHO - 04/25/18 in EPIC  Cardiac Cath - 04/26/18 in EPIC  Sleep Study -  Pt denies CPAP - n/a  Fasting Blood Sugar - n/a Checks Blood Sugar _____ times a day-n/a  Blood Thinner Instructions: n/a Aspirin Instructions: n/a  Anesthesia review: Yes, heart history  Patient denies shortness of breath, fever, cough and chest pain at PAT appointment  Patient verbalized understanding of instructions that were given to them at the PAT appointment. Patient was also instructed that they will need to review over the PAT instructions again at home before surgery.

## 2018-08-12 ENCOUNTER — Ambulatory Visit (HOSPITAL_COMMUNITY): Payer: Medicare Other

## 2018-08-12 ENCOUNTER — Encounter (HOSPITAL_COMMUNITY): Payer: Self-pay

## 2018-08-12 NOTE — Progress Notes (Addendum)
Anesthesia Chart Review:  Case:  [id number] Date/Time:  08/17/18 1215   Procedure:  LAPAROSCOPIC CHOLECYSTECTOMY (N/A )   Anesthesia type:  General   Pre-op diagnosis:  biliary dyskinesia   Location:  [facility] OR ROOM 09 / [facility]   Surgeon:  Erroll Luna, MD      DISCUSSION: Patient is an 85 year old female scheduled for the above procedure.  History includes never smoker, post-operative N/V, NSTEMI 04/24/18, cardiomyopathy (Takotsubo cardiomyopathy vs transient coronary spasm vs myocarditis 04/25/18), CAD (40% D2 04/26/18), HTN, HLD, GERD, glaucoma.   Currently with scheduled follow-up with cardiologist Dr. Sallyanne Kuster on 08/13/18, but also expecting inclement weather. Will leave chart for follow-up to see if patient able to make her appointment.    ADDENDUM 08/16/18 9:05 AM: Patient was seen by Dr. Sallyanne Kuster on 08/13/18. He noted that he thought her cardiomyopathy was more likely due to Takotsubo syndrome rather than coronary vasospasm given she had been completely asymptomatic for the last 4 months. Clinically he felt she had made a complete recovery. Since her EF was only minimally depressed in November 2019 (50-55%), he did not think it was necessary to repeat an echocardiogram. He felt she was "low risk for major cardiovascular complications with planned laparoscopic cholecystectomy." He recommended PRN cardiology follow-up.    VS: BP (!) 143/71   Pulse (!) 57   Temp 36.5 C   Ht 5\' 2"  (1.575 m)   Wt 60.7 kg   SpO2 100%   BMI 24.49 kg/m    PROVIDERS: Aura Dials, PA-C is PCP Sanda Klein, MD is cardiologist. Currently last visit 05/07/18 with Almyra Deforest, [place]. Losartan held due to complains of nausea and headaches. Continued on b-blocker, amlodipine, statin. 2-3 month follow-up with Dr. Sallyanne Kuster recommended.    LABS: Labs reviewed: Acceptable for surgery. (all labs ordered are listed, but only abnormal results are displayed)  Labs Reviewed  CBC WITH DIFFERENTIAL/PLATELET - Abnormal;  Notable for the following components:      Result Value   MCV 101.4 (*)    All other components within normal limits  COMPREHENSIVE METABOLIC PANEL - Abnormal; Notable for the following components:   Glucose, Bld 100 (*)    GFR calc non Af Amer 56 (*)    All other components within normal limits    IMAGES: MRI T-spine 07/27/18: IMPRESSION: 1. 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 2. Minimal thoracic spondylosis without stenosis or impingement.  CT chest/abd/pelvis 07/12/18: IMPRESSION: - Tiny cluster of nodules at the lateral aspect of the RIGHT middle lobe, question inflammatory; consider follow-up CT imaging in 6 months to demonstrate stability. - Single nonspecific mildly enlarged inferior mediastinal lymph node 12 mm short axis. - LEFT paraspinal nodule at T1-T2 measuring 16 x 14 x 13 mm question nerve sheath tumor; consider characterization by MR. - Sigmoid diverticulosis without evidence of diverticulitis. - Cholelithiasis. - No acute intra-abdominal or intrapelvic abnormalities.   EKG: 04/28/18: SB at 55 bpm. Sinus arrhythmia. LAD. Right BBB. T wave abnormality, consider inferolateral ischemia.   CV: Cardiac cath 19/6/22:  LV end diastolic pressure is normal.  Ost 2nd Diag lesion is 40% stenosed. Otherwise minimal coronary disease. SUMMARY  Angiographically minimal coronary artery disease -no culprit lesion found.  Normal LV filling pressures. RECOMMENDATIONS  Return to nursing unit for ongoing care.  TR band removal per protocol.  Medical management for mild CAD.  Consider the possibility of coronary spasm  versus myocarditis/ pericarditis for elevated troponin.  Recommend Aspirin 81mg  daily for moderate CAD.   Echo 04/25/18: Study Conclusions - Left ventricle: The cavity size was normal. Wall thickness was   normal. Systolic function was normal. The estimated ejection   fraction was  in the range of 50% to 55%. Distal anterior, apical   and inferoapical severe hypokinesis with a hyperdynamic base, may   suggest Takatsubo cardiomyopathy or LAD ischemia. No apical   thrombus by Definity contrast. Doppler parameters are consistent   with abnormal left ventricular relaxation (grade 1 diastolic   dysfunction). The E/e&' ratio is between 8-15, suggesting   indeterminate LV filling pressure. Ejection fraction (MOD,   2-plane): 50%. - Aortic valve: Trileaflet. Sclerosis without stenosis. There was   mild regurgitation. - Mitral valve: Mildly thickened leaflets . There was trivial   regurgitation. - Left atrium: Moderately dilated. - Right atrium: The atrium was mildly dilated. - Tricuspid valve: There was moderate regurgitation. - Pulmonary arteries: PA peak pressure: 37 mm Hg (S). - Systemic veins: The IVC measures <2.1 cm, but does not collapse   >50%, suggesting an elevated RA pressure of 8 mmHg. Impressions: - LVEF 50-55%, normal wall thickness, distal anterior, apical and   inferoapical severe hypokinesis with a hyperdynamic base,   suggestive of possible Takatsubo cardiomyopathy vs LAD ischemia,   grade 1 DD, indetermiante LV filling pressure, mild AI, trivial   MR, moderate LAE, mild RAE, moderate TR, RVSP 37 mmHg, IVC   suggests elevated RA pressure of 8 mmHg.   Past Medical History:  Diagnosis Date  . Complication of anesthesia   . Essential hypertension   . GERD (gastroesophageal reflux disease)   . Glaucoma   . Hernia   . Hyperlipemia   . PONV (postoperative nausea and vomiting)     Past Surgical History:  Procedure Laterality Date  . CARDIAC CATHETERIZATION  04/05/03  . KNEE ARTHROSCOPY  09/17/05   left  . LEFT HEART CATH AND CORONARY ANGIOGRAPHY N/A 04/26/2018   Procedure: LEFT HEART CATH AND CORONARY ANGIOGRAPHY;  Surgeon: Leonie Man, MD;  Location: [facility];  Service: Cardiovascular;  Laterality: N/A;  . VAGINAL HYSTERECTOMY   1980's    MEDICATIONS: . amLODipine (NORVASC) 5 MG tablet  . brinzolamide (AZOPT) 1 % ophthalmic suspension  . Calcium Carb-Cholecalciferol (CALCIUM 600/VITAMIN D3 PO)  . metoprolol succinate (TOPROL-XL) 25 MG 24 hr tablet  . nitroGLYCERIN (NITROSTAT) 0.4 MG SL tablet  . rosuvastatin (CRESTOR) 20 MG tablet  . triamcinolone (KENALOG) 0.025 % ointment   No current facility-administered medications for this encounter.     Myra Gianotti, PA-C Surgical Short Stay/Anesthesiology [facility] Phone [phone] [facility] Phone [phone] 08/12/2018 12:14 PM

## 2018-08-13 ENCOUNTER — Encounter: Payer: Self-pay | Admitting: Cardiovascular Disease

## 2018-08-13 ENCOUNTER — Ambulatory Visit: Payer: Medicare Other | Admitting: Cardiovascular Disease

## 2018-08-13 VITALS — BP 136/70 | HR 56 | Ht 62.0 in | Wt 136.8 lb

## 2018-08-13 DIAGNOSIS — I5181 Takotsubo syndrome: Secondary | ICD-10-CM | POA: Diagnosis not present

## 2018-08-13 DIAGNOSIS — I2510 Atherosclerotic heart disease of native coronary artery without angina pectoris: Secondary | ICD-10-CM

## 2018-08-13 DIAGNOSIS — E785 Hyperlipidemia, unspecified: Secondary | ICD-10-CM

## 2018-08-13 DIAGNOSIS — Z01810 Encounter for preprocedural cardiovascular examination: Secondary | ICD-10-CM

## 2018-08-13 DIAGNOSIS — I10 Essential (primary) hypertension: Secondary | ICD-10-CM

## 2018-08-13 MED ORDER — ATORVASTATIN CALCIUM 20 MG PO TABS: 20.0000 mg | ORAL_TABLET | ORAL | 3 refills | Status: DC

## 2018-08-13 NOTE — Progress Notes (Signed)
Cardiology Office Note:    Date:  08/14/2018   ID:  Julie Petty, Julie Petty 08-20-1931, MRN [id number]  PCP:  Aura Dials, PA-C  Cardiologist:  Sanda Klein, MD  Electrophysiologist:  None   Referring MD: Aura Dials, PA-C   Chief Complaint  Patient presents with  . Follow-up    takotsubo syndrome   History of Present Illness:    Julie Petty is a 85 y.o. female with acute chest pain and abnormal cardiac enzymes (peak troponin around 4) in November 2019 cardiac catheterization showed only a 40% stenosis in the second diagonal artery and regional wall motion was suggestive of possible takotsubo syndrome.  She has hyperlipidemia and mild hypertension.  A calcium channel blocker was ordered due to suspicion of possible coronary spasm as an alternative diagnosis.  She has done well since hospital discharge without any recurrence of chest pain and without any symptoms of heart failure.  The patient specifically denies any chest pain at rest exertion, dyspnea at rest or with exertion, orthopnea, paroxysmal nocturnal dyspnea, syncope, palpitations, focal neurological deficits, intermittent claudication, lower extremity edema, unexplained weight gain, cough, hemoptysis or wheezing.  She has symptomatic gallstones and is scheduled for laparoscopic cholecystectomy on February 25.   Past Medical History:  Diagnosis Date  . Complication of anesthesia   . Essential hypertension   . GERD (gastroesophageal reflux disease)   . Glaucoma   . Hernia   . Hyperlipemia   . Myocardial infarction ([place])    NSTEMI 04/24/18 (40% D2 by LHC; Dx: Takotsubo CM vs coronary spasm vs myocarditis)  . PONV (postoperative nausea and vomiting)     Past Surgical History:  Procedure Laterality Date  . CARDIAC CATHETERIZATION  04/05/03  . KNEE ARTHROSCOPY  09/17/05   left  . LEFT HEART CATH AND CORONARY ANGIOGRAPHY N/A 04/26/2018   Procedure: LEFT HEART CATH AND CORONARY ANGIOGRAPHY;  Surgeon:  Leonie Man, MD;  Location: [facility];  Service: Cardiovascular;  Laterality: N/A;  . VAGINAL HYSTERECTOMY  1980's    Current Medications: Current Meds  Medication Sig  . amLODipine (NORVASC) 5 MG tablet Take 0.5 tablets (2.5 mg total) by mouth at bedtime.  . brinzolamide (AZOPT) 1 % ophthalmic suspension Place 1 drop into both eyes 2 (two) times daily.   . Calcium Carb-Cholecalciferol (CALCIUM 600/VITAMIN D3 PO) Take 1 tablet by mouth every other day.  . metoprolol succinate (TOPROL-XL) 25 MG 24 hr tablet Take 0.5 tablets (12.5 mg total) by mouth daily.  . nitroGLYCERIN (NITROSTAT) 0.4 MG SL tablet Place 1 tablet (0.4 mg total) under the tongue every 5 (five) minutes x 3 doses as needed for chest pain.  Marland Kitchen triamcinolone (KENALOG) 0.025 % ointment Apply 1 application topically every other day.   . [DISCONTINUED] rosuvastatin (CRESTOR) 20 MG tablet Take 1 tablet (20 mg total) by mouth daily at 6 PM.     Allergies:   Morphine and related; Codeine; and Sulfur   Social History   Socioeconomic History  . Marital status: Divorced    Spouse name: Not on file  . Number of children: Not on file  . Years of education: Not on file  . Highest education level: Not on file  Occupational History  . Not on file  Social Needs  . Financial resource strain: Not on file  . Food insecurity:    Worry: Not on file    Inability: Not on file  . Transportation needs:    Medical: Not on  file    Non-medical: Not on file  Tobacco Use  . Smoking status: Never Smoker  . Smokeless tobacco: Never Used  Substance and Sexual Activity  . Alcohol use: No  . Drug use: Not on file  . Sexual activity: Not on file  Lifestyle  . Physical activity:    Days per week: Not on file    Minutes per session: Not on file  . Stress: Not on file  Relationships  . Social connections:    Talks on phone: Not on file    Gets together: Not on file    Attends religious service: Not on file    Active member of  club or organization: Not on file    Attends meetings of clubs or organizations: Not on file    Relationship status: Not on file  Other Topics Concern  . Not on file  Social History Narrative  . Not on file     Family History: The patient's family history includes Heart attack in her brother.  ROS:   Please see the history of present illness.     All other systems reviewed and are negative.  EKGs/Labs/Other Studies Reviewed:    The following studies were reviewed today: Echo and cardiac catheterization November 2019  EKG:  EKG is ordered today.  The ekg ordered today demonstrates sinus bradycardia, right bundle branch block, left axis deviation not quite meeting criteria for left anterior fascicular block  Recent Labs: 04/24/2018: TSH 1.259 08/11/2018: ALT 23; BUN 18; Creatinine, Ser 0.93; Hemoglobin 13.7; Platelets 266; Potassium 4.4; Sodium 137  Recent Lipid Panel    Component Value Date/Time   CHOL 147 07/30/2018 0938   TRIG 85 07/30/2018 0938   HDL 51 07/30/2018 0938   CHOLHDL 2.9 07/30/2018 0938   CHOLHDL 3.4 04/25/2018 0009   VLDL 6 04/25/2018 0009   LDLCALC 79 07/30/2018 0938    Physical Exam:    VS:  BP 136/70   Pulse (!) 56   Ht 5\' 2"  (1.575 m)   Wt 136 lb 12.8 oz (62.1 kg)   BMI 25.02 kg/m     Wt Readings from Last 3 Encounters:  08/13/18 136 lb 12.8 oz (62.1 kg)  08/11/18 133 lb 14.4 oz (60.7 kg)  05/07/18 133 lb (60.3 kg)     GEN:  Well nourished, well developed in no acute distress HEENT: Normal NECK: No JVD; No carotid bruits LYMPHATICS: No lymphadenopathy CARDIAC: RRR, no murmurs, rubs, gallops RESPIRATORY:  Clear to auscultation without rales, wheezing or rhonchi  ABDOMEN: Soft, non-tender, non-distended MUSCULOSKELETAL:  No edema; No deformity  SKIN: Warm and dry NEUROLOGIC:  Alert and oriented x 3 PSYCHIATRIC:  Normal affect   ASSESSMENT:    1. Takotsubo syndrome   2. Essential hypertension   3. Hyperlipidemia with target LDL less  than 70   4. Coronary artery disease involving native coronary artery of native heart without angina pectoris   5. Preoperative cardiovascular examination    PLAN:    In order of problems listed above:  1. Takotsubo syndrome: I believe this is a more likely diagnosis than coronary vasospasm, since she has been completely asymptomatic for the last 4 months.  Clinically she has made a complete recovery.  Her EF was only minimally depressed in November and I do not think it is necessary to repeat an echocardiogram. 2. HTN: Fair control 3. HLP: LDL cholesterol very close to target, but not quite at target <70.  She believes that in the past  her LDL cholesterol was very well controlled with atorvastatin and would like to switch back to her previous regimen. 4. CAD: Minor coronary obstruction and a small secondary branch.  Asymptomatic. 5. Low risk for major cardiovascular complications with planned laparoscopic cholecystectomy next week.   Medication Adjustments/Labs and Tests Ordered: Current medicines are reviewed at length with the patient today.  Concerns regarding medicines are outlined above.  Orders Placed This Encounter  Procedures  . EKG 12-Lead   Meds ordered this encounter  Medications  . atorvastatin (LIPITOR) 20 MG tablet    Sig: Take 1 tablet (20 mg total) by mouth every other day.    Dispense:  45 tablet    Refill:  3    Patient Instructions  Medication Instructions:  STOP ROSUVASTATIN  START ATORVASTATIN 20 MG EVERY OTHER DAY If you need a refill on your cardiac medications before your next appointment, please call your pharmacy.   Lab work: If you have labs (blood work) drawn today and your tests are completely normal, you will receive your results only by: Marland Kitchen MyChart Message (if you have MyChart) OR . A paper copy in the mail If you have any lab test that is abnormal or we need to change your treatment, we will call you to review the results.  Follow-Up: At [facility], you and your health needs are our priority.  As part of our continuing mission to provide you with exceptional heart care, we have created designated Provider Care Teams.  These Care Teams include your primary Cardiologist (physician) and Advanced Practice Providers (APPs -  Physician Assistants and Nurse Practitioners) who all work together to provide you with the care you need, when you need it.  Your physician recommends that you schedule a follow-up appointment in: AS NEEDED       Signed, Sanda Klein, MD  08/14/2018 9:59 AM    [place]

## 2018-08-13 NOTE — Patient Instructions (Signed)
Medication Instructions:  STOP ROSUVASTATIN  START ATORVASTATIN 20 MG EVERY OTHER DAY If you need a refill on your cardiac medications before your next appointment, please call your pharmacy.   Lab work: If you have labs (blood work) drawn today and your tests are completely normal, you will receive your results only by: Marland Kitchen MyChart Message (if you have MyChart) OR . A paper copy in the mail If you have any lab test that is abnormal or we need to change your treatment, we will call you to review the results.  Follow-Up: At [facility], you and your health needs are our priority.  As part of our continuing mission to provide you with exceptional heart care, we have created designated Provider Care Teams.  These Care Teams include your primary Cardiologist (physician) and Advanced Practice Providers (APPs -  Physician Assistants and Nurse Practitioners) who all work together to provide you with the care you need, when you need it.  Your physician recommends that you schedule a follow-up appointment in: AS NEEDED

## 2018-08-14 ENCOUNTER — Encounter: Payer: Self-pay | Admitting: Cardiovascular Disease

## 2018-08-14 DIAGNOSIS — I2510 Atherosclerotic heart disease of native coronary artery without angina pectoris: Secondary | ICD-10-CM | POA: Insufficient documentation

## 2018-08-16 NOTE — Anesthesia Preprocedure Evaluation (Addendum)
Anesthesia Evaluation  Patient identified by MRN, date of birth, ID band Patient awake    Reviewed: Allergy & Precautions, NPO status , Patient's Chart, lab work & pertinent test results, reviewed documented beta blocker date and time   History of Anesthesia Complications (+) PONV and history of anesthetic complications  Airway Mallampati: III  TM Distance: >3 FB Neck ROM: Full    Dental  (+) Dental Advisory Given, Teeth Intact   Pulmonary neg pulmonary ROS,    breath sounds clear to auscultation       Cardiovascular hypertension, Pt. on medications and Pt. on home beta blockers + CAD and + Past MI (2019 - Takotsubo vs. coronary vasospasm vs. myocarditis)   Rhythm:Regular Rate:Normal   '19 TTE - EF 50% to 55%. Distal anterior, apical   and inferoapical severe hypokinesis with a hyperdynamic base, may suggest Takatsubo cardiomyopathy or LAD ischemia. Grade 1 diastolic   dysfunction. The E/e&' ratio is between 8-15, suggesting   indeterminate LV filling pressure. Mild AI. Trivial MR. LA mderately dilated. RA was mildly dilated. Moderate TR. PASP 37 mmHg.  '19 Cath - LV end diastolic pressure is normal. Ost 2nd Diag lesion is 40% stenosed. Otherwise minimal coronary disease.    Neuro/Psych  Motion sickness  negative psych ROS   GI/Hepatic Neg liver ROS, GERD  Controlled,  Endo/Other  negative endocrine ROS  Renal/GU negative Renal ROS     Musculoskeletal negative musculoskeletal ROS (+)   Abdominal   Peds  Hematology negative hematology ROS (+)   Anesthesia Other Findings   Reproductive/Obstetrics                           Anesthesia Physical Anesthesia Plan  ASA: III  Anesthesia Plan: General   Post-op Pain Management:    Induction: Intravenous  PONV Risk Score and Plan: 4 or greater and Treatment may vary due to age or medical condition, TIVA and Ondansetron  Airway  Management Planned: Oral ETT  Additional Equipment: None  Intra-op Plan:   Post-operative Plan: Extubation in OR  Informed Consent: I have reviewed the patients History and Physical, chart, labs and discussed the procedure including the risks, benefits and alternatives for the proposed anesthesia with the patient or authorized representative who has indicated his/her understanding and acceptance.     Dental advisory given  Plan Discussed with: CRNA and Anesthesiologist  Anesthesia Plan Comments:      Anesthesia Quick Evaluation

## 2018-08-17 ENCOUNTER — Ambulatory Visit (HOSPITAL_COMMUNITY)
Admission: RE | Admit: 2018-08-17 | Discharge: 2018-08-17 | Disposition: A | Discharge status: Home / Self Care | Payer: Medicare Other | Attending: Surgery | Admitting: Surgery

## 2018-08-17 ENCOUNTER — Encounter (HOSPITAL_COMMUNITY)
Admission: RE | Disposition: A | Discharge status: Home / Self Care | Payer: Self-pay | Source: Home / Self Care | Attending: Surgery

## 2018-08-17 ENCOUNTER — Ambulatory Visit (HOSPITAL_COMMUNITY): Payer: Medicare Other | Admitting: Vascular Surgery

## 2018-08-17 ENCOUNTER — Other Ambulatory Visit: Payer: Self-pay

## 2018-08-17 ENCOUNTER — Encounter (HOSPITAL_COMMUNITY): Payer: Self-pay

## 2018-08-17 DIAGNOSIS — Z885 Allergy status to narcotic agent status: Secondary | ICD-10-CM | POA: Diagnosis not present

## 2018-08-17 DIAGNOSIS — K811 Chronic cholecystitis: Secondary | ICD-10-CM | POA: Diagnosis present

## 2018-08-17 DIAGNOSIS — I2510 Atherosclerotic heart disease of native coronary artery without angina pectoris: Secondary | ICD-10-CM | POA: Insufficient documentation

## 2018-08-17 DIAGNOSIS — Z79899 Other long term (current) drug therapy: Secondary | ICD-10-CM | POA: Insufficient documentation

## 2018-08-17 DIAGNOSIS — I10 Essential (primary) hypertension: Secondary | ICD-10-CM | POA: Insufficient documentation

## 2018-08-17 DIAGNOSIS — K8010 Calculus of gallbladder with chronic cholecystitis without obstruction: Secondary | ICD-10-CM | POA: Insufficient documentation

## 2018-08-17 DIAGNOSIS — Z882 Allergy status to sulfonamides status: Secondary | ICD-10-CM | POA: Insufficient documentation

## 2018-08-17 DIAGNOSIS — K219 Gastro-esophageal reflux disease without esophagitis: Secondary | ICD-10-CM | POA: Insufficient documentation

## 2018-08-17 DIAGNOSIS — I252 Old myocardial infarction: Secondary | ICD-10-CM | POA: Diagnosis not present

## 2018-08-17 DIAGNOSIS — Z419 Encounter for procedure for purposes other than remedying health state, unspecified: Secondary | ICD-10-CM

## 2018-08-17 HISTORY — PX: CHOLECYSTECTOMY: SHX55

## 2018-08-17 SURGERY — LAPAROSCOPIC CHOLECYSTECTOMY
Anesthesia: General | Site: Abdomen

## 2018-08-17 MED ORDER — BUPIVACAINE HCL (PF) 0.25 % IJ SOLN
INTRAMUSCULAR | Status: AC
  Filled 2018-08-17: qty 30

## 2018-08-17 MED ORDER — SUGAMMADEX SODIUM 200 MG/2ML IV SOLN
INTRAVENOUS | Status: DC | PRN
  Administered 2018-08-17: 200 mg via INTRAVENOUS

## 2018-08-17 MED ORDER — ROCURONIUM BROMIDE 10 MG/ML (PF) SYRINGE
PREFILLED_SYRINGE | INTRAVENOUS | Status: DC | PRN
  Administered 2018-08-17: 50 mg via INTRAVENOUS
  Administered 2018-08-17: 10 mg via INTRAVENOUS

## 2018-08-17 MED ORDER — SODIUM CHLORIDE 0.9 % IR SOLN
Status: DC | PRN
  Administered 2018-08-17: 1000 mL

## 2018-08-17 MED ORDER — DEXAMETHASONE SODIUM PHOSPHATE 10 MG/ML IJ SOLN
INTRAMUSCULAR | Status: DC | PRN
  Administered 2018-08-17: 5 mg via INTRAVENOUS

## 2018-08-17 MED ORDER — 0.9 % SODIUM CHLORIDE (POUR BTL) OPTIME
TOPICAL | Status: DC | PRN
  Administered 2018-08-17: 1000 mL

## 2018-08-17 MED ORDER — SODIUM CHLORIDE 0.9 % IV SOLN
INTRAVENOUS | Status: DC | PRN
  Administered 2018-08-17: 100 mL

## 2018-08-17 MED ORDER — FENTANYL CITRATE (PF) 100 MCG/2ML IJ SOLN
INTRAMUSCULAR | Status: DC | PRN
  Administered 2018-08-17: 25 ug via INTRAVENOUS
  Administered 2018-08-17: 50 ug via INTRAVENOUS
  Administered 2018-08-17: 75 ug via INTRAVENOUS

## 2018-08-17 MED ORDER — ACETAMINOPHEN 500 MG PO TABS
1000.0000 mg | ORAL_TABLET | ORAL | Status: AC
  Administered 2018-08-17: 1000 mg via ORAL

## 2018-08-17 MED ORDER — LIDOCAINE 2% (20 MG/ML) 5 ML SYRINGE
INTRAMUSCULAR | Status: DC | PRN
  Administered 2018-08-17: 60 mg via INTRAVENOUS

## 2018-08-17 MED ORDER — ONDANSETRON HCL 4 MG/2ML IJ SOLN
4.0000 mg | Freq: Once | INTRAMUSCULAR | Status: AC | PRN
  Administered 2018-08-17: 4 mg via INTRAVENOUS

## 2018-08-17 MED ORDER — PHENYLEPHRINE 40 MCG/ML (10ML) SYRINGE FOR IV PUSH (FOR BLOOD PRESSURE SUPPORT)
PREFILLED_SYRINGE | INTRAVENOUS | Status: DC | PRN
  Administered 2018-08-17: 80 ug via INTRAVENOUS

## 2018-08-17 MED ORDER — PROPOFOL 1000 MG/100ML IV EMUL
INTRAVENOUS | Status: AC
  Filled 2018-08-17: qty 200

## 2018-08-17 MED ORDER — CELECOXIB 200 MG PO CAPS
200.0000 mg | ORAL_CAPSULE | ORAL | Status: AC
  Administered 2018-08-17: 200 mg via ORAL

## 2018-08-17 MED ORDER — GABAPENTIN 300 MG PO CAPS
ORAL_CAPSULE | ORAL | Status: AC
  Administered 2018-08-17: 300 mg via ORAL
  Filled 2018-08-17: qty 1

## 2018-08-17 MED ORDER — PROPOFOL 10 MG/ML IV BOLUS
INTRAVENOUS | Status: DC | PRN
  Administered 2018-08-17: 120 mg via INTRAVENOUS

## 2018-08-17 MED ORDER — FENTANYL CITRATE (PF) 250 MCG/5ML IJ SOLN
INTRAMUSCULAR | Status: AC
  Filled 2018-08-17: qty 5

## 2018-08-17 MED ORDER — BUPIVACAINE HCL 0.25 % IJ SOLN
INTRAMUSCULAR | Status: DC | PRN
  Administered 2018-08-17: 9 mL

## 2018-08-17 MED ORDER — CHLORHEXIDINE GLUCONATE CLOTH 2 % EX PADS: 6.0000 | MEDICATED_PAD | Freq: Once | CUTANEOUS | Status: DC

## 2018-08-17 MED ORDER — CEFAZOLIN SODIUM-DEXTROSE 2-4 GM/100ML-% IV SOLN
2.0000 g | INTRAVENOUS | Status: AC
  Administered 2018-08-17: 2 g via INTRAVENOUS

## 2018-08-17 MED ORDER — FENTANYL CITRATE (PF) 100 MCG/2ML IJ SOLN
25.0000 ug | INTRAMUSCULAR | Status: DC | PRN
  Administered 2018-08-17 (×2): 25 ug via INTRAVENOUS

## 2018-08-17 MED ORDER — HYDROCODONE-ACETAMINOPHEN 5-325 MG PO TABS: 1.0000 | ORAL_TABLET | Freq: Four times a day (QID) | ORAL | 0 refills | Status: DC | PRN

## 2018-08-17 MED ORDER — PROPOFOL 10 MG/ML IV BOLUS
INTRAVENOUS | Status: AC
  Filled 2018-08-17: qty 20

## 2018-08-17 MED ORDER — PROPOFOL 500 MG/50ML IV EMUL
INTRAVENOUS | Status: DC | PRN
  Administered 2018-08-17: 150 ug/kg/min via INTRAVENOUS

## 2018-08-17 MED ORDER — LACTATED RINGERS IV SOLN
INTRAVENOUS | Status: DC | PRN
  Administered 2018-08-17 (×2): via INTRAVENOUS

## 2018-08-17 MED ORDER — IOPAMIDOL (ISOVUE-300) INJECTION 61%
INTRAVENOUS | Status: AC
  Filled 2018-08-17: qty 50

## 2018-08-17 MED ORDER — FENTANYL CITRATE (PF) 100 MCG/2ML IJ SOLN
INTRAMUSCULAR | Status: AC
  Filled 2018-08-17: qty 2

## 2018-08-17 MED ORDER — ACETAMINOPHEN 500 MG PO TABS
ORAL_TABLET | ORAL | Status: AC
  Administered 2018-08-17: 1000 mg via ORAL
  Filled 2018-08-17: qty 2

## 2018-08-17 MED ORDER — ONDANSETRON HCL 4 MG/2ML IJ SOLN
INTRAMUSCULAR | Status: DC | PRN
  Administered 2018-08-17: 4 mg via INTRAVENOUS

## 2018-08-17 MED ORDER — CEFAZOLIN SODIUM-DEXTROSE 2-4 GM/100ML-% IV SOLN
INTRAVENOUS | Status: AC
  Filled 2018-08-17: qty 100

## 2018-08-17 MED ORDER — GABAPENTIN 300 MG PO CAPS
300.0000 mg | ORAL_CAPSULE | ORAL | Status: AC
  Administered 2018-08-17: 300 mg via ORAL

## 2018-08-17 MED ORDER — SODIUM CHLORIDE 0.9 % IV SOLN
INTRAVENOUS | Status: DC | PRN
  Administered 2018-08-17: 25 ug/min via INTRAVENOUS

## 2018-08-17 MED ORDER — CELECOXIB 200 MG PO CAPS
ORAL_CAPSULE | ORAL | Status: AC
  Administered 2018-08-17: 200 mg via ORAL
  Filled 2018-08-17: qty 1

## 2018-08-17 MED ORDER — LACTATED RINGERS IV SOLN
INTRAVENOUS | Status: DC
  Administered 2018-08-17: 11:00:00 via INTRAVENOUS

## 2018-08-17 MED ORDER — ONDANSETRON HCL 4 MG/2ML IJ SOLN
INTRAMUSCULAR | Status: AC
  Filled 2018-08-17: qty 2

## 2018-08-17 SURGICAL SUPPLY — 45 items
ADH SKN CLS APL DERMABOND .7 (GAUZE/BANDAGES/DRESSINGS) ×1
APPLIER CLIP ROT 10 11.4 M/L (STAPLE) ×3
APR CLP MED LRG 11.4X10 (STAPLE) ×1
BAG SPEC RTRVL 10 TROC 200 (ENDOMECHANICALS) ×1
BLADE CLIPPER SURG (BLADE) IMPLANT
CANISTER SUCT 3000ML PPV (MISCELLANEOUS) ×3 IMPLANT
CHLORAPREP W/TINT 26ML (MISCELLANEOUS) ×3 IMPLANT
CLIP APPLIE ROT 10 11.4 M/L (STAPLE) ×1 IMPLANT
COVER MAYO STAND STRL (DRAPES) ×2 IMPLANT
COVER SURGICAL LIGHT HANDLE (MISCELLANEOUS) ×3 IMPLANT
COVER WAND RF STERILE (DRAPES) ×3 IMPLANT
DERMABOND ADVANCED (GAUZE/BANDAGES/DRESSINGS) ×2
DERMABOND ADVANCED .7 DNX12 (GAUZE/BANDAGES/DRESSINGS) ×1 IMPLANT
DRAPE C-ARM 42X72 X-RAY (DRAPES) ×2 IMPLANT
DRAPE WARM FLUID 44X44 (DRAPE) ×3 IMPLANT
ELECT REM PT RETURN 9FT ADLT (ELECTROSURGICAL) ×3
ELECTRODE REM PT RTRN 9FT ADLT (ELECTROSURGICAL) ×1 IMPLANT
GLOVE BIO SURGEON STRL SZ8 (GLOVE) ×3 IMPLANT
GLOVE BIOGEL PI IND STRL 8 (GLOVE) ×1 IMPLANT
GLOVE BIOGEL PI INDICATOR 8 (GLOVE) ×2
GOWN STRL REUS W/ TWL LRG LVL3 (GOWN DISPOSABLE) ×2 IMPLANT
GOWN STRL REUS W/ TWL XL LVL3 (GOWN DISPOSABLE) ×1 IMPLANT
GOWN STRL REUS W/TWL LRG LVL3 (GOWN DISPOSABLE) ×6
GOWN STRL REUS W/TWL XL LVL3 (GOWN DISPOSABLE) ×3
KIT BASIN OR (CUSTOM PROCEDURE TRAY) ×3 IMPLANT
KIT TURNOVER KIT B (KITS) ×3 IMPLANT
NS IRRIG 1000ML POUR BTL (IV SOLUTION) ×3 IMPLANT
PAD ARMBOARD 7.5X6 YLW CONV (MISCELLANEOUS) ×3 IMPLANT
POUCH RETRIEVAL ECOSAC 10 (ENDOMECHANICALS) ×1 IMPLANT
POUCH RETRIEVAL ECOSAC 10MM (ENDOMECHANICALS) ×2
SCISSORS LAP 5X35 DISP (ENDOMECHANICALS) ×3 IMPLANT
SET CHOLANGIOGRAPH 5 50 .035 (SET/KITS/TRAYS/PACK) ×2 IMPLANT
SET IRRIG TUBING LAPAROSCOPIC (IRRIGATION / IRRIGATOR) ×3 IMPLANT
SET TUBE SMOKE EVAC HIGH FLOW (TUBING) ×3 IMPLANT
SLEEVE ENDOPATH XCEL 5M (ENDOMECHANICALS) ×3 IMPLANT
SPECIMEN JAR SMALL (MISCELLANEOUS) ×3 IMPLANT
SUT MNCRL AB 4-0 PS2 18 (SUTURE) ×3 IMPLANT
SUT VICRYL 0 UR6 27IN ABS (SUTURE) ×4 IMPLANT
TOWEL OR 17X24 6PK STRL BLUE (TOWEL DISPOSABLE) ×3 IMPLANT
TOWEL OR 17X26 10 PK STRL BLUE (TOWEL DISPOSABLE) ×3 IMPLANT
TRAY LAPAROSCOPIC MC (CUSTOM PROCEDURE TRAY) ×3 IMPLANT
TROCAR XCEL BLUNT TIP 100MML (ENDOMECHANICALS) ×3 IMPLANT
TROCAR XCEL NON-BLD 11X100MML (ENDOMECHANICALS) ×3 IMPLANT
TROCAR XCEL NON-BLD 5MMX100MML (ENDOMECHANICALS) ×3 IMPLANT
WATER STERILE IRR 1000ML POUR (IV SOLUTION) ×3 IMPLANT

## 2018-08-17 NOTE — Transfer of Care (Signed)
Immediate Anesthesia Transfer of Care Note  Patient: Julie Petty DOBOSZ  Procedure(s) Performed: Laparoscopic Cholecystectomy (N/A Abdomen)  Patient Location: PACU  Anesthesia Type:General  Level of Consciousness: awake, alert  and oriented  Airway & Oxygen Therapy: Patient Spontanous Breathing and Patient connected to nasal cannula oxygen  Post-op Assessment: Report given to RN, Post -op Vital signs reviewed and stable and Patient moving all extremities X 4  Post vital signs: Reviewed and stable  Last Vitals:  Vitals Value Taken Time  BP 134/57 08/17/2018  2:19 PM  Temp    Pulse 57 08/17/2018  2:26 PM  Resp 13 08/17/2018  2:26 PM  SpO2 96 % 08/17/2018  2:26 PM  Vitals shown include unvalidated device data.  Last Pain:  Vitals:   08/17/18 1050  TempSrc:   PainSc: 0-No pain         Complications: No apparent anesthesia complications

## 2018-08-17 NOTE — Op Note (Signed)
Laparoscopic Cholecystectomy Procedure Note  Indications: This patient presents with symptomatic gallbladder disease and will undergo laparoscopic cholecystectomy. The procedure has been discussed with the patient. Operative and non operative treatments have been discussed. Risks of surgery include bleeding, infection,  Common bile duct injury,  Injury to the stomach,liver, colon,small intestine, abdominal wall,  Diaphragm,  Major blood vessels,  And the need for an open procedure.  Other risks include worsening of medical problems, death,  DVT and pulmonary embolism, and cardiovascular events.   Medical options have also been discussed. The patient has been informed of long term expectations of surgery and non surgical options,  The patient agrees to proceed.    Pre-operative Diagnosis: Chronic cholecystitis  Post-operative Diagnosis: Same  Surgeon: Joyice Faster Jaleigh Mccroskey   Assistants: Smails RNFA   Anesthesia: General endotracheal anesthesia and Local anesthesia 0.25.% bupivacaine  ASA Class: 2  Procedure Details  The patient was seen again in the Holding Room. The risks, benefits, complications, treatment options, and expected outcomes were discussed with the patient. The possibilities of reaction to medication, pulmonary aspiration, perforation of viscus, bleeding, recurrent infection, finding a normal gallbladder, the need for additional procedures, failure to diagnose a condition, the possible need to convert to an open procedure, and creating a complication requiring transfusion or operation were discussed with the patient. The patient and/or family concurred with the proposed plan, giving informed consent. The site of surgery properly noted/marked. The patient was taken to Operating Room, identified as Julie Petty and the procedure verified as Laparoscopic Cholecystectomy with Intraoperative Cholangiograms. A Time Out was held and the above information confirmed.  Prior to the induction of  general anesthesia, antibiotic prophylaxis was administered. General endotracheal anesthesia was then administered and tolerated well. After the induction, the abdomen was prepped in the usual sterile fashion. The patient was positioned in the supine position with the left arm comfortably tucked, along with some reverse Trendelenburg.  Local anesthetic agent was injected into the skin near the umbilicus and an incision made. The midline fascia was incised and the Hasson technique was used to introduce a 12 mm port under direct vision. It was secured with a figure of eight Vicryl suture placed in the usual fashion. Pneumoperitoneum was then created with CO2 and tolerated well without any adverse changes in the patient's vital signs. Additional trocars were introduced under direct vision with an 11 mm trocar in the epigastrium and 2 5 mm trocars in the right upper quadrant. All skin incisions were infiltrated with a local anesthetic agent before making the incision and placing the trocars.   The gallbladder was identified, the fundus grasped and retracted cephalad. Adhesions were lysed bluntly and with the electrocautery where indicated, taking care not to injure any adjacent organs or viscus. The infundibulum was grasped and retracted laterally, exposing the peritoneum overlying the triangle of Calot. This was then divided and exposed in a blunt fashion. The cystic duct was clearly identified and bluntly dissected circumferentially. The junctions of the gallbladder, cystic duct and common bile duct were clearly identified prior to the division of any linear structure.   An incision was made in the cystic duct and the cholangiogram catheter introduced. The catheter would not feed easily after multiple attempts. The catheter was removed,  The critical view was obtained.     The cystic duct was then  ligated with surgical clips  on the patient side and  clipped on the gallbladder side and divided. The cystic  artery was identified, dissected  free, ligated with clips and divided as well. Posterior cystic artery clipped and divided.  The gallbladder was dissected from the liver bed in retrograde fashion with the electrocautery. The gallbladder was removed. The liver bed was irrigated and inspected. Hemostasis was achieved with the electrocautery. Copious irrigation was utilized and was repeatedly aspirated until clear all particulate matter. Hemostasis was achieved with no signs  Of bleeding or bile leakage.  Pneumoperitoneum was completely reduced after viewing removal of the trocars under direct vision. The wound was thoroughly irrigated and the fascia was then closed with a figure of eight suture; the skin was then closed with 4 O monocryl  and a sterile dressing was applied.  Instrument, sponge, and needle counts were correct at closure and at the conclusion of the case.   Findings: Cholecystitis with Cholelithiasis  Estimated Blood Loss: Minimal         Drains: none         Total IV Fluids: per OR record          Specimens: Gallbladder           Complications: None; patient tolerated the procedure well.         Disposition: PACU - hemodynamically stable.         Condition: stable

## 2018-08-17 NOTE — Discharge Instructions (Addendum)
[facility] ______[facility] °LAPAROSCOPIC SURGERY: POST OP INSTRUCTIONS °Always review your discharge instruction sheet given to you by the facility where your surgery was performed. °IF YOU HAVE DISABILITY OR FAMILY LEAVE FORMS, YOU MUST BRING THEM TO THE OFFICE FOR PROCESSING.   °DO NOT GIVE THEM TO YOUR DOCTOR. ° °1. A prescription for pain medication may be given to you upon discharge.  Take your pain medication as prescribed, if needed.  If narcotic pain medicine is not needed, then you may take acetaminophen (Tylenol) or ibuprofen (Advil) as needed. °2. Take your usually prescribed medications unless otherwise directed. °3. If you need a refill on your pain medication, please contact your pharmacy.  They will contact our office to request authorization. Prescriptions will not be filled after 5pm or on week-ends. °4. You should follow a light diet the first few days after arrival home, such as soup and crackers, etc.  Be sure to include lots of fluids daily. °5. Most patients will experience some swelling and bruising in the area of the incisions.  Ice packs will help.  Swelling and bruising can take several days to resolve.  °6. It is common to experience some constipation if taking pain medication after surgery.  Increasing fluid intake and taking a stool softener (such as Colace) will usually help or prevent this problem from occurring.  A mild laxative (Milk of Magnesia or Miralax) should be taken according to package instructions if there are no bowel movements after 48 hours. °7. Unless discharge instructions indicate otherwise, you may remove your bandages 24-48 hours after surgery, and you may shower at that time.  You may have steri-strips (small skin tapes) in place directly over the incision.  These strips should be left on the skin for 7-10 days.  If your surgeon used skin glue on the incision, you may shower in 24 hours.  The glue will flake off over the next 2-3 weeks.  Any sutures or  staples will be removed at the office during your follow-up visit. °8. ACTIVITIES:  You may resume regular (light) daily activities beginning the next day--such as daily self-care, walking, climbing stairs--gradually increasing activities as tolerated.  You may have sexual intercourse when it is comfortable.  Refrain from any heavy lifting or straining until approved by your doctor. °a. You may drive when you are no longer taking prescription pain medication, you can comfortably wear a seatbelt, and you can safely maneuver your car and apply brakes. °b. RETURN TO WORK:  __________________________________________________________ °9. You should see your doctor in the office for a follow-up appointment approximately 2-3 weeks after your surgery.  Make sure that you call for this appointment within a day or two after you arrive home to insure a convenient appointment time. °10. OTHER INSTRUCTIONS: __________________________________________________________________________________________________________________________ __________________________________________________________________________________________________________________________ °WHEN TO CALL YOUR DOCTOR: °1. Fever over 101.0 °2. Inability to urinate °3. Continued bleeding from incision. °4. Increased pain, redness, or drainage from the incision. °5. Increasing abdominal pain ° °The clinic staff is available to answer your questions during regular business hours.  Please don’t hesitate to call and ask to speak to one of the nurses for clinical concerns.  If you have a medical emergency, go to the nearest emergency room or call 911.  A surgeon from [facility] is always on call at the hospital. °[address], [address], [place], [place]  [address] ? [address] [address], [place], [place]   [address] °[phone] ? [phone] ? FAX [phone] °Web site:   [email or web address] °

## 2018-08-17 NOTE — Anesthesia Procedure Notes (Signed)
Procedure Name: Intubation Date/Time: 08/17/2018 12:44 PM Performed by: Gaylene Brooks, CRNA Pre-anesthesia Checklist: Patient identified, Emergency Drugs available, Suction available and Patient being monitored Patient Re-evaluated:Patient Re-evaluated prior to induction Oxygen Delivery Method: Circle System Utilized Preoxygenation: Pre-oxygenation with 100% oxygen Induction Type: IV induction Ventilation: Mask ventilation without difficulty Laryngoscope Size: Miller and 2 Grade View: Grade II Tube type: Oral Tube size: 7.0 mm Number of attempts: 1 Airway Equipment and Method: Stylet and Oral airway Placement Confirmation: ETT inserted through vocal cords under direct vision,  positive ETCO2 and breath sounds checked- equal and bilateral Secured at: 21 cm Tube secured with: Tape Dental Injury: Teeth and Oropharynx as per pre-operative assessment

## 2018-08-17 NOTE — Interval H&P Note (Signed)
History and Physical Interval Note:  08/17/2018 12:08 PM  Julie Petty  has presented today for surgery, with the diagnosis of biliary dyskinesia  The various methods of treatment have been discussed with the patient and family. After consideration of risks, benefits and other options for treatment, the patient has consented to  Procedure(s): LAPAROSCOPIC CHOLECYSTECTOMY (N/A) as a surgical intervention .  The patient's history has been reviewed, patient examined, no change in status, stable for surgery.  I have reviewed the patient's chart and labs.  Questions were answered to the patient's satisfaction.     [place]

## 2018-08-17 NOTE — Anesthesia Postprocedure Evaluation (Signed)
Anesthesia Post Note  Patient: Julie Petty  Procedure(s) Performed: Laparoscopic Cholecystectomy (N/A Abdomen)     Patient location during evaluation: PACU Anesthesia Type: General Level of consciousness: awake and alert Pain management: pain level controlled Vital Signs Assessment: post-procedure vital signs reviewed and stable Respiratory status: spontaneous breathing, nonlabored ventilation, respiratory function stable and patient connected to nasal cannula oxygen Cardiovascular status: blood pressure returned to baseline and stable Postop Assessment: no apparent nausea or vomiting Anesthetic complications: no    Last Vitals:  Vitals:   08/17/18 1450 08/17/18 1500  BP: 121/60   Pulse: (!) 52 (!) 54  Resp: 16 14  Temp:  36.5 C  SpO2: 95% 95%               [name]

## 2018-08-18 ENCOUNTER — Encounter (HOSPITAL_COMMUNITY): Payer: Self-pay | Admitting: Surgery

## 2019-01-07 ENCOUNTER — Other Ambulatory Visit: Payer: Self-pay

## 2019-01-07 ENCOUNTER — Encounter (INDEPENDENT_AMBULATORY_CARE_PROVIDER_SITE_OTHER): Payer: Self-pay

## 2019-01-07 ENCOUNTER — Encounter: Payer: Self-pay | Admitting: Physical Therapy

## 2019-01-07 ENCOUNTER — Ambulatory Visit: Payer: Medicare Other | Attending: Surgery | Admitting: Physical Therapy

## 2019-01-07 DIAGNOSIS — R278 Other lack of coordination: Secondary | ICD-10-CM | POA: Insufficient documentation

## 2019-01-07 DIAGNOSIS — M6281 Muscle weakness (generalized): Secondary | ICD-10-CM | POA: Insufficient documentation

## 2019-01-07 DIAGNOSIS — R1084 Generalized abdominal pain: Secondary | ICD-10-CM | POA: Diagnosis present

## 2019-01-07 NOTE — Therapy (Addendum)
[facility] [address], [place], [place], [address] Phone: [phone]   Fax:  [phone]  Physical Therapy Evaluation  Patient Details  Name: Julie Petty MRN: [id number] Date of Birth: 1932/06/18 Referring Provider (PT): Dr. Erroll Luna   Encounter Date: 01/07/2019  Visit number: 1 Date re-eval: 02/04/2019 PT start time: 9:30 PT stop time: 10:05 PT time calculation: 35 min Activity Tolerance: Patient tolerated treatment well Behavior During Therapy: [facility] for tasks assessed/Performed Earlie Counts, PT 01/25/19 12:07 PM    Past Medical History:  Diagnosis Date  . Complication of anesthesia   . Essential hypertension   . GERD (gastroesophageal reflux disease)   . Glaucoma   . Hernia   . Hyperlipemia   . Myocardial infarction ([place])    NSTEMI 04/24/18 (40% D2 by LHC; Dx: Takotsubo CM vs coronary spasm vs myocarditis)  . PONV (postoperative nausea and vomiting)     Past Surgical History:  Procedure Laterality Date  . CARDIAC CATHETERIZATION  04/05/03  . CHOLECYSTECTOMY N/A 08/17/2018   Procedure: Laparoscopic Cholecystectomy;  Surgeon: Erroll Luna, MD;  Location: [facility];  Service: General;  Laterality: N/A;  . KNEE ARTHROSCOPY  09/17/05   left  . LEFT HEART CATH AND CORONARY ANGIOGRAPHY N/A 04/26/2018   Procedure: LEFT HEART CATH AND CORONARY ANGIOGRAPHY;  Surgeon: Leonie Man, MD;  Location: [facility];  Service: Cardiovascular;  Laterality: N/A;  . VAGINAL HYSTERECTOMY  1980's    There were no vitals filed for this visit.   Subjective Assessment - 01/07/19 0941    Subjective  Patient had gall bladder surgery 08/19/2018 and has had pain since then. stomach feels heavy at times.    Patient Stated Goals  reduce pain    Currently in Pain?  Yes    Pain Score  4    can be 10   Pain Location  Abdomen    Pain Orientation  Anterior    Pain Descriptors / Indicators  Burning;Stabbing     Pain Type  Chronic pain    Pain Onset  More than a month ago    Pain Frequency  Intermittent    Aggravating Factors   tugging, bending over, stand at counter, touching, sweeping, carrying groceries    Pain Relieving Factors  rubbing her belly         [facility] PT Assessment - 01/07/19 0001      Assessment   Medical Diagnosis  S39.011A Abdominal Wall Strain    Referring Provider (PT)  Dr. Marcello Moores [place]    Onset Date/Surgical Date  08/13/18    Prior Therapy  none      Precautions   Precautions  None      Restrictions   Weight Bearing Restrictions  No      Balance Screen   Has the patient fallen in the past 6 months  No    Has the patient had a decrease in activity level because of a fear of falling?   No    Is the patient reluctant to leave their home because of a fear of falling?   No      Home Film/video editor residence      Prior Function   Level of Independence  Independent    Vocation  Retired    Leisure  taking care of her home and flower beds      Cognition   Overall Cognitive Status  Within Functional Limits for  tasks assessed      Posture/Postural Control   Posture/Postural Control  Postural limitations    Postural Limitations  Rounded Shoulders;Forward head      ROM / Strength   AROM / PROM / Strength  Strength      Strength   Overall Strength Comments  abdominal strength 1/5      Palpation   Spinal mobility  decreased movement of the lower rib cage, increased  contraction of the upper abdominals make the lower abdominals bulge out,     Palpation comment  tenderness located on the medial upper aspect of left ilium, tightness in the upper abdomen and diaphragm, tenderness located superior to umbilicus,                 Objective measurements completed on examination: See above findings.              PT Education - 01/07/19 1022    Education Details  Access Code: [email or web address]; Abdominal massage    Person(s) Educated   Patient    Methods  Explanation;Demonstration;Verbal cues;Handout    Comprehension  Returned demonstration;Verbalized understanding       PT Short Term Goals - 01/07/19 1036      PT SHORT TERM GOAL #1   Title  independent with initial HEP    Time  4    Period  Weeks    Status  New    Target Date  02/04/19      PT SHORT TERM GOAL #2   Title  able to contract the upper abdominals and lower abdominals equally    Time  4    Period  Weeks    Status  New    Target Date  02/04/19      PT SHORT TERM GOAL #3   Title  abdominal pain decreased >/= 25% with activity    Time  4    Period  Weeks    Status  New    Target Date  02/04/19        PT Long Term Goals - 01/07/19 1041      PT LONG TERM GOAL #1   Title  independent with HEP and understand how to progress correctly    Time  8    Period  Weeks    Status  New    Target Date  02/04/19      PT LONG TERM GOAL #2   Title  able to contract abdominals correctlywith lifting, carrying groceries, and transfers due to increased strength 3/5    Time  8    Period  Weeks    Status  New    Target Date  03/04/19      PT LONG TERM GOAL #3   Title  abdominal pain pain with activities is minimal to no pain due to reduction of trigger points and increased strength    Time  8    Period  Weeks    Status  New    Target Date  03/04/19             Plan - 01/07/19 1022    Clinical Impression Statement  Patient is a 85 year old female with abdominal pain with activity since she had her Gallbladder removed on 08/13/2018. Patient reports her pain ranges from 4/10-10/10 with lifting, bending over, housework and gardening. Patient has tenderness located in the left medial ilium, superior umbilicus. Patient has tightness in the diaphragm, lower abdomen and around the umbilicus. When patient  contracts her abdominals the upper over contract and the lower will bulge out. Patient has difficulty contracting the lower abdominal without the upper  abdominals. Patient has decreased lower rib mobility. Patient abdominal strength is 1/5. Patient will benefit from skilled therapy to improve abdominal coordination with contraction so she is able to brace herself with activity.    Personal Factors and Comorbidities  Age;Comorbidity 1;Comorbidity 2;Comorbidity 3+    Comorbidities  s/p Gall bladder removal on 08/13/2018, Myocardial infarction, Glaucoma    Examination-Activity Limitations  Lift;Carry;Bend    Examination-Participation Restrictions  Yard Work    Stability/Clinical Decision Making  Evolving/Moderate complexity    Clinical Decision Making  Moderate    Rehab Potential  Excellent    PT Frequency  2x / week    PT Duration  8 weeks    PT Treatment/Interventions  Moist Heat;Cryotherapy;Electrical Stimulation;Therapeutic activities;Therapeutic exercise;Patient/family education;Neuromuscular re-education;Manual techniques;Dry needling;Taping    PT Next Visit Plan  work on contracting the lower abdominals and decreased contraction of the upper abdominals, soft tissue work to the upper abdomen, core strength, lifting with correct body mechanics    PT Home Exercise Plan  Access Code: [email or web address] and Agree with Plan of Care  Patient       Patient will benefit from skilled therapeutic intervention in order to improve the following deficits and impairments:  Decreased coordination, Increased fascial restricitons, Increased muscle spasms, Decreased activity tolerance, Pain, Decreased scar mobility, Decreased mobility, Decreased strength  Visit Diagnosis: 1. Muscle weakness (generalized)   2. Other lack of coordination   3. Generalized abdominal pain        Problem List Patient Active Problem List   Diagnosis Date Noted  . Coronary artery disease involving native coronary artery of native heart without angina pectoris 08/14/2018  . Cardiomyopathy ([place])   . NSTEMI (non-ST elevated myocardial infarction) ([place]) 04/24/2018  .  Essential hypertension 04/24/2018  . Hyperlipidemia with target LDL less than 70 04/24/2018  . Inguinal hernia 01/14/2011    Earlie Counts, PT 01/07/19 10:56 AM   [facility] [address] [place], [place], [address] Phone: [phone]   Fax:  [phone]  Name: Julie Petty MRN: [id number] Date of Birth: 03/31/1932

## 2019-01-07 NOTE — Patient Instructions (Addendum)
About Abdominal Massage  Abdominal massage, also called external colon massage, is a self-treatment circular massage technique that can reduce and eliminate gas and ease constipation. The colon naturally contracts in waves in a clockwise direction starting from inside the right hip, moving up toward the ribs, across the belly, and down inside the left hip.  When you perform circular abdominal massage, you help stimulate your colon's normal wave pattern of movement called peristalsis.  It is most beneficial when done after eating.  Positioning You can practice abdominal massage with oil while lying down, or in the shower with soap.  Some people find that it is just as effective to do the massage through clothing while sitting or standing.  How to Massage Start by placing your finger tips or knuckles on your right side, just inside your hip bone.  . Make small circular movements while you move upward toward your rib cage.   . Once you reach the bottom right side of your rib cage, take your circular movements across to the left side of the bottom of your rib cage.  . Next, move downward until you reach the inside of your left hip bone.  This is the path your feces travel in your colon. . Continue to perform your abdominal massage in this pattern for 10 minutes each day.     You can apply as much pressure as is comfortable in your massage.  Start gently and build pressure as you continue to practice.  Notice any areas of pain as you massage; areas of slight pain may be relieved as you massage, but if you have areas of significant or intense pain, consult with your healthcare provider.  Other Considerations . General physical activity including bending and stretching can have a beneficial massage-like effect on the colon.  Deep breathing can also stimulate the colon because breathing deeply activates the same nervous system that supplies the colon.   . Abdominal massage should always be used in  combination with a bowel-conscious diet that is high in the proper type of fiber for you, fluids (primarily water), and a regular exercise program. Access Code: [place]: [email or web address]  Date: 01/07/2019  Prepared by: Earlie Counts   Exercises Hooklying Isometric Hip Flexion - 5 reps - 1 sets - 5 sec hold - 2x daily - 7x weekly [place] [address] [place], [place] [address] Phone # [phone] Fax [phone]

## 2019-01-12 ENCOUNTER — Ambulatory Visit: Payer: Medicare Other | Admitting: Physical Therapy

## 2019-01-12 ENCOUNTER — Encounter: Payer: Self-pay | Admitting: Physical Therapy

## 2019-01-12 ENCOUNTER — Other Ambulatory Visit: Payer: Self-pay

## 2019-01-12 DIAGNOSIS — M6281 Muscle weakness (generalized): Secondary | ICD-10-CM | POA: Diagnosis not present

## 2019-01-12 DIAGNOSIS — R278 Other lack of coordination: Secondary | ICD-10-CM

## 2019-01-12 DIAGNOSIS — R1084 Generalized abdominal pain: Secondary | ICD-10-CM

## 2019-01-12 NOTE — Therapy (Signed)
[facility] [address], [place], [place], [address] Phone: [phone]   Fax:  [phone]  Physical Therapy Treatment  Patient Details  Name: Julie Petty MRN: [id number] Date of Birth: 1932-05-17 Referring Provider (PT): Dr. Erroll Luna   Encounter Date: 01/12/2019  PT End of Session - 01/12/19 1005    Visit Number  2    PT Start Time  6237    PT Stop Time  1045    PT Time Calculation (min)  43 min    Activity Tolerance  Patient tolerated treatment well    Behavior During Therapy  [facility] for tasks assessed/performed       Past Medical History:  Diagnosis Date  . Complication of anesthesia   . Essential hypertension   . GERD (gastroesophageal reflux disease)   . Glaucoma   . Hernia   . Hyperlipemia   . Myocardial infarction ([place])    NSTEMI 04/24/18 (40% D2 by LHC; Dx: Takotsubo CM vs coronary spasm vs myocarditis)  . PONV (postoperative nausea and vomiting)     Past Surgical History:  Procedure Laterality Date  . CARDIAC CATHETERIZATION  04/05/03  . CHOLECYSTECTOMY N/A 08/17/2018   Procedure: Laparoscopic Cholecystectomy;  Surgeon: Erroll Luna, MD;  Location: [place];  Service: General;  Laterality: N/A;  . KNEE ARTHROSCOPY  09/17/05   left  . LEFT HEART CATH AND CORONARY ANGIOGRAPHY N/A 04/26/2018   Procedure: LEFT HEART CATH AND CORONARY ANGIOGRAPHY;  Surgeon: Leonie Man, MD;  Location: [place] [facility];  Service: Cardiovascular;  Laterality: N/A;  . VAGINAL HYSTERECTOMY  1980's    There were no vitals filed for this visit.                    [place] Adult PT Treatment/Exercise - 01/12/19 0001      Lumbar Exercises: Aerobic   Stationary Bike  L1 x 5 min with 10 sec intervals of lower abdominal contractions. 20 sec rest      Lumbar Exercises: Supine   Ab Set  5 reps;3 seconds   TC to lower ab for facilitation: VC for exhale out mouth   AB Set Limitations  Ball squeeze  with TA contraction; TC to lower abs   VC for breathing: exhale out mouth for TA: 2x5   Isometric Hip Flexion  5 reps;5 seconds   Bil LE   Isometric Hip Flexion Limitations  Added exhale to contraction             PT Education - 01/12/19 1051    Education Details  TA contraction with hands on lower abs, TA contraction with ball squeeze for HEP    Person(s) Educated  Patient    Methods  Explanation;Demonstration;Tactile cues;Verbal cues;Handout    Comprehension  Returned demonstration;Verbalized understanding       PT Short Term Goals - 01/07/19 1036      PT SHORT TERM GOAL #1   Title  independent with initial HEP    Time  4    Period  Weeks    Status  New    Target Date  02/04/19      PT SHORT TERM GOAL #2   Title  able to contract the upper abdominals and lower abdominals equally    Time  4    Period  Weeks    Status  New    Target Date  02/04/19      PT SHORT TERM GOAL #3  Title  abdominal pain decreased >/= 25% with activity    Time  4    Period  Weeks    Status  New    Target Date  02/04/19        PT Long Term Goals - 01/07/19 1041      PT LONG TERM GOAL #1   Title  independent with HEP and understand how to progress correctly    Time  8    Period  Weeks    Status  New    Target Date  02/04/19      PT LONG TERM GOAL #2   Title  able to contract abdominals correctlywith lifting, carrying groceries, and transfers due to increased strength 3/5    Time  8    Period  Weeks    Status  New    Target Date  03/04/19      PT LONG TERM GOAL #3   Title  abdominal pain pain with activities is minimal to no pain due to reduction of trigger points and increased strength    Time  8    Period  Weeks    Status  New    Target Date  03/04/19            Plan - 01/12/19 1005    Clinical Impression Statement  Pt independent in her initail HEP which she feels is already helping decrease her pain. PTA added an emphasis on an audible exhale to aide in  contraction of her lower abdominals. Pt was successful in contracting the lower abs without bulging, usingher hands for a tactile cue. She was also able to add an adductor squeeze to her TA for further faciliatation of her pelvic floor muscles. Added to HEP today for progression, no pain with exercises. Pt also rides her stationary bike 5 min a day. She was instructed how to incorporate her abdominal contractions intermittently while she rides. Pt could properly demonstrate and will add this to her bike routine at home.    Personal Factors and Comorbidities  Age;Comorbidity 1;Comorbidity 2;Comorbidity 3+    Comorbidities  s/p Gall bladder removal on 08/13/2018, Myocardial infarction, Glaucoma    Examination-Activity Limitations  Lift;Carry;Bend    Examination-Participation Restrictions  Yard Work    Stability/Clinical Decision Making  Evolving/Moderate complexity    Rehab Potential  Excellent    PT Frequency  2x / week    PT Duration  8 weeks    PT Treatment/Interventions  Moist Heat;Cryotherapy;Electrical Stimulation;Therapeutic activities;Therapeutic exercise;Patient/family education;Neuromuscular re-education;Manual techniques;Dry needling;Taping    PT Next Visit Plan  work on contracting the lower abdominals and decreased contraction of the upper abdominals, soft tissue work to the upper abdomen, core strength, lifting with correct body mechanics    PT Home Exercise Plan  Access Code: [place] and Agree with Plan of Care  Patient       Patient will benefit from skilled therapeutic intervention in order to improve the following deficits and impairments:  Decreased coordination, Increased fascial restricitons, Increased muscle spasms, Decreased activity tolerance, Pain, Decreased scar mobility, Decreased mobility, Decreased strength  Visit Diagnosis: 1. Muscle weakness (generalized)   2. Other lack of coordination   3. Generalized abdominal pain        Problem List Patient  Active Problem List   Diagnosis Date Noted  . Coronary artery disease involving native coronary artery of native heart without angina pectoris 08/14/2018  . Cardiomyopathy ([place])   . NSTEMI (non-ST elevated myocardial  infarction) ([place]) 04/24/2018  . Essential hypertension 04/24/2018  . Hyperlipidemia with target LDL less than 70 04/24/2018  . Inguinal hernia 01/14/2011    Leauna Sharber, PTA 01/12/2019, 10:52 AM  [facility] [address] [place], [place], [address] Phone: [phone]   Fax:  [phone]  Name: Julie Petty MRN: [id number] Date of Birth: 1931/11/02

## 2019-01-14 ENCOUNTER — Encounter

## 2019-01-19 ENCOUNTER — Encounter: Payer: Self-pay | Admitting: Physical Therapy

## 2019-01-19 ENCOUNTER — Other Ambulatory Visit: Payer: Self-pay | Admitting: Physician Assistant

## 2019-01-19 DIAGNOSIS — N644 Mastodynia: Secondary | ICD-10-CM

## 2019-01-20 ENCOUNTER — Other Ambulatory Visit: Payer: Self-pay

## 2019-01-20 ENCOUNTER — Ambulatory Visit: Payer: Medicare Other | Admitting: Physical Therapy

## 2019-01-20 ENCOUNTER — Encounter: Payer: Self-pay | Admitting: Physical Therapy

## 2019-01-20 DIAGNOSIS — M6281 Muscle weakness (generalized): Secondary | ICD-10-CM | POA: Diagnosis not present

## 2019-01-20 DIAGNOSIS — R1084 Generalized abdominal pain: Secondary | ICD-10-CM

## 2019-01-20 DIAGNOSIS — R278 Other lack of coordination: Secondary | ICD-10-CM

## 2019-01-20 NOTE — Patient Instructions (Signed)

## 2019-01-20 NOTE — Therapy (Signed)
[facility] [address], [place], [place], [address] Phone: [phone]   Fax:  [phone]  Physical Therapy Treatment  Patient Details  Name: Julie Petty MRN: [id number] Date of Birth: [birth year] Referring Provider (PT): Dr. Erroll Luna   Encounter Date: 01/20/2019  PT End of Session - 01/20/19 0858    Visit Number  3    PT Start Time  0858    PT Stop Time  0939    PT Time Calculation (min)  41 min    Activity Tolerance  --    Behavior During Therapy  --       Past Medical History:  Diagnosis Date  . Complication of anesthesia   . Essential hypertension   . GERD (gastroesophageal reflux disease)   . Glaucoma   . Hernia   . Hyperlipemia   . Myocardial infarction ([place])    NSTEMI 04/24/18 (40% D2 by LHC; Dx: Takotsubo CM vs coronary spasm vs myocarditis)  . PONV (postoperative nausea and vomiting)     Past Surgical History:  Procedure Laterality Date  . CARDIAC CATHETERIZATION  04/05/03  . CHOLECYSTECTOMY N/A 08/17/2018   Procedure: Laparoscopic Cholecystectomy;  Surgeon: Erroll Luna, MD;  Location: [place];  Service: General;  Laterality: N/A;  . KNEE ARTHROSCOPY  09/17/05   left  . LEFT HEART CATH AND CORONARY ANGIOGRAPHY N/A 04/26/2018   Procedure: LEFT HEART CATH AND CORONARY ANGIOGRAPHY;  Surgeon: Leonie Man, MD;  Location: [place] [facility];  Service: Cardiovascular;  Laterality: N/A;  . VAGINAL HYSTERECTOMY  1980's    There were no vitals filed for this visit.                    [place] Adult PT Treatment/Exercise - 01/20/19 0001      Lumbar Exercises: Aerobic   Nustep  L1 x 4 min       Lumbar Exercises: Supine   AB Set Limitations  Ball squeeze with TA contraction; TC to lower abs   VC for breathing: exhale out mouth for TA: 10x   Clam  10 reps    Clam Limitations  hands to lower abs, gave for HEP    Heel Slides  10 reps    Heel Slides Limitations  BIL,  added to HEP    Bent Knee Raise  5 reps;1 second    Bent Knee Raise Limitations  Added to HEP             PT Education - 01/20/19 0916    Education Details  TA with leg movements added to HEP    Person(s) Educated  Patient    Methods  Explanation;Demonstration;Tactile cues;Verbal cues;Handout    Comprehension  Verbalized understanding;Returned demonstration       PT Short Term Goals - 01/20/19 0904      PT SHORT TERM GOAL #1   Title  independent with initial HEP    Time  4    Period  Weeks    Status  Achieved      PT SHORT TERM GOAL #2   Title  able to contract the upper abdominals and lower abdominals equally    Period  Weeks    Status  On-going   Not equally but improving     PT SHORT TERM GOAL #3   Title  abdominal pain decreased >/= 25% with activity    Time  4    Period  Weeks    Status  Achieved   40%       PT Long Term Goals - 01/07/19 1041      PT LONG TERM GOAL #1   Title  independent with HEP and understand how to progress correctly    Time  8    Period  Weeks    Status  New    Target Date  02/04/19      PT LONG TERM GOAL #2   Title  able to contract abdominals correctlywith lifting, carrying groceries, and transfers due to increased strength 3/5    Time  8    Period  Weeks    Status  New    Target Date  03/04/19      PT LONG TERM GOAL #3   Title  abdominal pain pain with activities is minimal to no pain due to reduction of trigger points and increased strength    Time  8    Period  Weeks    Status  New    Target Date  03/04/19            Plan - 01/20/19 0929    Clinical Impression Statement  Pt reports she is feeling 40% less pain since initial eval. She typically feels the abdominal pain when she lifts or tugs something. PTA advised pt to contract her abdominals prio to lifting or tugging. She agreed to try. Pt i snot riding her bike at home as she feels she needs to concentrate onher mat exercises. Ability to contract lower abs  is significantly improving. Added LE movements to HEP to further challenge the abdominals.    Personal Factors and Comorbidities  Age;Comorbidity 1;Comorbidity 2;Comorbidity 3+    Comorbidities  s/p Gall bladder removal on 08/13/2018, Myocardial infarction, Glaucoma    Examination-Activity Limitations  Lift;Carry;Bend    Examination-Participation Restrictions  Yard Work    Stability/Clinical Decision Making  Evolving/Moderate complexity    Rehab Potential  Excellent    PT Frequency  2x / week    PT Duration  8 weeks    PT Treatment/Interventions  Moist Heat;Cryotherapy;Electrical Stimulation;Therapeutic activities;Therapeutic exercise;Patient/family education;Neuromuscular re-education;Manual techniques;Dry needling;Taping    PT Next Visit Plan  Review new exercises for low abdominals to see if she is doing them correctly. If she is, begin lifting with lower ab contraction focus.    PT Home Exercise Plan  Access Code: [email or web address]    Consulted and Agree with Plan of Care  Patient       Patient will benefit from skilled therapeutic intervention in order to improve the following deficits and impairments:  Decreased coordination, Increased fascial restricitons, Increased muscle spasms, Decreased activity tolerance, Pain, Decreased scar mobility, Decreased mobility, Decreased strength  Visit Diagnosis: 1. Muscle weakness (generalized)   2. Other lack of coordination   3. Generalized abdominal pain        Problem List Patient Active Problem List   Diagnosis Date Noted  . Coronary artery disease involving native coronary artery of native heart without angina pectoris 08/14/2018  . Cardiomyopathy ([place])   . NSTEMI (non-ST elevated myocardial infarction) ([place]) 04/24/2018  . Essential hypertension 04/24/2018  . Hyperlipidemia with target LDL less than 70 04/24/2018  . Inguinal hernia 01/14/2011    Julie Petty, PTA 01/20/2019, 9:39 AM  [facility] [address], [place], [place], [address] Phone: [phone]   Fax:  [phone]  Name: Julie Petty MRN: [id number] Date of Birth: 1932-02-04

## 2019-01-25 ENCOUNTER — Encounter: Payer: Self-pay | Admitting: Physical Therapy

## 2019-01-25 ENCOUNTER — Ambulatory Visit: Payer: Medicare Other | Attending: Surgery | Admitting: Physical Therapy

## 2019-01-25 ENCOUNTER — Other Ambulatory Visit: Payer: Self-pay

## 2019-01-25 DIAGNOSIS — R278 Other lack of coordination: Secondary | ICD-10-CM | POA: Diagnosis present

## 2019-01-25 DIAGNOSIS — R1084 Generalized abdominal pain: Secondary | ICD-10-CM | POA: Diagnosis present

## 2019-01-25 DIAGNOSIS — M6281 Muscle weakness (generalized): Secondary | ICD-10-CM

## 2019-01-25 NOTE — Therapy (Signed)
[facility] [address] [place], [place], [address] Phone: [phone]   Fax:  [phone]  Physical Therapy Treatment  Patient Details  Name: Julie Petty MRN: [id number] Date of Birth: [birth year] Referring Provider (PT): Dr. Erroll Luna   Encounter Date: 01/25/2019  PT End of Session - 01/25/19 1100    Visit Number  4    Date for PT Re-Evaluation  02/04/19    PT Start Time  1101    PT Stop Time  1145    PT Time Calculation (min)  44 min    Activity Tolerance  Patient tolerated treatment well    Behavior During Therapy  [facility] for tasks assessed/performed       Past Medical History:  Diagnosis Date  . Complication of anesthesia   . Essential hypertension   . GERD (gastroesophageal reflux disease)   . Glaucoma   . Hernia   . Hyperlipemia   . Myocardial infarction ([place])    NSTEMI 04/24/18 (40% D2 by LHC; Dx: Takotsubo CM vs coronary spasm vs myocarditis)  . PONV (postoperative nausea and vomiting)     Past Surgical History:  Procedure Laterality Date  . CARDIAC CATHETERIZATION  04/05/03  . CHOLECYSTECTOMY N/A 08/17/2018   Procedure: Laparoscopic Cholecystectomy;  Surgeon: Erroll Luna, MD;  Location: [place];  Service: General;  Laterality: N/A;  . KNEE ARTHROSCOPY  09/17/05   left  . LEFT HEART CATH AND CORONARY ANGIOGRAPHY N/A 04/26/2018   Procedure: LEFT HEART CATH AND CORONARY ANGIOGRAPHY;  Surgeon: Leonie Man, MD;  Location: [place] [facility];  Service: Cardiovascular;  Laterality: N/A;  . VAGINAL HYSTERECTOMY  1980's    There were no vitals filed for this visit.  Subjective Assessment - 01/25/19 1103    Subjective  I have stinging and burning in the lower abdomen. My stomach feels heavy but not as much.    Patient Stated Goals  reduce pain    Currently in Pain?  Yes    Pain Score  3     Pain Location  Abdomen    Pain Orientation  Lower    Pain Descriptors / Indicators   Burning;Stabbing    Pain Type  Chronic pain    Pain Onset  More than a month ago    Pain Frequency  Intermittent    Aggravating Factors   tugging, bending over, stand at counter, touching, sweeping, carrying groceries    Pain Relieving Factors  rubbing belly    Multiple Pain Sites  No         [facility] PT Assessment - 01/25/19 0001      Strength   Overall Strength Comments  abdominal strength 2/5                   [facility] Adult PT Treatment/Exercise - 01/25/19 0001      Therapeutic Activites    Therapeutic Activities  Lifting    ADL's  posture in standing to lift her chest due to reduction of fascial tightness in the upper abdominals    Lifting  lifting items from mat height, pulling items, squat to lift and lunge lift to reduce strain on abdominals and use her legs      Lumbar Exercises: Seated   Sit to Stand  10 reps   without hands and decreased thoracic flexion     Manual Therapy   Manual Therapy  Soft tissue mobilization;Myofascial release    Soft tissue mobilization  diaphragm, transvers  abdominus, and rectus abdominus    Myofascial Release  release around the umbilicus, along the mesenteric root mobility, lift the small intestines off the bladder,              PT Education - 01/25/19 1153    Education Details  instruction on posture and lifting    Person(s) Educated  Patient    Methods  Explanation;Demonstration    Comprehension  Verbalized understanding;Returned demonstration       PT Short Term Goals - 01/25/19 1111      PT SHORT TERM GOAL #1   Title  independent with initial HEP    Time  4    Period  Weeks    Status  Achieved      PT SHORT TERM GOAL #2   Title  able to contract the upper abdominals and lower abdominals equally    Time  4    Period  Weeks    Status  Achieved    Target Date  02/04/19      PT SHORT TERM GOAL #3   Title  abdominal pain decreased >/= 25% with activity    Time  4    Period  Weeks    Status  Achieved     Target Date  02/04/19        PT Long Term Goals - 01/07/19 1041      PT LONG TERM GOAL #1   Title  independent with HEP and understand how to progress correctly    Time  8    Period  Weeks    Status  New    Target Date  02/04/19      PT LONG TERM GOAL #2   Title  able to contract abdominals correctlywith lifting, carrying groceries, and transfers due to increased strength 3/5    Time  8    Period  Weeks    Status  New    Target Date  03/04/19      PT LONG TERM GOAL #3   Title  abdominal pain pain with activities is minimal to no pain due to reduction of trigger points and increased strength    Time  8    Period  Weeks    Status  New    Target Date  03/04/19            Plan - 01/25/19 1110    Clinical Impression Statement  Patient has learned how to go from sit to stand without hands to reduce flexion of the thoracic spine. Patient has increased in abdominal strength and is able to engage the lower abdominals correctly. Patient has less fascial tightness in the upper abdomen and diaphragm so she is able to lift her chest up and extend the thoracic for upright posture. Patient is learning how to engage her lower abdominals with lifting and using her legs instead of flexing of the trunk. Patient will benefit from skilled therapy to improve tissue mobility and strength of abdominals to improve function.    Personal Factors and Comorbidities  Age;Comorbidity 1;Comorbidity 2;Comorbidity 3+    Comorbidities  s/p Gall bladder removal on 08/13/2018, Myocardial infarction, Glaucoma    Examination-Activity Limitations  Lift;Carry;Bend    Examination-Participation Restrictions  Yard Work    Stability/Clinical Decision Making  Evolving/Moderate complexity    Rehab Potential  Excellent    PT Frequency  2x / week    PT Duration  8 weeks    PT Treatment/Interventions  Moist Heat;Cryotherapy;Electrical Stimulation;Therapeutic activities;Therapeutic  exercise;Patient/family  education;Neuromuscular re-education;Manual techniques;Dry needling;Taping    PT Next Visit Plan  abdominal soft tissue work, bilateral shoulder extension to work on trunk extensor strength, continue to work on lifting and posture    PT Home Exercise Plan  Access Code: [facility]    Recommended Other Services  MD signed initial note    Consulted and Agree with Plan of Care  Patient       Patient will benefit from skilled therapeutic intervention in order to improve the following deficits and impairments:  Decreased coordination, Increased fascial restricitons, Increased muscle spasms, Decreased activity tolerance, Pain, Decreased scar mobility, Decreased mobility, Decreased strength  Visit Diagnosis: 1. Muscle weakness (generalized)   2. Other lack of coordination   3. Generalized abdominal pain        Problem List Patient Active Problem List   Diagnosis Date Noted  . Coronary artery disease involving native coronary artery of native heart without angina pectoris 08/14/2018  . Cardiomyopathy ([place])   . NSTEMI (non-ST elevated myocardial infarction) ([place]) 04/24/2018  . Essential hypertension 04/24/2018  . Hyperlipidemia with target LDL less than 70 04/24/2018  . Inguinal hernia 01/14/2011    Earlie Counts, PT 01/25/19 11:59 AM    [facility] [address], [place], [place], [address] Phone: [phone]   Fax:  [phone]  Name: Julie Petty MRN: [id number] Date of Birth: [birth year]

## 2019-01-26 ENCOUNTER — Ambulatory Visit
Admission: RE | Admit: 2019-01-26 | Discharge: 2019-01-26 | Disposition: A | Discharge status: Home / Self Care | Payer: Medicare Other | Source: Ambulatory Visit | Attending: Physician Assistant | Admitting: Physician Assistant

## 2019-01-26 ENCOUNTER — Ambulatory Visit: Payer: Medicare Other

## 2019-01-26 DIAGNOSIS — N644 Mastodynia: Secondary | ICD-10-CM

## 2019-01-28 ENCOUNTER — Other Ambulatory Visit: Payer: Self-pay

## 2019-01-28 ENCOUNTER — Encounter: Payer: Self-pay | Admitting: Physical Therapy

## 2019-01-28 ENCOUNTER — Ambulatory Visit: Payer: Medicare Other | Admitting: Physical Therapy

## 2019-01-28 DIAGNOSIS — R1084 Generalized abdominal pain: Secondary | ICD-10-CM

## 2019-01-28 DIAGNOSIS — M6281 Muscle weakness (generalized): Secondary | ICD-10-CM

## 2019-01-28 DIAGNOSIS — R278 Other lack of coordination: Secondary | ICD-10-CM

## 2019-01-28 NOTE — Therapy (Signed)
[facility] [address], [place], [place], [address] Phone: [phone]   Fax:  [phone]  Physical Therapy Treatment  Patient Details  Name: Julie Petty MRN: [id number] Date of Birth: 1932/03/28 Referring Provider (PT): Dr. Erroll Luna   Encounter Date: 01/28/2019  PT End of Session - 01/28/19 0934    Visit Number  5    Date for PT Re-Evaluation  02/04/19    PT Start Time  0933    PT Stop Time  1025    PT Time Calculation (min)  52 min    Activity Tolerance  Patient tolerated treatment well    Behavior During Therapy  [facility] for tasks assessed/performed       Past Medical History:  Diagnosis Date  . Complication of anesthesia   . Essential hypertension   . GERD (gastroesophageal reflux disease)   . Glaucoma   . Hernia   . Hyperlipemia   . Myocardial infarction ([place])    NSTEMI 04/24/18 (40% D2 by LHC; Dx: Takotsubo CM vs coronary spasm vs myocarditis)  . PONV (postoperative nausea and vomiting)     Past Surgical History:  Procedure Laterality Date  . CARDIAC CATHETERIZATION  04/05/03  . CHOLECYSTECTOMY N/A 08/17/2018   Procedure: Laparoscopic Cholecystectomy;  Surgeon: Erroll Luna, MD;  Location: [place];  Service: General;  Laterality: N/A;  . KNEE ARTHROSCOPY  09/17/05   left  . LEFT HEART CATH AND CORONARY ANGIOGRAPHY N/A 04/26/2018   Procedure: LEFT HEART CATH AND CORONARY ANGIOGRAPHY;  Surgeon: Leonie Man, MD;  Location: [place] [facility];  Service: Cardiovascular;  Laterality: N/A;  . VAGINAL HYSTERECTOMY  1980's    There were no vitals filed for this visit.  Subjective Assessment - 01/28/19 0934    Subjective  My family says I am standing taller.    Currently in Pain?  No/denies    Multiple Pain Sites  No                       [facility] Adult PT Treatment/Exercise - 01/28/19 0001      Therapeutic Activites    Therapeutic Activities  Lifting    Lifting  3  different items off low mat 10x eACH with VC to engage abdominals first.    VC to bend her knees     Lumbar Exercises: Aerobic   Nustep  L1 x 4 min warm up      Lumbar Exercises: Supine   AB Set Limitations  Ball squeeze with TA contraction; TC to lower abs   VC for breathing: exhale out mouth for TA: 10x   Clam  10 reps    Clam Limitations  red band added    Added the band to HEP   Bent Knee Raise  10 reps    Bent Knee Raise Limitations  VC to slow speed in order to make better contraction    Bridge with [employer]  5 reps;2 seconds      Manual Therapy   Manual Therapy  Soft tissue mobilization;Myofascial release    Soft tissue mobilization  diaphragm, transvers abdominus, and rectus abdominus               PT Short Term Goals - 01/25/19 1111      PT SHORT TERM GOAL #1   Title  independent with initial HEP    Time  4    Period  Weeks    Status  Achieved  PT SHORT TERM GOAL #2   Title  able to contract the upper abdominals and lower abdominals equally    Time  4    Period  Weeks    Status  Achieved    Target Date  02/04/19      PT SHORT TERM GOAL #3   Title  abdominal pain decreased >/= 25% with activity    Time  4    Period  Weeks    Status  Achieved    Target Date  02/04/19        PT Long Term Goals - 01/07/19 1041      PT LONG TERM GOAL #1   Title  independent with HEP and understand how to progress correctly    Time  8    Period  Weeks    Status  New    Target Date  02/04/19      PT LONG TERM GOAL #2   Title  able to contract abdominals correctlywith lifting, carrying groceries, and transfers due to increased strength 3/5    Time  8    Period  Weeks    Status  New    Target Date  03/04/19      PT LONG TERM GOAL #3   Title  abdominal pain pain with activities is minimal to no pain due to reduction of trigger points and increased strength    Time  8    Period  Weeks    Status  New    Target Date  03/04/19            Plan -  01/28/19 1194    Clinical Impression Statement  Pt presents today with no pain. She had some this morning but abolished it by "rubbing it." Overall abdominal pain is " 75%" improved. Pt needs verbal cures to bend her knees when lifting and reminders to stand tall. she lifted a 10# box from low mat 10x without pain today.    Personal Factors and Comorbidities  Age;Comorbidity 1;Comorbidity 2;Comorbidity 3+    Comorbidities  s/p Gall bladder removal on 08/13/2018, Myocardial infarction, Glaucoma    Examination-Activity Limitations  Lift;Carry;Bend    Examination-Participation Restrictions  Yard Work    Stability/Clinical Decision Making  Evolving/Moderate complexity    Rehab Potential  Excellent    PT Frequency  2x / week    PT Duration  8 weeks    PT Treatment/Interventions  Moist Heat;Cryotherapy;Electrical Stimulation;Therapeutic activities;Therapeutic exercise;Patient/family education;Neuromuscular re-education;Manual techniques;Dry needling;Taping    PT Next Visit Plan  abdominal soft tissue work, bilateral shoulder extension to work on trunk extensor strength, continue to work on lifting and posture    PT Home Exercise Plan  Access Code: [place] and Agree with Plan of Care  Patient       Patient will benefit from skilled therapeutic intervention in order to improve the following deficits and impairments:     Visit Diagnosis: 1. Muscle weakness (generalized)   2. Other lack of coordination   3. Generalized abdominal pain        Problem List Patient Active Problem List   Diagnosis Date Noted  . Coronary artery disease involving native coronary artery of native heart without angina pectoris 08/14/2018  . Cardiomyopathy ([place])   . NSTEMI (non-ST elevated myocardial infarction) ([place]) 04/24/2018  . Essential hypertension 04/24/2018  . Hyperlipidemia with target LDL less than 70 04/24/2018  . Inguinal hernia 01/14/2011    Julie Petty, PTA 01/28/2019, 10:26  AM  [facility] [address], [place], [place], [address] Phone: [phone]   Fax:  [phone]  Name: Julie Petty MRN: [id number] Date of Birth: [birth year]  Access Code: [facility]  URL: [email or web address]  Date: 01/28/2019  Prepared by: Myrene Galas   Exercises  Hooklying Isometric Hip Flexion - 5 reps - 1 sets - 5 sec hold - 2x daily - 7x weekly  Supine Transversus Abdominis Bracing - Hands on Stomach - 10 reps - 2x daily - 7x weekly  Supine Hip Adduction Isometric with Ball - 10 reps - 1 sets - 3 hold - 2x daily - 7x weekly  Supine Bridge with Mini Swiss Ball Between Knees - 10 reps - 1 sets - 1x daily - 7x weekly

## 2019-02-01 ENCOUNTER — Encounter: Payer: Self-pay | Admitting: Physical Therapy

## 2019-02-01 ENCOUNTER — Ambulatory Visit: Payer: Medicare Other | Admitting: Physical Therapy

## 2019-02-01 ENCOUNTER — Other Ambulatory Visit: Payer: Self-pay

## 2019-02-01 DIAGNOSIS — R1084 Generalized abdominal pain: Secondary | ICD-10-CM

## 2019-02-01 DIAGNOSIS — R278 Other lack of coordination: Secondary | ICD-10-CM

## 2019-02-01 DIAGNOSIS — M6281 Muscle weakness (generalized): Secondary | ICD-10-CM | POA: Diagnosis not present

## 2019-02-01 NOTE — Therapy (Signed)
[facility] [address] [place], [place], [address] Phone: [phone]   Fax:  [phone]  Physical Therapy Treatment  Patient Details  Name: Julie Petty MRN: [id number] Date of Birth: [birth year] Referring Provider (PT): Dr. Erroll Luna   Encounter Date: 02/01/2019  PT End of Session - 02/01/19 1032    Visit Number  6    Date for PT Re-Evaluation  04/01/19    PT Start Time  4492    PT Stop Time  1055    PT Time Calculation (min)  40 min    Activity Tolerance  Patient tolerated treatment well    Behavior During Therapy  [facility] for tasks assessed/performed       Past Medical History:  Diagnosis Date  . Complication of anesthesia   . Essential hypertension   . GERD (gastroesophageal reflux disease)   . Glaucoma   . Hernia   . Hyperlipemia   . Myocardial infarction ([place])    NSTEMI 04/24/18 (40% D2 by LHC; Dx: Takotsubo CM vs coronary spasm vs myocarditis)  . PONV (postoperative nausea and vomiting)     Past Surgical History:  Procedure Laterality Date  . CARDIAC CATHETERIZATION  04/05/03  . CHOLECYSTECTOMY N/A 08/17/2018   Procedure: Laparoscopic Cholecystectomy;  Surgeon: Erroll Luna, MD;  Location: [place];  Service: General;  Laterality: N/A;  . KNEE ARTHROSCOPY  09/17/05   left  . LEFT HEART CATH AND CORONARY ANGIOGRAPHY N/A 04/26/2018   Procedure: LEFT HEART CATH AND CORONARY ANGIOGRAPHY;  Surgeon: Leonie Man, MD;  Location: [place] [facility];  Service: Cardiovascular;  Laterality: N/A;  . VAGINAL HYSTERECTOMY  1980's    There were no vitals filed for this visit.  Subjective Assessment - 02/01/19 1026    Subjective  I have fatique yesterday. My son said I am standing straighter. I over did it yesterday with cooking, went to the dentist and made dinner. My legs feel very week.    Patient Stated Goals  reduce pain    Currently in Pain?  Yes    Pain Score  2     Pain Location  Abdomen     Pain Orientation  Lower    Pain Descriptors / Indicators  Burning;Stabbing    Pain Type  Chronic pain    Pain Onset  More than a month ago    Pain Frequency  Intermittent    Aggravating Factors   tugging, bending over, stand at counter, touching, sweeping, carrying, groceries.    Pain Relieving Factors  rubbing belly    Multiple Pain Sites  No         [facility] PT Assessment - 02/01/19 0001      Assessment   Medical Diagnosis  S39.011A Abdominal Wall Strain    Referring Provider (PT)  Dr. 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   Overall Cognitive Status  Within Functional Limits for tasks assessed      Strength   Overall Strength Comments  abdominal strength 2/5    Right Hip Extension  4/5    Right Hip ABduction  4/5    Left Hip Extension  4/5    Left Hip ABduction  4/5      6 minute walk test results    Endurance additional comments  1153 feet for 6 min   average should be 1278 feet     Standardized Balance Assessment   Five times sit to stand comments   17 sec with difficulty on controlled sitting                   [facility] Adult PT Treatment/Exercise - 02/01/19 0001      Therapeutic Activites    Therapeutic Activities  Lifting      Lumbar Exercises: Aerobic   Nustep  L1 x 5 min warm up   while assessing patient     Lumbar Exercises: Supine   Bridge with clamshell  15 reps;1 second   with red band   Bridge with [employer] Limitations  15 times               PT Short Term Goals - 01/25/19 1111      PT SHORT TERM GOAL #1   Title  independent with initial HEP    Time  4    Period  Weeks    Status  Achieved      PT SHORT TERM GOAL #2   Title  able to  contract the upper abdominals and lower abdominals equally    Time  4    Period  Weeks    Status  Achieved    Target Date  02/04/19      PT SHORT TERM GOAL #3   Title  abdominal pain decreased >/= 25% with activity    Time  4    Period  Weeks    Status  Achieved    Target Date  02/04/19        PT Long Term Goals - 02/01/19 1053      PT LONG TERM GOAL #1   Title  independent with HEP and understand how to progress correctly    Baseline  still learning    Time  8    Period  Weeks    Status  On-going      PT LONG TERM GOAL #2   Title  able to contract abdominals correctlywith lifting, carrying groceries, and transfers due to increased strength 3/5    Time  8    Period  Weeks    Status  On-going      PT LONG TERM GOAL #3   Title  abdominal pain pain with activities is minimal to no pain due to reduction of trigger points and increased strength    Baseline  2/10    Time  8    Period  Weeks    Status  On-going      PT LONG TERM GOAL #4   Title  walk for 6 minutes >/= 1278 feet due to reduction of fatique with tasks    Time  8    Period  Weeks    Status  New    Target Date  03/29/19            Plan - 02/01/19 1030    Clinical Impression Statement  Patient has increased abdominal strength and tone. She is now able to cook and do many tasks in one day but has 2/10 abdominal pain. Patient reports her fatique is prevelent. Patient walks 1253 feet  in 6 minutes but average is 1278 feet putting her at slight risk for falls and reduction in endurance. Patient has weakness in bilateral hip abduction and extension. Patient continues to work on her lifting with verbal cues to extend her spine instead of flexing at her thoracic. Patient will benefit from skillled therapy to work on abdominal and core strength, endurance and correct lifting technique.    Personal Factors and Comorbidities  Age;Comorbidity 1;Comorbidity 2;Comorbidity 3+    Comorbidities  s/p Gall bladder removal on  08/13/2018, Myocardial infarction, Glaucoma    Examination-Participation Restrictions  Yard Work    Stability/Clinical Decision Making  Evolving/Moderate complexity    Rehab Potential  Excellent    PT Frequency  2x / week    PT Duration  8 weeks    PT Treatment/Interventions  Moist Heat;Cryotherapy;Electrical Stimulation;Therapeutic activities;Therapeutic exercise;Patient/family education;Neuromuscular re-education;Manual techniques;Dry needling;Taping    PT Next Visit Plan  abdominal soft tissue work, bilateral shoulder extension to work on trunk extensor strength, continue to work on lifting and posture ;walking 6 minutes    PT Home Exercise Plan  Access Code: [email or web address]    Consulted and Agree with Plan of Care  Patient       Patient will benefit from skilled therapeutic intervention in order to improve the following deficits and impairments:  Decreased coordination, Increased fascial restricitons, Increased muscle spasms, Decreased activity tolerance, Pain, Decreased scar mobility, Decreased mobility, Decreased strength  Visit Diagnosis: 1. Muscle weakness (generalized)   2. Other lack of coordination   3. Generalized abdominal pain        Problem List Patient Active Problem List   Diagnosis Date Noted  . Coronary artery disease involving native coronary artery of native heart without angina pectoris 08/14/2018  . Cardiomyopathy ([place])   . NSTEMI (non-ST elevated myocardial infarction) ([place]) 04/24/2018  . Essential hypertension 04/24/2018  . Hyperlipidemia with target LDL less than 70 04/24/2018  . Inguinal hernia 01/14/2011    Earlie Counts, PT 02/01/19 11:03 AM  [facility] [address], [place], [place], [address] Phone: [phone]   Fax:  [phone]  Name: Julie Petty MRN: [id number] Date of Birth: 06/29/1931

## 2019-02-04 ENCOUNTER — Ambulatory Visit: Payer: Medicare Other | Admitting: Physical Therapy

## 2019-02-07 ENCOUNTER — Ambulatory Visit: Payer: Medicare Other | Admitting: Physical Therapy

## 2019-02-07 ENCOUNTER — Other Ambulatory Visit: Payer: Self-pay

## 2019-02-07 ENCOUNTER — Encounter: Payer: Self-pay | Admitting: Physical Therapy

## 2019-02-07 DIAGNOSIS — R278 Other lack of coordination: Secondary | ICD-10-CM

## 2019-02-07 DIAGNOSIS — M6281 Muscle weakness (generalized): Secondary | ICD-10-CM

## 2019-02-07 DIAGNOSIS — R1084 Generalized abdominal pain: Secondary | ICD-10-CM

## 2019-02-07 NOTE — Therapy (Signed)
[facility] [address], [place], [place], [address] Phone: [phone]   Fax:  [phone]  Physical Therapy Treatment  Patient Details  Name: Julie Petty MRN: [id number] Date of Birth: February 12, 1932 Referring Provider (PT): Dr. Erroll Luna   Encounter Date: 02/07/2019  PT End of Session - 02/07/19 0937    Visit Number  7    Date for PT Re-Evaluation  04/01/19    PT Start Time  0932    PT Stop Time  1011    PT Time Calculation (min)  39 min    Activity Tolerance  Patient tolerated treatment well    Behavior During Therapy  [facility] for tasks assessed/performed       Past Medical History:  Diagnosis Date  . Complication of anesthesia   . Essential hypertension   . GERD (gastroesophageal reflux disease)   . Glaucoma   . Hernia   . Hyperlipemia   . Myocardial infarction ([place])    NSTEMI 04/24/18 (40% D2 by LHC; Dx: Takotsubo CM vs coronary spasm vs myocarditis)  . PONV (postoperative nausea and vomiting)     Past Surgical History:  Procedure Laterality Date  . CARDIAC CATHETERIZATION  04/05/03  . CHOLECYSTECTOMY N/A 08/17/2018   Procedure: Laparoscopic Cholecystectomy;  Surgeon: Erroll Luna, MD;  Location: [place];  Service: General;  Laterality: N/A;  . KNEE ARTHROSCOPY  09/17/05   left  . LEFT HEART CATH AND CORONARY ANGIOGRAPHY N/A 04/26/2018   Procedure: LEFT HEART CATH AND CORONARY ANGIOGRAPHY;  Surgeon: Leonie Man, MD;  Location: [place] [facility];  Service: Cardiovascular;  Laterality: N/A;  . VAGINAL HYSTERECTOMY  1980's    There were no vitals filed for this visit.  Subjective Assessment - 02/07/19 0939    Subjective  Last night I felt bad. I had a headache and just felt bad. This Am I am fine.    Currently in Pain?  No/denies    Multiple Pain Sites  No                       [facility] Adult PT Treatment/Exercise - 02/07/19 0001      Lumbar Exercises: Aerobic   Nustep  L1  x 6 min   PTA present to monitor endurance, breathing     Lumbar Exercises: Standing   Shoulder Extension  Strengthening;Both;10 reps;Theraband    Theraband Level (Shoulder Extension)  Level 2 (Red)    Other Standing Lumbar Exercises  Tall walking x 3 min VC to lift through crown of the head. lift lower abs.       Lumbar Exercises: Seated   Sit to Stand  10 reps    Sit to Stand Limitations  Added yellow band horizontal abd with standing 10x   VC for thoracic extension      Lumbar Exercises: Supine   AB Set Limitations  Ball squeeze with TA contraction; TC to lower abs   VC and TC to do TA correctly, pt was bulging  10x   Clam  15 reps    Clam Limitations  Green band 15x Vc to contract side hips    Straight Leg Raise  --   6x bil TC to lower abs, PTA TC to post pelvis to keep pelvis              PT Short Term Goals - 01/25/19 1111      PT SHORT TERM GOAL #1   Title  independent with initial HEP    Time  4    Period  Weeks    Status  Achieved      PT SHORT TERM GOAL #2   Title  able to contract the upper abdominals and lower abdominals equally    Time  4    Period  Weeks    Status  Achieved    Target Date  02/04/19      PT SHORT TERM GOAL #3   Title  abdominal pain decreased >/= 25% with activity    Time  4    Period  Weeks    Status  Achieved    Target Date  02/04/19        PT Long Term Goals - 02/01/19 1053      PT LONG TERM GOAL #1   Title  independent with HEP and understand how to progress correctly    Baseline  still learning    Time  8    Period  Weeks    Status  On-going      PT LONG TERM GOAL #2   Title  able to contract abdominals correctlywith lifting, carrying groceries, and transfers due to increased strength 3/5    Time  8    Period  Weeks    Status  On-going      PT LONG TERM GOAL #3   Title  abdominal pain pain with activities is minimal to no pain due to reduction of trigger points and increased strength    Baseline  2/10     Time  8    Period  Weeks    Status  On-going      PT LONG TERM GOAL #4   Title  walk for 6 minutes >/= 1278 feet due to reduction of fatique with tasks    Time  8    Period  Weeks    Status  New    Target Date  03/29/19            Plan - 02/07/19 9518    Clinical Impression Statement  Pt had difficulty with her lower abdominal contractions today. She reveretd back multiple times to bulging her lower abdomnal. Pt got back on track with cuing both verbal and tactile to the lower abdomnals. Worked on improving pt's endurance levels today, mild fatigue reported at end of session.    Personal Factors and Comorbidities  Age;Comorbidity 1;Comorbidity 2;Comorbidity 3+    Comorbidities  s/p Gall bladder removal on 08/13/2018, Myocardial infarction, Glaucoma    Examination-Activity Limitations  Lift;Carry;Bend    Examination-Participation Restrictions  Yard Work    Stability/Clinical Decision Making  Evolving/Moderate complexity    Rehab Potential  Excellent    PT Frequency  2x / week    PT Duration  8 weeks    PT Treatment/Interventions  Moist Heat;Cryotherapy;Electrical Stimulation;Therapeutic activities;Therapeutic exercise;Patient/family education;Neuromuscular re-education;Manual techniques;Dry needling;Taping    PT Next Visit Plan  abdominal soft tissue work, bilateral shoulder extension to work on trunk extensor strength, continue to work on lifting and posture ;walking 6 minutes    PT Home Exercise Plan  Access Code: [email or web address]    Consulted and Agree with Plan of Care  Patient       Patient will benefit from skilled therapeutic intervention in order to improve the following deficits and impairments:  Decreased coordination, Increased fascial restricitons, Increased muscle spasms, Decreased activity tolerance, Pain, Decreased scar mobility, Decreased mobility, Decreased strength  Visit Diagnosis: 1. Muscle weakness (generalized)  2. Other lack of coordination   3. Generalized  abdominal pain        Problem List Patient Active Problem List   Diagnosis Date Noted  . Coronary artery disease involving native coronary artery of native heart without angina pectoris 08/14/2018  . Cardiomyopathy ([place])   . NSTEMI (non-ST elevated myocardial infarction) ([place]) 04/24/2018  . Essential hypertension 04/24/2018  . Hyperlipidemia with target LDL less than 70 04/24/2018  . Inguinal hernia 01/14/2011    Ryett Hamman, PTA 02/07/2019, 10:12 AM  [facility] [address], [place], [place], [address] Phone: [phone]   Fax:  [phone]  Name: AUTUMNROSE YORE MRN: [id number] Date of Birth: 12/05/1931

## 2019-02-10 ENCOUNTER — Encounter: Payer: Medicare Other | Admitting: Physical Therapy

## 2019-02-11 ENCOUNTER — Ambulatory Visit: Payer: Medicare Other | Admitting: Physical Therapy

## 2019-02-14 ENCOUNTER — Other Ambulatory Visit: Payer: Self-pay

## 2019-02-14 ENCOUNTER — Encounter: Payer: Self-pay | Admitting: Physical Therapy

## 2019-02-14 ENCOUNTER — Ambulatory Visit: Payer: Medicare Other | Admitting: Physical Therapy

## 2019-02-14 DIAGNOSIS — M6281 Muscle weakness (generalized): Secondary | ICD-10-CM | POA: Diagnosis not present

## 2019-02-14 DIAGNOSIS — R278 Other lack of coordination: Secondary | ICD-10-CM

## 2019-02-14 DIAGNOSIS — R1084 Generalized abdominal pain: Secondary | ICD-10-CM

## 2019-02-14 NOTE — Therapy (Signed)
[facility] [address], [place], [place], [address] Phone: [phone]   Fax:  [phone]  Physical Therapy Treatment  Patient Details  Name: Julie Petty MRN: [id number] Date of Birth: [birth year] Referring Provider (PT): Dr. Erroll Luna   Encounter Date: 02/14/2019  PT End of Session - 02/14/19 1156    Visit Number  8    Date for PT Re-Evaluation  04/01/19    Authorization Type  UHC medicare    PT Start Time  1115    PT Stop Time  1153    PT Time Calculation (min)  38 min    Activity Tolerance  Patient tolerated treatment well    Behavior During Therapy  [facility] for tasks assessed/performed       Past Medical History:  Diagnosis Date  . Complication of anesthesia   . Essential hypertension   . GERD (gastroesophageal reflux disease)   . Glaucoma   . Hernia   . Hyperlipemia   . Myocardial infarction ([place])    NSTEMI 04/24/18 (40% D2 by LHC; Dx: Takotsubo CM vs coronary spasm vs myocarditis)  . PONV (postoperative nausea and vomiting)     Past Surgical History:  Procedure Laterality Date  . CARDIAC CATHETERIZATION  04/05/03  . CHOLECYSTECTOMY N/A 08/17/2018   Procedure: Laparoscopic Cholecystectomy;  Surgeon: Erroll Luna, MD;  Location: [place];  Service: General;  Laterality: N/A;  . KNEE ARTHROSCOPY  09/17/05   left  . LEFT HEART CATH AND CORONARY ANGIOGRAPHY N/A 04/26/2018   Procedure: LEFT HEART CATH AND CORONARY ANGIOGRAPHY;  Surgeon: Leonie Man, MD;  Location: [place] [facility];  Service: Cardiovascular;  Laterality: N/A;  . VAGINAL HYSTERECTOMY  1980's    There were no vitals filed for this visit.  Subjective Assessment - 02/14/19 1120    Subjective  Abdominal pain is better. I have moped my floor due to feeling better and hurt my knee.    Patient Stated Goals  reduce pain    Currently in Pain?  Yes    Pain Score  3     Pain Location  Abdomen    Pain Orientation  Lower    Pain  Descriptors / Indicators  Stabbing    Pain Type  Chronic pain    Pain Onset  More than a month ago    Pain Frequency  Intermittent    Aggravating Factors   tugging, bending over, stand at counter and touch her abdomen    Pain Relieving Factors  rubbing belly                       [facility] Adult PT Treatment/Exercise - 02/14/19 0001      Lumbar Exercises: Aerobic   Nustep  L1 x 6 min   PTA present to monitor endurance, breathing     Lumbar Exercises: Standing   Shoulder Extension  Strengthening;Both;10 reps;Theraband   2 sets   Theraband Level (Shoulder Extension)  Level 2 (Red)    Other Standing Lumbar Exercises  horizontal abduction with yellow band 20 times      Lumbar Exercises: Seated   Sit to Stand  10 reps   VC on sitting with control     Lumbar Exercises: Supine   AB Set Limitations  Ball squeeze with TA contraction; TC to lower abs   VC and TC to do TA correctly, pt was bulging  10x   Bridge with [employer]  15 reps;1 second  Bridge with clamshell  15 reps;1 second   with green band     Manual Therapy   Manual Therapy  Myofascial release    Myofascial Release  left lower quadrant on the abdomen to release the fascia               PT Short Term Goals - 01/25/19 1111      PT SHORT TERM GOAL #1   Title  independent with initial HEP    Time  4    Period  Weeks    Status  Achieved      PT SHORT TERM GOAL #2   Title  able to contract the upper abdominals and lower abdominals equally    Time  4    Period  Weeks    Status  Achieved    Target Date  02/04/19      PT SHORT TERM GOAL #3   Title  abdominal pain decreased >/= 25% with activity    Time  4    Period  Weeks    Status  Achieved    Target Date  02/04/19        PT Long Term Goals - 02/14/19 1159      PT LONG TERM GOAL #1   Title  independent with HEP and understand how to progress correctly    Baseline  still learning    Time  8    Period  Weeks    Status  On-going       PT LONG TERM GOAL #2   Title  able to contract abdominals correctlywith lifting, carrying groceries, and transfers due to increased strength 3/5    Time  8    Period  Weeks    Status  On-going      PT LONG TERM GOAL #3   Title  abdominal pain pain with activities is minimal to no pain due to reduction of trigger points and increased strength    Baseline  2/10    Time  8    Period  Weeks    Status  On-going      PT LONG TERM GOAL #4   Title  walk for 6 minutes >/= 1278 feet due to reduction of fatique with tasks    Time  8    Period  Weeks    Status  New            Plan - 02/14/19 1157    Clinical Impression Statement  Patient was able to clean her floor with getting on her hands and knees for the first time. Patient is able to perform all of her tasks with less pain. Patient is not carrying items due to her children doing it for her. Patient has improve abdominal contraction with her exercise. Patient will benefit from skilled therapy to work on endurance and abdominal strength while working on her posture.    Personal Factors and Comorbidities  Age;Comorbidity 1;Comorbidity 2;Comorbidity 3+    Comorbidities  s/p Gall bladder removal on 08/13/2018, Myocardial infarction, Glaucoma    Examination-Activity Limitations  Lift;Carry;Bend    Examination-Participation Restrictions  Yard Work    Stability/Clinical Decision Making  Evolving/Moderate complexity    Rehab Potential  Excellent    PT Frequency  2x / week    PT Duration  8 weeks    PT Treatment/Interventions  Moist Heat;Cryotherapy;Electrical Stimulation;Therapeutic activities;Therapeutic exercise;Patient/family education;Neuromuscular re-education;Manual techniques;Dry needling;Taping    PT Next Visit Plan  bilateral shoulder extension to work on  trunk extensor strength, continue to work on lifting and posture ;walking 6 minutes    PT Home Exercise Plan  Access Code: [email or web address]    Consulted and Agree with Plan of Care   Patient       Patient will benefit from skilled therapeutic intervention in order to improve the following deficits and impairments:  Decreased coordination, Increased fascial restricitons, Increased muscle spasms, Decreased activity tolerance, Pain, Decreased scar mobility, Decreased mobility, Decreased strength  Visit Diagnosis: Muscle weakness (generalized)  Other lack of coordination  Generalized abdominal pain     Problem List Patient Active Problem List   Diagnosis Date Noted  . Coronary artery disease involving native coronary artery of native heart without angina pectoris 08/14/2018  . Cardiomyopathy ([place])   . NSTEMI (non-ST elevated myocardial infarction) ([place]) 04/24/2018  . Essential hypertension 04/24/2018  . Hyperlipidemia with target LDL less than 70 04/24/2018  . Inguinal hernia 01/14/2011    Earlie Counts, PT 02/14/19 12:01 PM    [facility] [address] [place], [place], [address] Phone: [phone]   Fax:  [phone]  Name: Julie Petty MRN: [id number] Date of Birth: [birth year]

## 2019-02-21 ENCOUNTER — Encounter: Payer: Self-pay | Admitting: Physical Therapy

## 2019-02-21 ENCOUNTER — Ambulatory Visit: Payer: Medicare Other | Admitting: Physical Therapy

## 2019-02-21 ENCOUNTER — Other Ambulatory Visit: Payer: Self-pay

## 2019-02-21 DIAGNOSIS — M6281 Muscle weakness (generalized): Secondary | ICD-10-CM

## 2019-02-21 DIAGNOSIS — R278 Other lack of coordination: Secondary | ICD-10-CM

## 2019-02-21 DIAGNOSIS — R1084 Generalized abdominal pain: Secondary | ICD-10-CM

## 2019-02-21 NOTE — Therapy (Signed)
[facility] [address], [place], [place], [address] Phone: [phone]   Fax:  [phone]  Physical Therapy Treatment  Patient Details  Name: Julie Petty MRN: [id number] Date of Birth: February 18, 1932 Referring Provider (PT): Dr. Erroll Luna   Encounter Date: 02/21/2019  PT End of Session - 02/21/19 1020    Visit Number  9    Date for PT Re-Evaluation  04/01/19    Authorization Type  [employer]HC medicare    PT Start Time  1015    PT Stop Time  1055    PT Time Calculation (min)  40 min    Activity Tolerance  Patient tolerated treatment well    Behavior During Therapy  [facility] for tasks assessed/performed       Past Medical History:  Diagnosis Date  . Complication of anesthesia   . Essential hypertension   . GERD (gastroesophageal reflux disease)   . Glaucoma   . Hernia   . Hyperlipemia   . Myocardial infarction ([place])    NSTEMI 04/24/18 (40% D2 by LHC; Dx: Takotsubo CM vs coronary spasm vs myocarditis)  . PONV (postoperative nausea and vomiting)     Past Surgical History:  Procedure Laterality Date  . CARDIAC CATHETERIZATION  04/05/03  . CHOLECYSTECTOMY N/A 08/17/2018   Procedure: Laparoscopic Cholecystectomy;  Surgeon: Erroll Luna, MD;  Location: [place];  Service: General;  Laterality: N/A;  . KNEE ARTHROSCOPY  09/17/05   left  . LEFT HEART CATH AND CORONARY ANGIOGRAPHY N/A 04/26/2018   Procedure: LEFT HEART CATH AND CORONARY ANGIOGRAPHY;  Surgeon: Leonie Man, MD;  Location: [place] [facility];  Service: Cardiovascular;  Laterality: N/A;  . VAGINAL HYSTERECTOMY  1980's    There were no vitals filed for this visit.  Subjective Assessment - 02/21/19 1022    Subjective  I over did it with my exercises yesterday. My muscles were so sore, today is better, no pain or soreness.    Currently in Pain?  No/denies    Multiple Pain Sites  No                       [facility] Adult PT  Treatment/Exercise - 02/21/19 0001      Therapeutic Activites    Lifting  5# lift and carry around room 3x      Lumbar Exercises: Aerobic   Nustep  L2 x 6 min PTA present      Lumbar Exercises: Standing   Shoulder Extension  Strengthening;Both;10 reps;Theraband   2 sets   Theraband Level (Shoulder Extension)  Level 2 (Red)      Lumbar Exercises: Supine   Glut Set  --   Add/glute squeeze 3 sec hold 10x   Bridge with [employer]  15 reps;1 second    Bridge with clamshell  15 reps;1 second   with green band   Straight Leg Raise  10 reps   Bil, VC to contract abs     Manual Therapy   Myofascial Release  Bil Psoas soft tissue work               PT Short Term Goals - 01/25/19 1111      PT SHORT TERM GOAL #1   Title  independent with initial HEP    Time  4    Period  Weeks    Status  Achieved      PT SHORT TERM GOAL #2   Title  able to  contract the upper abdominals and lower abdominals equally    Time  4    Period  Weeks    Status  Achieved    Target Date  02/04/19      PT SHORT TERM GOAL #3   Title  abdominal pain decreased >/= 25% with activity    Time  4    Period  Weeks    Status  Achieved    Target Date  02/04/19        PT Long Term Goals - 02/14/19 1159      PT LONG TERM GOAL #1   Title  independent with HEP and understand how to progress correctly    Baseline  still learning    Time  8    Period  Weeks    Status  On-going      PT LONG TERM GOAL #2   Title  able to contract abdominals correctlywith lifting, carrying groceries, and transfers due to increased strength 3/5    Time  8    Period  Weeks    Status  On-going      PT LONG TERM GOAL #3   Title  abdominal pain pain with activities is minimal to no pain due to reduction of trigger points and increased strength    Baseline  2/10    Time  8    Period  Weeks    Status  On-going      PT LONG TERM GOAL #4   Title  walk for 6 minutes >/= 1278 feet due to reduction of fatique with tasks     Time  8    Period  Weeks    Status  New            Plan - 02/21/19 1051    Clinical Impression Statement  Pt reports she overdid her exercises yesterday and made her stomach muscles very sore. We discussed appropriate levels of exertion with her HEP. She had no pain or soreness today. She was able to lift and carry 5# around the clinic without pain. Pt requires verbal cues for upright posture when perfromingher standing exercises.    Personal Factors and Comorbidities  Age;Comorbidity 1;Comorbidity 2;Comorbidity 3+    Comorbidities  s/p Gall bladder removal on 08/13/2018, Myocardial infarction, Glaucoma    Examination-Activity Limitations  Lift;Carry;Bend    Examination-Participation Restrictions  Yard Work    Stability/Clinical Decision Making  Evolving/Moderate complexity    Rehab Potential  Excellent    PT Frequency  2x / week    PT Duration  8 weeks    PT Treatment/Interventions  Moist Heat;Cryotherapy;Electrical Stimulation;Therapeutic activities;Therapeutic exercise;Patient/family education;Neuromuscular re-education;Manual techniques;Dry needling;Taping    PT Next Visit Plan  bilateral shoulder extension to work on trunk extensor strength, continue to work on lifting and posture: 10th visits PN    PT Home Exercise Plan  Access Code: [email or web address]    Consulted and Agree with Plan of Care  Patient       Patient will benefit from skilled therapeutic intervention in order to improve the following deficits and impairments:  Decreased coordination, Increased fascial restricitons, Increased muscle spasms, Decreased activity tolerance, Pain, Decreased scar mobility, Decreased mobility, Decreased strength  Visit Diagnosis: Muscle weakness (generalized)  Other lack of coordination  Generalized abdominal pain     Problem List Patient Active Problem List   Diagnosis Date Noted  . Coronary artery disease involving native coronary artery of native heart without angina pectoris  08/14/2018  . Cardiomyopathy ([place])   .  NSTEMI (non-ST elevated myocardial infarction) ([place]) 04/24/2018  . Essential hypertension 04/24/2018  . Hyperlipidemia with target LDL less than 70 04/24/2018  . Inguinal hernia 01/14/2011    Dianna Deshler, PTA 02/21/2019, 10:57 AM  [facility] [address] [place], [place], [address] Phone: [phone]   Fax:  [phone]  Name: Julie Petty MRN: [id number] Date of Birth: 05-May-1932

## 2019-03-01 ENCOUNTER — Other Ambulatory Visit: Payer: Self-pay

## 2019-03-01 ENCOUNTER — Encounter: Payer: Self-pay | Admitting: Physical Therapy

## 2019-03-01 ENCOUNTER — Ambulatory Visit: Payer: Medicare Other | Attending: Surgery | Admitting: Physical Therapy

## 2019-03-01 DIAGNOSIS — R1084 Generalized abdominal pain: Secondary | ICD-10-CM | POA: Insufficient documentation

## 2019-03-01 DIAGNOSIS — M6281 Muscle weakness (generalized): Secondary | ICD-10-CM | POA: Diagnosis present

## 2019-03-01 DIAGNOSIS — R278 Other lack of coordination: Secondary | ICD-10-CM | POA: Insufficient documentation

## 2019-03-01 NOTE — Therapy (Signed)
[facility] [address], [place], [place], [address] Phone: [phone]   Fax:  [phone]  Physical Therapy Treatment  Patient Details  Name: Julie Petty MRN: [id number] Date of Birth: 21-Oct-1931 Referring Provider (PT): Dr. Erroll Luna Progress Note Reporting Period 01/07/2019 to 03/01/2019  See note below for Objective Data and Assessment of Progress/Goals.       Encounter Date: 03/01/2019  PT End of Session - 03/01/19 1141    Visit Number  10    Date for PT Re-Evaluation  04/01/19    Authorization Type  [employer] medicare    PT Start Time  1100    PT Stop Time  1138    PT Time Calculation (min)  38 min    Activity Tolerance  Patient tolerated treatment well    Behavior During Therapy  [facility] for tasks assessed/performed       Past Medical History:  Diagnosis Date  . Complication of anesthesia   . Essential hypertension   . GERD (gastroesophageal reflux disease)   . Glaucoma   . Hernia   . Hyperlipemia   . Myocardial infarction ([place])    NSTEMI 04/24/18 (40% D2 by LHC; Dx: Takotsubo CM vs coronary spasm vs myocarditis)  . PONV (postoperative nausea and vomiting)     Past Surgical History:  Procedure Laterality Date  . CARDIAC CATHETERIZATION  04/05/03  . CHOLECYSTECTOMY N/A 08/17/2018   Procedure: Laparoscopic Cholecystectomy;  Surgeon: Erroll Luna, MD;  Location: [place];  Service: General;  Laterality: N/A;  . KNEE ARTHROSCOPY  09/17/05   left  . LEFT HEART CATH AND CORONARY ANGIOGRAPHY N/A 04/26/2018   Procedure: LEFT HEART CATH AND CORONARY ANGIOGRAPHY;  Surgeon: Leonie Man, MD;  Location: [place] [facility];  Service: Cardiovascular;  Laterality: N/A;  . VAGINAL HYSTERECTOMY  1980's    There were no vitals filed for this visit.  Subjective Assessment - 03/01/19 1104    Subjective  I was a little sore yesterday but today I feel fine. In one week patient will have no pai 75% better. When  she exercises she feels better.    Patient Stated Goals  reduce pain    Currently in Pain?  No/denies         [facility] PT Assessment - 03/01/19 0001      Assessment   Medical Diagnosis  S39.011A Abdominal Wall Strain    Referring Provider (PT)  Dr. Marcello Moores [place]    Onset Date/Surgical Date  08/13/18    Prior Therapy  none      Precautions   Precautions  None      Restrictions   Weight Bearing Restrictions  No      Home Environment   Living Environment  Private residence      Prior Function   Level of [place]  Retired      Associate Professor   Overall Cognitive Status  Within Functional Limits for tasks assessed      Posture/Postural Control   Posture/Postural Control  Postural limitations    Postural Limitations  Rounded Shoulders;Forward head      ROM / Strength   AROM / PROM / Strength  AROM;PROM;Strength      Strength   Overall Strength Comments  abdominal strength 2+/5    Right Hip Extension  4+/5    Right Hip ABduction  4+/5    Left Hip Extension  4+/5    Left Hip ABduction  4+/5      6 minute walk test results    Endurance additional comments  1310 feet for 6 min   average should be 1278 feet     Standardized Balance Assessment   Five times sit to stand comments   10 sec with no hands                   [facility] Adult PT Treatment/Exercise - 03/01/19 0001      Lumbar Exercises: Supine   Bridge with Ball Squeeze  15 reps;5 seconds      Manual Therapy   Manual Therapy  Myofascial release    Manual therapy comments  educated patient on how to perfrom myofascial release on the inside of the ilium    Myofascial Release  release of the lower quadrant to relieve pain, lift the intestines off the bladder and promote peristalic motion             PT Education - 03/01/19 1140    Education Details  isntructed patient on how to perform abdominal massage for the lower quadrant pain    Person(s) Educated  Patient    Methods   Explanation;Demonstration    Comprehension  Verbalized understanding;Returned demonstration       PT Short Term Goals - 01/25/19 1111      PT SHORT TERM GOAL #1   Title  independent with initial HEP    Time  4    Period  Weeks    Status  Achieved      PT SHORT TERM GOAL #2   Title  able to contract the upper abdominals and lower abdominals equally    Time  4    Period  Weeks    Status  Achieved    Target Date  02/04/19      PT SHORT TERM GOAL #3   Title  abdominal pain decreased >/= 25% with activity    Time  4    Period  Weeks    Status  Achieved    Target Date  02/04/19        PT Long Term Goals - 03/01/19 1106      PT LONG TERM GOAL #1   Title  independent with HEP and understand how to progress correctly    Baseline  still learning    Time  8    Period  Weeks    Status  On-going      PT LONG TERM GOAL #2   Title  able to contract abdominals correctlywith lifting, carrying groceries, and transfers due to increased strength 3/5    Time  8    Period  Weeks    Status  On-going      PT LONG TERM GOAL #3   Title  abdominal pain pain with activities is minimal to no pain due to reduction of trigger points and increased strength    Baseline  2/10    Time  8    Period  Weeks    Status  On-going      PT LONG TERM GOAL #4   Title  walk for 6 minutes >/= 1278 feet due to reduction of fatique with tasks    Baseline  1310 feet    Time  8    Period  Weeks    Status  Achieved            Plan - 03/01/19 1132    Clinical Impression Statement  Patient wears a  wide belt on the abdomen while she does work and feels like it gives her support. Patient reports pain decreased by 75%. Patient reports some days with pain and some without pain. Patient able to walk 1310 feet which is 157 feet more than the initial. Sit to stand in 5x in 10 sec. instead of 17 seconds. Patient had some tenderness located in the lower abdominal and felt better after manaual work.Patient will  benefit from skilled therapy to work on abdominal strength and reduce pain.    Personal Factors and Comorbidities  Age;Comorbidity 1;Comorbidity 2;Comorbidity 3+    Comorbidities  s/p Gall bladder removal on 08/13/2018, Myocardial infarction, Glaucoma    Examination-Participation Restrictions  Yard Work    Stability/Clinical Decision Making  Evolving/Moderate complexity    Rehab Potential  Excellent    PT Frequency  2x / week    PT Duration  8 weeks    PT Treatment/Interventions  Moist Heat;Cryotherapy;Electrical Stimulation;Therapeutic activities;Therapeutic exercise;Patient/family education;Neuromuscular re-education;Manual techniques;Dry needling;Taping    PT Next Visit Plan  bilateral shoulder extension to work on trunk extensor strength, continue to work on lifting and posture: help patient put brace on    PT Home Exercise Plan  Access Code: [email or web address]    Consulted and Agree with Plan of Care  Patient       Patient will benefit from skilled therapeutic intervention in order to improve the following deficits and impairments:  Decreased coordination, Increased fascial restricitons, Increased muscle spasms, Decreased activity tolerance, Pain, Decreased scar mobility, Decreased mobility, Decreased strength  Visit Diagnosis: Muscle weakness (generalized)  Other lack of coordination  Generalized abdominal pain     Problem List Patient Active Problem List   Diagnosis Date Noted  . Coronary artery disease involving native coronary artery of native heart without angina pectoris 08/14/2018  . Cardiomyopathy ([place])   . NSTEMI (non-ST elevated myocardial infarction) ([place]) 04/24/2018  . Essential hypertension 04/24/2018  . Hyperlipidemia with target LDL less than 70 04/24/2018  . Inguinal hernia 01/14/2011   Earlie Counts, PT 03/01/19 11:43 AM   [facility] [address] [place], [place], [address] Phone: [phone]   Fax:   [phone]  Name: SUNIYAH MOUSLEY MRN: [id number] Date of Birth: [birth year]

## 2019-03-07 ENCOUNTER — Encounter: Payer: Self-pay | Admitting: Physical Therapy

## 2019-03-07 ENCOUNTER — Other Ambulatory Visit: Payer: Self-pay

## 2019-03-07 ENCOUNTER — Ambulatory Visit: Payer: Medicare Other | Admitting: Physical Therapy

## 2019-03-07 DIAGNOSIS — M6281 Muscle weakness (generalized): Secondary | ICD-10-CM

## 2019-03-07 DIAGNOSIS — R278 Other lack of coordination: Secondary | ICD-10-CM

## 2019-03-07 DIAGNOSIS — R1084 Generalized abdominal pain: Secondary | ICD-10-CM

## 2019-03-07 NOTE — Therapy (Signed)
[facility] [address], [place], [place], [address] Phone: [phone]   Fax:  [phone]  Physical Therapy Treatment  Patient Details  Name: Julie Petty MRN: [id number] Date of Birth: [birth year] Referring Provider (PT): Dr. Erroll Luna   Encounter Date: 03/07/2019  PT End of Session - 03/07/19 1106    Visit Number  11    Date for PT Re-Evaluation  04/01/19    Authorization Type  UHC medicare    PT Start Time  1104    PT Stop Time  1145    PT Time Calculation (min)  41 min    Activity Tolerance  Patient tolerated treatment well    Behavior During Therapy  [facility] for tasks assessed/performed       Past Medical History:  Diagnosis Date  . Complication of anesthesia   . Essential hypertension   . GERD (gastroesophageal reflux disease)   . Glaucoma   . Hernia   . Hyperlipemia   . Myocardial infarction ([place])    NSTEMI 04/24/18 (40% D2 by LHC; Dx: Takotsubo CM vs coronary spasm vs myocarditis)  . PONV (postoperative nausea and vomiting)     Past Surgical History:  Procedure Laterality Date  . CARDIAC CATHETERIZATION  04/05/03  . CHOLECYSTECTOMY N/A 08/17/2018   Procedure: Laparoscopic Cholecystectomy;  Surgeon: Erroll Luna, MD;  Location: [place];  Service: General;  Laterality: N/A;  . KNEE ARTHROSCOPY  09/17/05   left  . LEFT HEART CATH AND CORONARY ANGIOGRAPHY N/A 04/26/2018   Procedure: LEFT HEART CATH AND CORONARY ANGIOGRAPHY;  Surgeon: Leonie Man, MD;  Location: [place] [facility];  Service: Cardiovascular;  Laterality: N/A;  . VAGINAL HYSTERECTOMY  1980's    There were no vitals filed for this visit.  Subjective Assessment - 03/07/19 1107    Subjective  I am massaging my abdominals more and it helps.    Currently in Pain?  No/denies    Multiple Pain Sites  No                       [facility] Adult PT Treatment/Exercise - 03/07/19 0001      Lumbar Exercises: Aerobic   Nustep  L1x 5 min warm up for PTA to discuss current status.       Lumbar Exercises: Standing   Shoulder Extension  Strengthening;Both;20 reps;Theraband   2 sets   Theraband Level (Shoulder Extension)  Level 2 (Red)    Other Standing Lumbar Exercises  Body Blade Bil UE ossilations with arms out in front 30 sec  2x, VC & TC for corrected technique, ebgage the core more    Other Standing Lumbar Exercises  Yelow band horizontal arm movements with static trunk 10x each side, TC for oblique isomettric contraction   PTA physically held pelvis and ribs still for no rotation     Lumbar Exercises: Supine   Bridge with clamshell  15 reps;1 second   with green band   Straight Leg Raise  10 reps   Bil, VC to contract abs            PT Education - 03/07/19 1148    Education Details  red band rows & extensions standing    Person(s) Educated  Patient    Methods  Explanation;Demonstration;Tactile cues;Verbal cues;Handout    Comprehension  Verbalized understanding;Returned demonstration       PT Short Term Goals - 01/25/19 1111      PT SHORT TERM  GOAL #1   Title  independent with initial HEP    Time  4    Period  Weeks    Status  Achieved      PT SHORT TERM GOAL #2   Title  able to contract the upper abdominals and lower abdominals equally    Time  4    Period  Weeks    Status  Achieved    Target Date  02/04/19      PT SHORT TERM GOAL #3   Title  abdominal pain decreased >/= 25% with activity    Time  4    Period  Weeks    Status  Achieved    Target Date  02/04/19        PT Long Term Goals - 03/01/19 1106      PT LONG TERM GOAL #1   Title  independent with HEP and understand how to progress correctly    Baseline  still learning    Time  8    Period  Weeks    Status  On-going      PT LONG TERM GOAL #2   Title  able to contract abdominals correctlywith lifting, carrying groceries, and transfers due to increased strength 3/5    Time  8    Period  Weeks    Status   On-going      PT LONG TERM GOAL #3   Title  abdominal pain pain with activities is minimal to no pain due to reduction of trigger points and increased strength    Baseline  2/10    Time  8    Period  Weeks    Status  On-going      PT LONG TERM GOAL #4   Title  walk for 6 minutes >/= 1278 feet due to reduction of fatique with tasks    Baseline  1310 feet    Time  8    Period  Weeks    Status  Achieved            Plan - 03/07/19 1106    Clinical Impression Statement  pt arrives today requesting to "watch" her knees as they hurt over the weekend. Today they feel better and in fact did fine throughout the session. Had pt try more difficult exercises that engage her core like Body Blade ossilations and anti-shear red band standing movements. Pt had palpable isometric contraction sof her obliques during the band exercise and palpable core engament during the body blade exercise. Pt reports she is massaging her abdomnals more frequently.    Personal Factors and Comorbidities  Age;Comorbidity 1;Comorbidity 2;Comorbidity 3+    Comorbidities  s/p Gall bladder removal on 08/13/2018, Myocardial infarction, Glaucoma    Examination-Activity Limitations  Lift;Carry;Bend    Examination-Participation Restrictions  Yard Work    Stability/Clinical Decision Making  Evolving/Moderate complexity    Rehab Potential  Excellent    PT Frequency  2x / week    PT Duration  8 weeks    PT Treatment/Interventions  Moist Heat;Cryotherapy;Electrical Stimulation;Therapeutic activities;Therapeutic exercise;Patient/family education;Neuromuscular re-education;Manual techniques;Dry needling;Taping    PT Next Visit Plan  Pt has 3 more scheduled appts.    PT Home Exercise Plan  Access Code: [email or web address]    Consulted and Agree with Plan of Care  Patient       Patient will benefit from skilled therapeutic intervention in order to improve the following deficits and impairments:  Decreased coordination, Increased fascial  restricitons, Increased muscle spasms, Decreased  activity tolerance, Pain, Decreased scar mobility, Decreased mobility, Decreased strength  Visit Diagnosis: Muscle weakness (generalized)  Other lack of coordination  Generalized abdominal pain     Problem List Patient Active Problem List   Diagnosis Date Noted  . Coronary artery disease involving native coronary artery of native heart without angina pectoris 08/14/2018  . Cardiomyopathy ([place])   . NSTEMI (non-ST elevated myocardial infarction) ([place]) 04/24/2018  . Essential hypertension 04/24/2018  . Hyperlipidemia with target LDL less than 70 04/24/2018  . Inguinal hernia 01/14/2011    Shawnita Krizek, PTA 03/07/2019, 11:49 AM  [facility] [address] [place], [place], [address] Phone: [phone]   Fax:  [phone]  Name: Julie Petty MRN: [id number] Date of Birth: 1931/12/23  Access Code: [facility]  URL: [email or web address]  Date: 03/07/2019  Prepared by: Myrene Galas   Exercises  Hooklying Isometric Hip Flexion - 5 reps - 1 sets - 5 sec hold - 2x daily - 7x weekly  Supine Transversus Abdominis Bracing - Hands on Stomach - 10 reps - 2x daily - 7x weekly  Supine Hip Adduction Isometric with Ball - 10 reps - 1 sets - 3 hold - 2x daily - 7x weekly  Supine Bridge with Mini Swiss Ball Between Knees - 10 reps - 1 sets - 1x daily - 7x weekly  Standing Row with Anchored Resistance - 10 reps - 2 sets - 1x daily - 7x weekly  Standing Elbow Extension with Anchored Resistance - 10 reps - 2 sets - 1x daily - 7x weekly  Patient Education  walking program

## 2019-03-14 ENCOUNTER — Ambulatory Visit: Payer: Medicare Other | Admitting: Physical Therapy

## 2019-03-21 ENCOUNTER — Encounter: Payer: Self-pay | Admitting: Physical Therapy

## 2019-03-21 ENCOUNTER — Other Ambulatory Visit: Payer: Self-pay

## 2019-03-21 ENCOUNTER — Ambulatory Visit: Payer: Medicare Other | Admitting: Physical Therapy

## 2019-03-21 DIAGNOSIS — M6281 Muscle weakness (generalized): Secondary | ICD-10-CM | POA: Diagnosis not present

## 2019-03-21 DIAGNOSIS — R278 Other lack of coordination: Secondary | ICD-10-CM

## 2019-03-21 DIAGNOSIS — R1084 Generalized abdominal pain: Secondary | ICD-10-CM

## 2019-03-21 NOTE — Therapy (Signed)
[facility] [address] [place], [place], [address] Phone: [phone]   Fax:  [phone]  Physical Therapy Treatment  Patient Details  Name: Julie Petty MRN: [id number] Date of Birth: March 01, 1932 Referring Provider (PT): Dr. Erroll Luna   Encounter Date: 03/21/2019  PT End of Session - 03/21/19 1104    Visit Number  12    Date for PT Re-Evaluation  04/01/19    Authorization Type  [employer]HC medicare    PT Start Time  1059    PT Stop Time  1140    PT Time Calculation (min)  41 min    Activity Tolerance  Patient tolerated treatment well    Behavior During Therapy  [facility] for tasks assessed/performed       Past Medical History:  Diagnosis Date  . Complication of anesthesia   . Essential hypertension   . GERD (gastroesophageal reflux disease)   . Glaucoma   . Hernia   . Hyperlipemia   . Myocardial infarction ([place])    NSTEMI 04/24/18 (40% D2 by LHC; Dx: Takotsubo CM vs coronary spasm vs myocarditis)  . PONV (postoperative nausea and vomiting)     Past Surgical History:  Procedure Laterality Date  . CARDIAC CATHETERIZATION  04/05/03  . CHOLECYSTECTOMY N/A 08/17/2018   Procedure: Laparoscopic Cholecystectomy;  Surgeon: Erroll Luna, MD;  Location: [place];  Service: General;  Laterality: N/A;  . KNEE ARTHROSCOPY  09/17/05   left  . LEFT HEART CATH AND CORONARY ANGIOGRAPHY N/A 04/26/2018   Procedure: LEFT HEART CATH AND CORONARY ANGIOGRAPHY;  Surgeon: Leonie Man, MD;  Location: [place] [facility];  Service: Cardiovascular;  Laterality: N/A;  . VAGINAL HYSTERECTOMY  1980's    There were no vitals filed for this visit.  Subjective Assessment - 03/21/19 1106    Subjective  I got down on my knees to mop the floor and it hurt a lot so went to see Dr Elmyra Ricks. He gave her a cortisone shot. He says I have calcium built up in my knees.    Currently in Pain?  Yes    Pain Score  7    Lt knee 7/10, Rt knee 3/10   Pain Location  Knee    Pain Orientation  Right;Left    Pain Descriptors / Indicators  Sore    Aggravating Factors   Over doing it    Pain Relieving Factors  Rubbing belly, my exercises    Multiple Pain Sites  No                       [facility] Adult PT Treatment/Exercise - 03/21/19 0001      Lumbar Exercises: Aerobic   Nustep  L1 x 4 min end of session      Lumbar Exercises: Supine   Glut Set  --   10x with ball for warm up, then bridge 10x   Bridge with clamshell  20 reps;1 second   with green band   Straight Leg Raise  20 reps   2x10, VC for core contraction     Manual Therapy   Manual Therapy  Myofascial release    Soft tissue mobilization  Lower abdomin, TA, proximal quads all BIl                PT Short Term Goals - 01/25/19 1111      PT SHORT TERM GOAL #1   Title  independent with initial HEP  Time  4    Period  Weeks    Status  Achieved      PT SHORT TERM GOAL #2   Title  able to contract the upper abdominals and lower abdominals equally    Time  4    Period  Weeks    Status  Achieved    Target Date  02/04/19      PT SHORT TERM GOAL #3   Title  abdominal pain decreased >/= 25% with activity    Time  4    Period  Weeks    Status  Achieved    Target Date  02/04/19        PT Long Term Goals - 03/01/19 1106      PT LONG TERM GOAL #1   Title  independent with HEP and understand how to progress correctly    Baseline  still learning    Time  8    Period  Weeks    Status  On-going      PT LONG TERM GOAL #2   Title  able to contract abdominals correctlywith lifting, carrying groceries, and transfers due to increased strength 3/5    Time  8    Period  Weeks    Status  On-going      PT LONG TERM GOAL #3   Title  abdominal pain pain with activities is minimal to no pain due to reduction of trigger points and increased strength    Baseline  2/10    Time  8    Period  Weeks    Status  On-going      PT LONG TERM GOAL #4   Title   walk for 6 minutes >/= 1278 feet due to reduction of fatique with tasks    Baseline  1310 feet    Time  8    Period  Weeks    Status  Achieved            Plan - 03/21/19 1105    Clinical Impression Statement  Pt presents post cortisone shots in Bil knees by Dr Elmyra Ricks. She reports kneeling with knee pads to mop her floor but getting up felt pretty straining to her knees. She reports the doctor sees calcium build up in her joints and they are inflammed. Pt may seek new order to work on her knees. Mild tightness felt along the proximal quads and soft tissue just RTR & LT of pubic bone.    Personal Factors and Comorbidities  Age;Comorbidity 1;Comorbidity 2;Comorbidity 3+    Comorbidities  s/p Gall bladder removal on 08/13/2018, Myocardial infarction, Glaucoma    Examination-Activity Limitations  Lift;Carry;Bend    Examination-Participation Restrictions  Yard Work    Stability/Clinical Decision Making  Evolving/Moderate complexity    Rehab Potential  Excellent    PT Frequency  2x / week    PT Duration  8 weeks    PT Treatment/Interventions  Moist Heat;Cryotherapy;Electrical Stimulation;Therapeutic activities;Therapeutic exercise;Patient/family education;Neuromuscular re-education;Manual techniques;Dry needling;Taping    PT Next Visit Plan  DC next session: review/finalize HEP    PT Home Exercise Plan  Access Code: [email or web address]    Consulted and Agree with Plan of Care  Patient       Patient will benefit from skilled therapeutic intervention in order to improve the following deficits and impairments:  Decreased coordination, Increased fascial restricitons, Increased muscle spasms, Decreased activity tolerance, Pain, Decreased scar mobility, Decreased mobility, Decreased strength  Visit Diagnosis: Muscle weakness (generalized)  Other lack  of coordination  Generalized abdominal pain     Problem List Patient Active Problem List   Diagnosis Date Noted  . Coronary artery disease  involving native coronary artery of native heart without angina pectoris 08/14/2018  . Cardiomyopathy ([place])   . NSTEMI (non-ST elevated myocardial infarction) ([place]) 04/24/2018  . Essential hypertension 04/24/2018  . Hyperlipidemia with target LDL less than 70 04/24/2018  . Inguinal hernia 01/14/2011    Kalima Saylor, PTA 03/21/2019, 11:41 AM  [facility] [address] [place], [place], [address] Phone: [phone]   Fax:  [phone]  Name: GIYANNA BALLO MRN: [id number] Date of Birth: 10/22/1931

## 2019-03-28 ENCOUNTER — Encounter: Payer: Medicare Other | Admitting: Physical Therapy

## 2019-03-29 ENCOUNTER — Other Ambulatory Visit: Payer: Self-pay

## 2019-03-29 ENCOUNTER — Ambulatory Visit: Payer: Medicare Other | Attending: Surgery | Admitting: Physical Therapy

## 2019-03-29 ENCOUNTER — Encounter: Payer: Self-pay | Admitting: Physical Therapy

## 2019-03-29 DIAGNOSIS — R1084 Generalized abdominal pain: Secondary | ICD-10-CM | POA: Diagnosis present

## 2019-03-29 DIAGNOSIS — M6281 Muscle weakness (generalized): Secondary | ICD-10-CM | POA: Diagnosis not present

## 2019-03-29 DIAGNOSIS — R278 Other lack of coordination: Secondary | ICD-10-CM | POA: Diagnosis present

## 2019-03-29 NOTE — Patient Instructions (Signed)
Access Code: [facility]  URL: [email or web address]  Date: 03/29/2019  Prepared by: Earlie Counts   Exercises Supine Transversus Abdominis Bracing - Hands on Stomach - 10 reps - 2x daily - 7x weekly Supine Bridge with Mini Swiss Ball Between Knees - 10 reps - 1 sets - 1x daily - 7x weekly Standing Row with Anchored Resistance - 10 reps - 2 sets - 1x daily - 7x weekly Shoulder extension with resistance - Neutral - 10 reps - 2 sets - 1x daily - 7x weekly Supine March - 10 reps - 1 sets - 1x daily - 7x weekly Bridge with Abduction and Resistance Loop - 10 reps - 2 sets - 1x daily - 7x weekly Straight Leg Raise - 10 reps - 1 sets - 1x daily - 7x weekly Patient Education walking program [facility] [address], [place],  [address] Phone # [phone] Fax [phone]

## 2019-03-29 NOTE — Therapy (Signed)
[facility] [address] [place], [place], [address] Phone: [phone]   Fax:  [phone]  Physical Therapy Treatment  Patient Details  Name: Julie Petty MRN: [id number] Date of Birth: 1932-03-19 Referring Provider (PT): Dr. Erroll Luna   Encounter Date: 03/29/2019  PT End of Session - 03/29/19 1231    Visit Number  13    Date for PT Re-Evaluation  04/01/19    Authorization Type  [employer]HC medicare    PT Start Time  1230    PT Stop Time  1300    PT Time Calculation (min)  30 min    Activity Tolerance  Patient tolerated treatment well    Behavior During Therapy  [facility] for tasks assessed/performed       Past Medical History:  Diagnosis Date  . Complication of anesthesia   . Essential hypertension   . GERD (gastroesophageal reflux disease)   . Glaucoma   . Hernia   . Hyperlipemia   . Myocardial infarction ([place])    NSTEMI 04/24/18 (40% D2 by LHC; Dx: Takotsubo CM vs coronary spasm vs myocarditis)  . PONV (postoperative nausea and vomiting)     Past Surgical History:  Procedure Laterality Date  . CARDIAC CATHETERIZATION  04/05/03  . CHOLECYSTECTOMY N/A 08/17/2018   Procedure: Laparoscopic Cholecystectomy;  Surgeon: Erroll Luna, MD;  Location: [place];  Service: General;  Laterality: N/A;  . KNEE ARTHROSCOPY  09/17/05   left  . LEFT HEART CATH AND CORONARY ANGIOGRAPHY N/A 04/26/2018   Procedure: LEFT HEART CATH AND CORONARY ANGIOGRAPHY;  Surgeon: Leonie Man, MD;  Location: [place] [facility];  Service: Cardiovascular;  Laterality: N/A;  . VAGINAL HYSTERECTOMY  1980's    There were no vitals filed for this visit.  Subjective Assessment - 03/29/19 1234    Subjective  I feel great. Pain is 95% better.    Patient Stated Goals  reduce pain    Currently in Pain?  Yes    Pain Score  2     Pain Location  Abdomen    Pain Orientation  Lower    Pain Descriptors / Indicators  Sore    Pain Type  Chronic  pain    Pain Onset  More than a month ago    Aggravating Factors   over doing it    Pain Relieving Factors  rubbing belly, my exercises    Multiple Pain Sites  No         [facility] PT Assessment - 03/29/19 0001      Assessment   Medical Diagnosis  S39.011A Abdominal Wall Strain    Referring Provider (PT)  Dr. Marcello Moores [place]    Onset Date/Surgical Date  08/13/18    Prior Therapy  none      Precautions   Precautions  None      Restrictions   Weight Bearing Restrictions  No      Home Film/video editor residence      Prior Function   Level of [place]  Retired      Associate Professor   Overall Cognitive Status  Within Functional Limits for tasks assessed      Posture/Postural Control   Posture/Postural Control  Postural limitations    Postural Limitations  Rounded Shoulders;Forward head      Strength   Overall Strength Comments  abdominal strength 3/5    Right Hip Extension  5/5  Right Hip ABduction  5/5    Left Hip Extension  5/5    Left Hip ABduction  5/5                   [facility] Adult PT Treatment/Exercise - 03/29/19 0001      Lumbar Exercises: Aerobic   Nustep  L1 x 6 min while assessing patient      Lumbar Exercises: Standing   Scapular Retraction  Strengthening;Both;10 reps;Theraband    Theraband Level (Scapular Retraction)  Level 3 (Green)    Shoulder Extension  Strengthening;Both;20 reps;Theraband   2 sets   Theraband Level (Shoulder Extension)  Level 2 (Red)      Lumbar Exercises: Supine   Bent Knee Raise  20 reps;1 second   abdominal bracing   Bridge with Ball Squeeze  10 reps;1 second    Bridge with clamshell  20 reps;1 second   with green band   Straight Leg Raise  20 reps   2x10, VC for core contraction            PT Education - 03/29/19 1251    Education Details  [place]    Person(s) Educated  Patient    Methods  Explanation;Demonstration;Verbal cues;Handout    Comprehension   Verbalized understanding;Returned demonstration       PT Short Term Goals - 03/29/19 1235      PT SHORT TERM GOAL #1   Title  independent with initial HEP    Time  4    Period  Weeks    Status  Achieved    Target Date  02/04/19      PT SHORT TERM GOAL #2   Title  able to contract the upper abdominals and lower abdominals equally    Time  4    Period  Weeks    Status  Achieved    Target Date  02/04/19      PT SHORT TERM GOAL #3   Title  abdominal pain decreased >/= 25% with activity    Time  4    Period  Weeks    Status  Achieved    Target Date  02/04/19        PT Long Term Goals - 03/29/19 1235      PT LONG TERM GOAL #1   Title  independent with HEP and understand how to progress correctly    Baseline  still learning    Time  8    Period  Weeks    Status  Achieved      PT LONG TERM GOAL #2   Title  able to contract abdominals correctlywith lifting, carrying groceries, and transfers due to increased strength 3/5    Time  8    Period  Weeks    Status  Achieved      PT LONG TERM GOAL #3   Title  abdominal pain pain with activities is minimal to no pain due to reduction of trigger points and increased strength    Time  8    Period  Weeks    Status  Achieved      PT LONG TERM GOAL #4   Title  walk for 6 minutes >/= 1278 feet due to reduction of fatique with tasks    Time  8    Period  Weeks    Status  Achieved            Plan - 03/29/19 1232    Clinical Impression Statement  Patient  reports her abdominal pain is 95% better. Patient has met all of her goals. Patient has increased abdominal and hip strength. Patient is able to lift items with good core contraction. Patient has improve abdomial fascial mobility. Patient is ready for discharge.    Personal Factors and Comorbidities  Age;Comorbidity 1;Comorbidity 2;Comorbidity 3+    Comorbidities  s/p Gall bladder removal on 08/13/2018, Myocardial infarction, Glaucoma    Examination-Activity Limitations   Lift;Carry;Bend    Examination-Participation Restrictions  Yard Work    Stability/Clinical Decision Making  Evolving/Moderate complexity    Rehab Potential  Excellent    PT Treatment/Interventions  Moist Heat;Cryotherapy;Electrical Stimulation;Therapeutic activities;Therapeutic exercise;Patient/family education;Neuromuscular re-education;Manual techniques;Dry needling;Taping    PT Next Visit Plan  Discharge to HEP this visit    PT Home Exercise Plan  Access Code: [facility]    Consulted and Agree with Plan of Care  Patient       Patient will benefit from skilled therapeutic intervention in order to improve the following deficits and impairments:  Decreased coordination, Increased fascial restricitons, Increased muscle spasms, Decreased activity tolerance, Pain, Decreased scar mobility, Decreased mobility, Decreased strength  Visit Diagnosis: Muscle weakness (generalized)  Other lack of coordination  Generalized abdominal pain     Problem List Patient Active Problem List   Diagnosis Date Noted  . Coronary artery disease involving native coronary artery of native heart without angina pectoris 08/14/2018  . Cardiomyopathy ([place])   . NSTEMI (non-ST elevated myocardial infarction) ([place]) 04/24/2018  . Essential hypertension 04/24/2018  . Hyperlipidemia with target LDL less than 70 04/24/2018  . Inguinal hernia 01/14/2011    Earlie Counts, PT 03/29/19 1:05 PM   [facility] [address] [place], [place], [address] Phone: [phone]   Fax:  [phone]  Name: Julie Petty MRN: [id number] Date of Birth: 01-May-1932  PHYSICAL THERAPY DISCHARGE SUMMARY  Visits from Start of Care: 13  Current functional level related to goals / functional outcomes: See above.   Remaining deficits: See above.    Education / Equipment: HEP Plan: Patient agrees to discharge.  Patient goals were met. Patient is being discharged  due to meeting the stated rehab goals.  Thank you for the referral. Earlie Counts, PT 03/29/19 1:06 PM  ?????

## 2019-04-18 ENCOUNTER — Encounter: Payer: Self-pay | Admitting: Physical Therapy

## 2019-04-18 ENCOUNTER — Ambulatory Visit: Payer: Medicare Other | Attending: Orthopedic Surgery | Admitting: Physical Therapy

## 2019-04-18 ENCOUNTER — Other Ambulatory Visit: Payer: Self-pay

## 2019-04-18 DIAGNOSIS — M25561 Pain in right knee: Secondary | ICD-10-CM | POA: Insufficient documentation

## 2019-04-18 DIAGNOSIS — M25562 Pain in left knee: Secondary | ICD-10-CM | POA: Diagnosis present

## 2019-04-18 DIAGNOSIS — M6281 Muscle weakness (generalized): Secondary | ICD-10-CM | POA: Diagnosis present

## 2019-04-18 NOTE — Patient Instructions (Signed)
Access Code: [facility]  URL: [email or web address]  Date: 04/18/2019  Prepared by: Earlie Counts   Straight Leg Raise - 10 reps - 1 sets - 1x daily - 7x weekly Long Sitting Quad Set - 10 reps - 1 sets - 5 sec hold - 1x daily - 7x weekly Supine Quad Set - 10 reps - 1 sets - 5 sec hold - 1x daily - 7x weekly Patient Education walking program Knee Osteoarthritis Managing Knee Pain [facility] [address] [place], [place] [address] Phone # [phone] Fax [phone]

## 2019-04-18 NOTE — Therapy (Signed)
[facility] [address], [place], [place], [address] Phone: [phone]   Fax:  [phone]  Physical Therapy Evaluation  Patient Details  Name: Julie Petty MRN: [id number] Date of Birth: 05-06-1932 Referring Provider (PT): Dr. Gaynelle Arabian   Encounter Date: 04/18/2019  PT End of Session - 04/18/19 1001    Visit Number  1    Date for PT Re-Evaluation  06/13/19    Authorization Type  [employer] medicare    PT Start Time  0930    PT Stop Time  1005    PT Time Calculation (min)  35 min    Activity Tolerance  Patient tolerated treatment well;No increased pain    Behavior During Therapy  WFL for tasks assessed/performed       Past Medical History:  Diagnosis Date  . Complication of anesthesia   . Essential hypertension   . GERD (gastroesophageal reflux disease)   . Glaucoma   . Hernia   . Hyperlipemia   . Myocardial infarction ([place])    NSTEMI 04/24/18 (40% D2 by LHC; Dx: Takotsubo CM vs coronary spasm vs myocarditis)  . PONV (postoperative nausea and vomiting)     Past Surgical History:  Procedure Laterality Date  . CARDIAC CATHETERIZATION  04/05/03  . CHOLECYSTECTOMY N/A 08/17/2018   Procedure: Laparoscopic Cholecystectomy;  Surgeon: Erroll Luna, MD;  Location: [facility];  Service: General;  Laterality: N/A;  . KNEE ARTHROSCOPY  09/17/05   left  . LEFT HEART CATH AND CORONARY ANGIOGRAPHY N/A 04/26/2018   Procedure: LEFT HEART CATH AND CORONARY ANGIOGRAPHY;  Surgeon: Leonie Man, MD;  Location: [place] [facility];  Service: Cardiovascular;  Laterality: N/A;  . VAGINAL HYSTERECTOMY  1980's    There were no vitals filed for this visit.   Subjective Assessment - 04/18/19 0934    Subjective  I had cortisone shot in bilateral knees.Patient reports her knees started to hurt her in the beginning of September. Knee pain started when she was kneeling to clean her floors.    Patient Stated Goals  reduce pain     Currently in Pain?  Yes    Pain Score  8    low 4/10   Pain Location  Knee    Pain Orientation  Right;Left    Pain Descriptors / Indicators  Aching;Burning    Pain Type  Acute pain    Pain Onset  More than a month ago    Pain Frequency  Intermittent    Aggravating Factors   walking too much, walk on uneven surfaces    Pain Relieving Factors  pain relieving gel    Multiple Pain Sites  No         [facility] PT Assessment - 04/18/19 0001      Assessment   Medical Diagnosis  M17.11 Unilateral primary osteoarthritis, right knee; bilateral knee pain    Referring Provider (PT)  Dr. Pilar Plate Aluisio    Onset Date/Surgical Date  02/22/19    Prior Therapy  none      Precautions   Precautions  None      Restrictions   Weight Bearing Restrictions  No      Balance Screen   Has the patient fallen in the past 6 months  No    Has the patient had a decrease in activity level because of a fear of falling?   No    Is the patient reluctant to leave their home because of a fear of  falling?   No      Home Film/video editor residence      Prior Function   Level of Independence  Independent    Vocation  Retired      Associate Professor   Overall Cognitive Status  Within Functional Limits for tasks assessed      Observation/Other Assessments   Focus on Therapeutic Outcomes (FOTO)   44% limitation; goal is 29% lmitation      Posture/Postural Control   Posture/Postural Control  No significant limitations      ROM / Strength   AROM / PROM / Strength  AROM;PROM;Strength      AROM   Overall AROM   Within functional limits for tasks performed   for bilateral knees     PROM   Overall PROM Comments  full left knee flexion with pain at endrange      Strength   Right Knee Flexion  4/5    Right Knee Extension  4/5    Left Knee Flexion  4/5    Left Knee Extension  4-/5      Palpation   Patella mobility  decreased mobility of bilateral knees and tender to touchl    SI assessment    pelvis in correct alignment    Palpation comment  bil. anterior knee joint, around the patella, puffiness on the medial knee joint                Objective measurements completed on examination: See above findings.      [facility] Adult PT Treatment/Exercise - 04/18/19 0001      Self-Care   Self-Care  Other Self-Care Comments    Other Self-Care Comments   education on knee osteoarthritis, why she has pain             PT Education - 04/18/19 1016    Education Details  Access Code: [facility]    Person(s) Educated  Patient    Methods  Explanation;Demonstration;Verbal cues;Handout    Comprehension  Returned demonstration;Verbalized understanding       PT Short Term Goals - 04/18/19 1118      PT SHORT TERM GOAL #1   Title  independent with initial HEP    Time  4    Period  Weeks    Status  New    Target Date  05/16/19      PT SHORT TERM GOAL #2   Title  knee pain with daily tasks decreased to moderate level due to improved mobility    Time  4    Period  Weeks    Status  New    Target Date  05/16/19      PT SHORT TERM GOAL #3   Title  ----        PT Long Term Goals - 04/18/19 1006      PT LONG TERM GOAL #1   Title  independent with HEP and understand how to progress correctly    Time  8    Period  Weeks    Status  New    Target Date  06/13/19      PT LONG TERM GOAL #2   Title  walking with less knee pain for 1 hour in the store due to improved flexibility and strength    Time  8    Period  Weeks    Status  New    Target Date  06/13/19      PT LONG  TERM GOAL #3   Title  understand ways to manage pain in bilateral knees with flexibility exericses, ice, and rest    Baseline  --    Time  8    Period  Weeks    Status  New    Target Date  06/13/19      PT LONG TERM GOAL #4   Title  FOTO score </= 29% limitation from 44% limitation    Baseline  ---    Time  8    Period  Weeks    Status  New    Target Date  06/13/19      PT LONG TERM GOAL #5    Title  knee pain with daily activities decreased to minimal to moderate due to increased overall function    Time  8    Period  Weeks    Status  New    Target Date  06/13/19             Plan - 04/18/19 1010    Clinical Impression Statement  Patient is a 85 year old female with acute bilateral knee pain when she was on her knees to clean her floor. Patient reports her knee pain ranges from 4-8/10. Patient has had cortisone shots from the doctor and has not seen a significant difference in her pain yet. Patient bilateral knee strength is 4/5 and she has full ROM with endrange pain. Patient has stiffness in bilateral patellas. Patient has tendenress located in bilateral knee joints, around patella, and has puffness on the medial knees. Patient needs to keep a rolled up towel under her knee with quad set due to pain. Patient will benefit from skilled therapy to improve bilateral knee strength and function with reducing pain and education on pain management.    Personal Factors and Comorbidities  Age;Comorbidity 1;Comorbidity 2;Comorbidity 3+    Comorbidities  s/p Gall bladder removal on 08/13/2018, Myocardial infarction, Glaucoma    Examination-Activity Limitations  Stairs;Stand;Squat;Locomotion Level    Examination-Participation Restrictions  Yard Work;Cleaning;Community Activity    Stability/Clinical Decision Making  Evolving/Moderate complexity    Clinical Decision Making  Moderate    Rehab Potential  Excellent    PT Frequency  2x / week    PT Duration  8 weeks    PT Treatment/Interventions  Cryotherapy;Electrical Stimulation;Iontophoresis 4mg /ml Dexamethasone;Moist Heat;Ultrasound;Therapeutic exercise;Therapeutic activities;Functional mobility training;Stair training;Neuromuscular re-education;Patient/family education;Gait training;Dry needling;Passive range of motion;Manual techniques;Taping    PT Next Visit Plan  next visit will be KX modifier due to having therapy in the past for  different diagnosis; bil. patella mobilization; ionto on medial knee for swelling if MD signs note, hamstring, gastroc, and quad stretch, bil. knee strength    PT Home Exercise Plan  Access Code: [facility]    Recommended Other Services  sent initial note for knees on 04/18/2019    Consulted and Agree with Plan of Care  Patient       Patient will benefit from skilled therapeutic intervention in order to improve the following deficits and impairments:  Difficulty walking, Decreased endurance, Decreased activity tolerance, Pain, Impaired flexibility, Decreased strength  Visit Diagnosis: Acute pain of right knee - Plan: PT plan of care cert/re-cert  Acute pain of left knee - Plan: PT plan of care cert/re-cert  Muscle weakness (generalized) - Plan: PT plan of care cert/re-cert     Problem List Patient Active Problem List   Diagnosis Date Noted  . Coronary artery disease involving native coronary artery of native heart without  angina pectoris 08/14/2018  . Cardiomyopathy ([place])   . NSTEMI (non-ST elevated myocardial infarction) ([place]) 04/24/2018  . Essential hypertension 04/24/2018  . Hyperlipidemia with target LDL less than 70 04/24/2018  . Inguinal hernia 01/14/2011    Earlie Counts, PT 04/18/19 11:21 AM   [facility] [address] [place], [place], [address] Phone: [phone]   Fax:  [phone]  Name: LEDA MCRORIE MRN: [id number] Date of Birth: March 24, 1932

## 2019-04-21 ENCOUNTER — Ambulatory Visit: Payer: Medicare Other

## 2019-04-25 ENCOUNTER — Other Ambulatory Visit: Payer: Self-pay

## 2019-04-25 ENCOUNTER — Ambulatory Visit: Payer: Medicare Other | Attending: Orthopedic Surgery | Admitting: Physical Therapy

## 2019-04-25 ENCOUNTER — Encounter: Payer: Self-pay | Admitting: Physical Therapy

## 2019-04-25 DIAGNOSIS — M25561 Pain in right knee: Secondary | ICD-10-CM | POA: Diagnosis not present

## 2019-04-25 DIAGNOSIS — M25562 Pain in left knee: Secondary | ICD-10-CM

## 2019-04-25 DIAGNOSIS — M6281 Muscle weakness (generalized): Secondary | ICD-10-CM | POA: Diagnosis present

## 2019-04-25 NOTE — Therapy (Signed)
[facility] [address] [place], [place], [address] Phone: [phone]   Fax:  [phone]  Physical Therapy Treatment  Patient Details  Name: Julie Petty MRN: [id number] Date of Birth: 1931/07/01 Referring Provider (PT): Dr. Gaynelle Arabian   Encounter Date: 04/25/2019  PT End of Session - 04/25/19 1401    Visit Number  2    Date for PT Re-Evaluation  06/13/19    Authorization Type  [employer]HC medicare    PT Start Time  1400    PT Stop Time  1438    PT Time Calculation (min)  38 min    Activity Tolerance  Patient tolerated treatment well    Behavior During Therapy  [facility] for tasks assessed/performed       Past Medical History:  Diagnosis Date  . Complication of anesthesia   . Essential hypertension   . GERD (gastroesophageal reflux disease)   . Glaucoma   . Hernia   . Hyperlipemia   . Myocardial infarction ([place])    NSTEMI 04/24/18 (40% D2 by LHC; Dx: Takotsubo CM vs coronary spasm vs myocarditis)  . PONV (postoperative nausea and vomiting)     Past Surgical History:  Procedure Laterality Date  . CARDIAC CATHETERIZATION  04/05/03  . CHOLECYSTECTOMY N/A 08/17/2018   Procedure: Laparoscopic Cholecystectomy;  Surgeon: Erroll Luna, MD;  Location: [place] [facility];  Service: General;  Laterality: N/A;  . KNEE ARTHROSCOPY  09/17/05   left  . LEFT HEART CATH AND CORONARY ANGIOGRAPHY N/A 04/26/2018   Procedure: LEFT HEART CATH AND CORONARY ANGIOGRAPHY;  Surgeon: Leonie Man, MD;  Location: [place] [facility];  Service: Cardiovascular;  Laterality: N/A;  . VAGINAL HYSTERECTOMY  1980's    There were no vitals filed for this visit.  Subjective Assessment - 04/25/19 1403    Subjective  Lt knee hurts more than RT. Night time is worse.    Currently in Pain?  Yes    Pain Score  --   RT 3/10, LT 5/10   Pain Location  Knee    Pain Descriptors / Indicators  Aching    Aggravating Factors   Walking too much    Pain  Relieving Factors  Topical can help    Multiple Pain Sites  No                       [facility] Adult PT Treatment/Exercise - 04/25/19 0001      Self-Care   Self-Care  Heat/Ice Application    Other Self-Care Comments   use of ice on knees after her walking      Lumbar Exercises: Aerobic   Nustep  L1x 6 min with concurrent review      Knee/Hip Exercises: Seated   Long Arc Quad  Strengthening;Both;1 set;10 reps;Weights    Long Arc Quad Weight  2 lbs.    Long [employer] Limitations  ball squeeze first      Knee/Hip Exercises: Supine   Short Arc [employer]  Strengthening;Both;1 set;10 reps    Short Arc [employer] Limitations  added 2#, VC to contract core first    Straight Leg Raises  Strengthening;Both;2 sets;10 reps    Straight Leg Raises Limitations  VC to initiate with ab set then lift leg      Modalities   Modalities  Iontophoresis      Iontophoresis   Type of Iontophoresis  Dexamethasone    Location  Bil medial knee joint  Dose  1 ml     Time  6 hr wear skin intact             PT Education - 04/25/19 1422    Education Details  [employer] info    Person(s) Educated  Patient    Methods  Handout    Comprehension  Verbalized understanding       PT Short Term Goals - 04/18/19 1118      PT SHORT TERM GOAL #1   Title  independent with initial HEP    Time  4    Period  Weeks    Status  New    Target Date  05/16/19      PT SHORT TERM GOAL #2   Title  knee pain with daily tasks decreased to moderate level due to improved mobility    Time  4    Period  Weeks    Status  New    Target Date  05/16/19      PT SHORT TERM GOAL #3   Title  ----        PT Long Term Goals - 04/18/19 1006      PT LONG TERM GOAL #1   Title  independent with HEP and understand how to progress correctly    Time  8    Period  Weeks    Status  New    Target Date  06/13/19      PT LONG TERM GOAL #2   Title  walking with less knee pain for 1 hour in the store due to improved  flexibility and strength    Time  8    Period  Weeks    Status  New    Target Date  06/13/19      PT LONG TERM GOAL #3   Title  understand ways to manage pain in bilateral knees with flexibility exericses, ice, and rest    Baseline  --    Time  8    Period  Weeks    Status  New    Target Date  06/13/19      PT LONG TERM GOAL #4   Title  FOTO score </= 29% limitation from 44% limitation    Baseline  ---    Time  8    Period  Weeks    Status  New    Target Date  06/13/19      PT LONG TERM GOAL #5   Title  knee pain with daily activities decreased to minimal to moderate due to increased overall function    Time  8    Period  Weeks    Status  New    Target Date  06/13/19            Plan - 04/25/19 1405    Clinical Impression Statement  Pt independent and compliant with HEP. Pt does not have the best quad set but she is getting better with that. Pt was able to tolereate some light ankle weights for open chain exercises  ( no pain ) and we initiated the ionto patch for bil medial joint line pain and swelling. Pt was educated on precautions and contraindications.    Personal Factors and Comorbidities  Age;Comorbidity 1;Comorbidity 2;Comorbidity 3+    Comorbidities  s/p Gall bladder removal on 08/13/2018, Myocardial infarction, Glaucoma    Examination-Activity Limitations  Stairs;Stand;Squat;Locomotion Level    Examination-Participation Restrictions  Yard Work;Cleaning;Community Activity    Stability/Clinical Decision Making  Evolving/Moderate complexity    Rehab Potential  Excellent    PT Frequency  2x / week    PT Duration  8 weeks    PT Treatment/Interventions  Cryotherapy;Electrical Stimulation;Iontophoresis 4mg /ml Dexamethasone;Moist Heat;Ultrasound;Therapeutic exercise;Therapeutic activities;Functional mobility training;Stair training;Neuromuscular re-education;Patient/family education;Gait training;Dry needling;Passive range of motion;Manual techniques;Taping    PT Next  Visit Plan  See how patches did, continue with #2: add stretches to HEP and continue to build quad strength    PT Home Exercise Plan  Access Code: [email or web address]    Consulted and Agree with Plan of Care  Patient       Patient will benefit from skilled therapeutic intervention in order to improve the following deficits and impairments:  Difficulty walking, Decreased endurance, Decreased activity tolerance, Pain, Impaired flexibility, Decreased strength  Visit Diagnosis: Acute pain of right knee  Acute pain of left knee  Muscle weakness (generalized)     Problem List Patient Active Problem List   Diagnosis Date Noted  . Coronary artery disease involving native coronary artery of native heart without angina pectoris 08/14/2018  . Cardiomyopathy ([place])   . NSTEMI (non-ST elevated myocardial infarction) ([place]) 04/24/2018  . Essential hypertension 04/24/2018  . Hyperlipidemia with target LDL less than 70 04/24/2018  . Inguinal hernia 01/14/2011    Taraneh Metheney, PTA 04/25/2019, 2:44 PM  [facility] [address] [place], [place], [address] Phone: [phone]   Fax:  [phone]  Name: Julie Petty MRN: [id number] Date of Birth: [birth year]

## 2019-04-25 NOTE — Patient Instructions (Signed)

## 2019-04-28 ENCOUNTER — Other Ambulatory Visit: Payer: Self-pay

## 2019-04-28 ENCOUNTER — Ambulatory Visit: Payer: Medicare Other | Admitting: Physical Therapy

## 2019-04-28 ENCOUNTER — Encounter: Payer: Self-pay | Admitting: Physical Therapy

## 2019-04-28 DIAGNOSIS — M25561 Pain in right knee: Secondary | ICD-10-CM | POA: Diagnosis not present

## 2019-04-28 DIAGNOSIS — M25562 Pain in left knee: Secondary | ICD-10-CM

## 2019-04-28 DIAGNOSIS — M6281 Muscle weakness (generalized): Secondary | ICD-10-CM

## 2019-04-28 NOTE — Therapy (Signed)
[facility] [address] [place], [place], [address] Phone: [phone]   Fax:  [phone]  Physical Therapy Treatment  Patient Details  Name: Julie Petty MRN: [id number] Date of Birth: 10/18/1931 Referring Provider (PT): Dr. Gaynelle Arabian   Encounter Date: 04/28/2019  PT End of Session - 04/28/19 1107    Visit Number  3    Date for PT Re-Evaluation  06/13/19    Authorization Type  [employer]HC medicare    PT Start Time  1100    PT Stop Time  1140    PT Time Calculation (min)  40 min    Activity Tolerance  Patient tolerated treatment well    Behavior During Therapy  [facility] for tasks assessed/performed       Past Medical History:  Diagnosis Date  . Complication of anesthesia   . Essential hypertension   . GERD (gastroesophageal reflux disease)   . Glaucoma   . Hernia   . Hyperlipemia   . Myocardial infarction ([place])    NSTEMI 04/24/18 (40% D2 by LHC; Dx: Takotsubo CM vs coronary spasm vs myocarditis)  . PONV (postoperative nausea and vomiting)     Past Surgical History:  Procedure Laterality Date  . CARDIAC CATHETERIZATION  04/05/03  . CHOLECYSTECTOMY N/A 08/17/2018   Procedure: Laparoscopic Cholecystectomy;  Surgeon: Erroll Luna, MD;  Location: [place];  Service: General;  Laterality: N/A;  . KNEE ARTHROSCOPY  09/17/05   left  . LEFT HEART CATH AND CORONARY ANGIOGRAPHY N/A 04/26/2018   Procedure: LEFT HEART CATH AND CORONARY ANGIOGRAPHY;  Surgeon: Leonie Man, MD;  Location: [place] [facility];  Service: Cardiovascular;  Laterality: N/A;  . VAGINAL HYSTERECTOMY  1980's    There were no vitals filed for this visit.  Subjective Assessment - 04/28/19 1105    Subjective  The ionto patches helped alot. Yesterday I ran errands without pain. I have pain in the morning.    Patient Stated Goals  reduce pain    Currently in Pain?  Yes    Pain Score  4    left is 2/4; right is 2/5   Pain Location  Knee    Pain Orientation  Right;Left    Pain Descriptors / Indicators  Aching    Pain Type  Acute pain    Pain Onset  More than a month ago    Pain Frequency  Intermittent    Aggravating Factors   walking too much, in the morning    Pain Relieving Factors  movement during the day; topical can help    Multiple Pain Sites  No         [facility] PT Assessment - 04/28/19 0001      Strength   Right Knee Flexion  4/5    Right Knee Extension  4/5    Left Knee Flexion  4/5    Left Knee Extension  4-/5                   [facility] Adult PT Treatment/Exercise - 04/28/19 0001      Lumbar Exercises: Aerobic   Nustep  L1x 6 min with concurrent review      Knee/Hip Exercises: Stretches   Active Hamstring Stretch  Right;Left;1 rep;30 seconds   sitting   Gastroc Stretch  Right;Left;1 rep;30 seconds   stand with wall     Knee/Hip Exercises: Seated   Long Arc Quad  Strengthening;Both;1 set;10 reps;Weights      Knee/Hip Exercises:  Supine   Quad Sets  Strengthening;Right;Left;1 set;10 reps   hold 5 sec   Short Arc Quad Sets  Strengthening;Both;1 set;10 reps   2.5#   Heel Slides  Strengthening;Right;Left;1 set;10 reps   with abdominal bracing   Bridges  Strengthening;Both;1 set;10 reps   hold 5 sec     Modalities   Modalities  Iontophoresis      Iontophoresis   Type of Iontophoresis  Dexamethasone    Location  Bil medial knee joint    Dose  1 ml    #2   Time  6 hr wear skin intact             PT Education - 04/28/19 1143    Education Details  Access Code: [facility]    Person(s) Educated  Patient    Methods  Explanation;Demonstration;Verbal cues;Handout    Comprehension  Returned demonstration;Verbalized understanding       PT Short Term Goals - 04/28/19 1146      PT SHORT TERM GOAL #1   Title  independent with initial HEP    Time  4    Period  Weeks    Status  Achieved    Target Date  05/16/19      PT SHORT TERM GOAL #2   Title  knee pain with daily tasks  decreased to moderate level due to improved mobility    Time  4    Period  Weeks    Status  On-going    Target Date  05/16/19        PT Long Term Goals - 04/18/19 1006      PT LONG TERM GOAL #1   Title  independent with HEP and understand how to progress correctly    Time  8    Period  Weeks    Status  New    Target Date  06/13/19      PT LONG TERM GOAL #2   Title  walking with less knee pain for 1 hour in the store due to improved flexibility and strength    Time  8    Period  Weeks    Status  New    Target Date  06/13/19      PT LONG TERM GOAL #3   Title  understand ways to manage pain in bilateral knees with flexibility exericses, ice, and rest    Baseline  --    Time  8    Period  Weeks    Status  New    Target Date  06/13/19      PT LONG TERM GOAL #4   Title  FOTO score </= 29% limitation from 44% limitation    Baseline  ---    Time  8    Period  Weeks    Status  New    Target Date  06/13/19      PT LONG TERM GOAL #5   Title  knee pain with daily activities decreased to minimal to moderate due to increased overall function    Time  8    Period  Weeks    Status  New    Target Date  06/13/19            Plan - 04/28/19 1107    Clinical Impression Statement  Patient able to have 1 day without pain. Patient has less swelling in the knees. Patient was able to increase weights on the ankles. Patient is walking with greater ease. Patient HEP was  updated. Patient will benefit from skilled therapy to improve bilateral knee strength while reducing pain to improve functionl.    Personal Factors and Comorbidities  Age;Comorbidity 1;Comorbidity 2;Comorbidity 3+    Comorbidities  s/p Gall bladder removal on 08/13/2018, Myocardial infarction, Glaucoma    Examination-Activity Limitations  Stairs;Stand;Squat;Locomotion Level    Examination-Participation Restrictions  Yard Work;Cleaning;Community Activity    Stability/Clinical Decision Making  Evolving/Moderate  complexity    Rehab Potential  Excellent    PT Frequency  2x / week    PT Duration  8 weeks    PT Treatment/Interventions  Cryotherapy;Electrical Stimulation;Iontophoresis 4mg /ml Dexamethasone;Moist Heat;Ultrasound;Therapeutic exercise;Therapeutic activities;Functional mobility training;Stair training;Neuromuscular re-education;Patient/family education;Gait training;Dry needling;Passive range of motion;Manual techniques;Taping    PT Next Visit Plan  continue with #3 ionto; continue to build quad strength    PT Home Exercise Plan  Access Code: [employer] [employer]    Recommended Other Services  MD signed initial note    Consulted and Agree with Plan of Care  Patient       Patient will benefit from skilled therapeutic intervention in order to improve the following deficits and impairments:  Difficulty walking, Decreased endurance, Decreased activity tolerance, Pain, Impaired flexibility, Decreased strength  Visit Diagnosis: Acute pain of right knee  Acute pain of left knee  Muscle weakness (generalized)     Problem List Patient Active Problem List   Diagnosis Date Noted  . Coronary artery disease involving native coronary artery of native heart without angina pectoris 08/14/2018  . Cardiomyopathy ([place])   . NSTEMI (non-ST elevated myocardial infarction) ([place]) 04/24/2018  . Essential hypertension 04/24/2018  . Hyperlipidemia with target LDL less than 70 04/24/2018  . Inguinal hernia 01/14/2011    Earlie Counts, PT 04/28/19 11:47 AM   [facility] [address], [place], [place], [address] Phone: [phone]   Fax:  [phone]  Name: Julie Petty MRN: [id number] Date of Birth: Jan 20, 1932

## 2019-04-28 NOTE — Patient Instructions (Signed)
Access Code: [facility]  URL: [email or web address]  Date: 04/28/2019  Prepared by: Earlie Counts   Exercises Supine Transversus Abdominis Bracing - Hands on Stomach - 10 reps - 2x daily - 7x weekly Standing Row with Anchored Resistance - 10 reps - 2 sets - 1x daily - 7x weekly Shoulder extension with resistance - Neutral - 10 reps - 2 sets - 1x daily - 7x weekly Supine March - 10 reps - 1 sets - 1x daily - 7x weekly Bridge with Abduction and Resistance Loop - 10 reps - 2 sets - 1x daily - 7x weekly Straight Leg Raise - 10 reps - 1 sets - 1x daily - 7x weekly Long Sitting Quad Set - 10 reps - 1 sets - 5 sec hold - 1x daily - 7x weekly Supine Quad Set - 10 reps - 1 sets - 5 sec hold - 1x daily - 7x weekly Seated Hamstring Stretch - 2 reps - 1 sets - 30 sec hold - 1x daily - 7x weekly Gastroc Stretch on Wall - 2 reps - 1 sets - 30 sec hold - 1x daily - 7x weekly Supine Bridge - 10 reps - 1 sets - 5 sec hold - 1x daily - 7x weekly Supine Heel Slides - 10 reps - 1 sets - 1x daily - 7x weekly Clamshell - 10 reps - 1 sets - 1x daily - 7x weekly Patient Education walking program Knee Osteoarthritis Managing Knee Pain [facility] [address] [place], [place] [address] Phone # [phone] Fax [phone]

## 2019-04-29 ENCOUNTER — Other Ambulatory Visit: Payer: Self-pay | Admitting: Physician Assistant

## 2019-05-03 ENCOUNTER — Ambulatory Visit: Payer: Medicare Other | Admitting: Physical Therapy

## 2019-05-03 ENCOUNTER — Other Ambulatory Visit: Payer: Self-pay

## 2019-05-03 ENCOUNTER — Encounter: Payer: Self-pay | Admitting: Physical Therapy

## 2019-05-03 DIAGNOSIS — M6281 Muscle weakness (generalized): Secondary | ICD-10-CM

## 2019-05-03 DIAGNOSIS — M25561 Pain in right knee: Secondary | ICD-10-CM | POA: Diagnosis not present

## 2019-05-03 DIAGNOSIS — M25562 Pain in left knee: Secondary | ICD-10-CM

## 2019-05-03 NOTE — Therapy (Signed)
[facility] [address] [place], [place], [address] Phone: [phone]   Fax:  [phone]  Physical Therapy Treatment  Patient Details  Name: Julie Petty MRN: [id number] Date of Birth: Dec 25, 1931 Referring Provider (PT): Dr. Gaynelle Arabian   Encounter Date: 05/03/2019  PT End of Session - 05/03/19 1612    Visit Number  4    Date for PT Re-Evaluation  06/13/19    Authorization Type  [employer]HC medicare    PT Start Time  [id number]    PT Stop Time  1612    PT Time Calculation (min)  42 min    Activity Tolerance  Patient tolerated treatment well;No increased pain    Behavior During Therapy  WFL for tasks assessed/performed       Past Medical History:  Diagnosis Date  . Complication of anesthesia   . Essential hypertension   . GERD (gastroesophageal reflux disease)   . Glaucoma   . Hernia   . Hyperlipemia   . Myocardial infarction ([place])    NSTEMI 04/24/18 (40% D2 by LHC; Dx: Takotsubo CM vs coronary spasm vs myocarditis)  . PONV (postoperative nausea and vomiting)     Past Surgical History:  Procedure Laterality Date  . CARDIAC CATHETERIZATION  04/05/03  . CHOLECYSTECTOMY N/A 08/17/2018   Procedure: Laparoscopic Cholecystectomy;  Surgeon: Erroll Luna, MD;  Location: [place];  Service: General;  Laterality: N/A;  . KNEE ARTHROSCOPY  09/17/05   left  . LEFT HEART CATH AND CORONARY ANGIOGRAPHY N/A 04/26/2018   Procedure: LEFT HEART CATH AND CORONARY ANGIOGRAPHY;  Surgeon: Leonie Man, MD;  Location: [place] [facility];  Service: Cardiovascular;  Laterality: N/A;  . VAGINAL HYSTERECTOMY  1980's    There were no vitals filed for this visit.  Subjective Assessment - 05/03/19 1534    Subjective  I raked leaves to put in a container over the weekend and my knees have been hurting. 5:30 AM I wake up to go to the bathroom and they hurt alot.    Patient Stated Goals  reduce pain    Currently in Pain?  Yes    Pain  Score  5    right is 0/10   Pain Location  Knee    Pain Orientation  Right;Left    Pain Descriptors / Indicators  Aching    Pain Type  Acute pain    Pain Onset  More than a month ago    Pain Frequency  Intermittent    Aggravating Factors   walking too much, in the morning    Multiple Pain Sites  No                       [facility] Adult PT Treatment/Exercise - 05/03/19 0001      Lumbar Exercises: Aerobic   Nustep  L1x 9 min with concurrent review      Knee/Hip Exercises: Standing   Lateral Step Up  Right;1 set;10 reps;Step Height: 4";Hand Hold: 2    Lateral Step Up Limitations  left 1 set of 10 on 2 inch step monitoring for knee pain    Forward Step Up  Right;Left;1 set;10 reps;Step Height: 4";Hand Hold: 1    Other Standing Knee Exercises  stand on flat side of BOSU ball to keep balance and only using hands when needed      Knee/Hip Exercises: Supine   Short Arc Quad Sets  Strengthening;Both;1 set;10 reps   2.5#  Short Arc [employer] Limitations  right 3#, left 2.5#    Heel Slides  Strengthening;Right;Left;10 reps;2 sets   with abdominal bracing   Heel Slides Limitations  left 2.5#, right 3#    Single Leg Bridge  Strengthening;Right;Left;1 set;5 reps      Iontophoresis   Type of Iontophoresis  Dexamethasone    Location  left  medial knee joint    Dose  1 ml    #2   Time  6 hr wear skin intact      Manual Therapy   Manual Therapy  Soft tissue mobilization;Joint mobilization    Joint Mobilization  left patella mobilization for inferior/superior and medial/lateral glide    Soft tissue mobilization  soft tissue work on the medial left knee and left patella tendon               PT Short Term Goals - 04/28/19 1146      PT SHORT TERM GOAL #1   Title  independent with initial HEP    Time  4    Period  Weeks    Status  Achieved    Target Date  05/16/19      PT SHORT TERM GOAL #2   Title  knee pain with daily tasks decreased to moderate level due to  improved mobility    Time  4    Period  Weeks    Status  On-going    Target Date  05/16/19        PT Long Term Goals - 04/18/19 1006      PT LONG TERM GOAL #1   Title  independent with HEP and understand how to progress correctly    Time  8    Period  Weeks    Status  New    Target Date  06/13/19      PT LONG TERM GOAL #2   Title  walking with less knee pain for 1 hour in the store due to improved flexibility and strength    Time  8    Period  Weeks    Status  New    Target Date  06/13/19      PT LONG TERM GOAL #3   Title  understand ways to manage pain in bilateral knees with flexibility exericses, ice, and rest    Baseline  --    Time  8    Period  Weeks    Status  New    Target Date  06/13/19      PT LONG TERM GOAL #4   Title  FOTO score </= 29% limitation from 44% limitation    Baseline  ---    Time  8    Period  Weeks    Status  New    Target Date  06/13/19      PT LONG TERM GOAL #5   Title  knee pain with daily activities decreased to minimal to moderate due to increased overall function    Time  8    Period  Weeks    Status  New    Target Date  06/13/19            Plan - 05/03/19 1613    Clinical Impression Statement  Patient raked the leaves and had increased bilateral knee pain. The left one today is 5/10 but the right one was 0/10 so she did not get ionto on the right. Patieng had slight pain in left after the step ups.  Patient felt better with soft tissue work to the medial left knee. Patient is getting stronger. Patient has tightness in the left patella tendon. Patient will benefit from skilled therapy to improve bilateral knee strength while reducing pain and improve function.    Personal Factors and Comorbidities  Age;Comorbidity 1;Comorbidity 2;Comorbidity 3+    Comorbidities  s/p Gall bladder removal on 08/13/2018, Myocardial infarction, Glaucoma    Examination-Activity Limitations  Stairs;Stand;Squat;Locomotion Level     Examination-Participation Restrictions  Yard Work;Cleaning;Community Activity    Stability/Clinical Decision Making  Evolving/Moderate complexity    Rehab Potential  Excellent    PT Frequency  2x / week    PT Duration  8 weeks    PT Treatment/Interventions  Cryotherapy;Electrical Stimulation;Iontophoresis 4mg /ml Dexamethasone;Moist Heat;Ultrasound;Therapeutic exercise;Therapeutic activities;Functional mobility training;Stair training;Neuromuscular re-education;Patient/family education;Gait training;Dry needling;Passive range of motion;Manual techniques;Taping    PT Next Visit Plan  continue with #3 ionto on right if needed and #4 on left; continue to build quad strength; check hip strength;    PT Home Exercise Plan  Access Code: [facility]    Consulted and Agree with Plan of Care  Patient       Patient will benefit from skilled therapeutic intervention in order to improve the following deficits and impairments:     Visit Diagnosis: Acute pain of right knee  Acute pain of left knee  Muscle weakness (generalized)     Problem List Patient Active Problem List   Diagnosis Date Noted  . Coronary artery disease involving native coronary artery of native heart without angina pectoris 08/14/2018  . Cardiomyopathy ([place])   . NSTEMI (non-ST elevated myocardial infarction) ([place]) 04/24/2018  . Essential hypertension 04/24/2018  . Hyperlipidemia with target LDL less than 70 04/24/2018  . Inguinal hernia 01/14/2011    Earlie Counts, PT 05/03/19 4:17 PM   [facility] [address] [place], [place], [address] Phone: [phone]   Fax:  [phone]  Name: ADALEYA MCDANIELS MRN: [id number] Date of Birth: 02/17/1932

## 2019-05-05 ENCOUNTER — Other Ambulatory Visit: Payer: Self-pay

## 2019-05-05 ENCOUNTER — Ambulatory Visit: Payer: Medicare Other

## 2019-05-05 DIAGNOSIS — M25561 Pain in right knee: Secondary | ICD-10-CM

## 2019-05-05 DIAGNOSIS — M25562 Pain in left knee: Secondary | ICD-10-CM

## 2019-05-05 DIAGNOSIS — M6281 Muscle weakness (generalized): Secondary | ICD-10-CM

## 2019-05-05 NOTE — Patient Instructions (Signed)

## 2019-05-05 NOTE — Therapy (Signed)
[facility] [address] [place], [place], [address] Phone: [phone]   Fax:  [phone]  Physical Therapy Treatment  Patient Details  Name: Julie Petty MRN: [id number] Date of Birth: [birth year] Referring Provider (PT): Dr. Gaynelle Arabian   Encounter Date: 05/05/2019  PT End of Session - 05/05/19 1058    Visit Number  5    Date for PT Re-Evaluation  06/13/19    Authorization Type  [employer]HC medicare    PT Start Time  [id number]    PT Stop Time  1109    PT Time Calculation (min)  52 min    Activity Tolerance  Patient tolerated treatment well;No increased pain    Behavior During Therapy  WFL for tasks assessed/performed       Past Medical History:  Diagnosis Date  . Complication of anesthesia   . Essential hypertension   . GERD (gastroesophageal reflux disease)   . Glaucoma   . Hernia   . Hyperlipemia   . Myocardial infarction ([place])    NSTEMI 04/24/18 (40% D2 by LHC; Dx: Takotsubo CM vs coronary spasm vs myocarditis)  . PONV (postoperative nausea and vomiting)     Past Surgical History:  Procedure Laterality Date  . CARDIAC CATHETERIZATION  04/05/03  . CHOLECYSTECTOMY N/A 08/17/2018   Procedure: Laparoscopic Cholecystectomy;  Surgeon: Erroll Luna, MD;  Location: [place];  Service: General;  Laterality: N/A;  . KNEE ARTHROSCOPY  09/17/05   left  . LEFT HEART CATH AND CORONARY ANGIOGRAPHY N/A 04/26/2018   Procedure: LEFT HEART CATH AND CORONARY ANGIOGRAPHY;  Surgeon: Leonie Man, MD;  Location: [place] [facility];  Service: Cardiovascular;  Laterality: N/A;  . VAGINAL HYSTERECTOMY  1980's    There were no vitals filed for this visit.  Subjective Assessment - 05/05/19 1021    Subjective  I am hurting more today because of the weather.    Currently in Pain?  Yes    Pain Score  5    Rt one is 4/10   Pain Location  Knee    Pain Orientation  Right;Left    Pain Descriptors / Indicators  Aching    Pain  Type  Acute pain    Pain Onset  More than a month ago    Aggravating Factors   weather changes, walking, in the morning    Pain Relieving Factors  movement during the day                       [facility] Adult PT Treatment/Exercise - 05/05/19 0001      Lumbar Exercises: Aerobic   Nustep  L2 x 8 min with concurrent review      Knee/Hip Exercises: Standing   Heel Raises  Both;2 sets;10 reps    Forward Step Up  Right;Left;Hand Hold: 1;Step Height: 6";2 sets;10 reps    Rocker Board  3 minutes    Other Standing Knee Exercises  standing on balance pad: weight shifting 3 ways x 1 minute each       Knee/Hip Exercises: Seated   Long Arc Quad  Strengthening;10 reps;Both;2 sets      Knee/Hip Exercises: Supine   Short Arc Quad Sets  Strengthening;Both;1 set;10 reps   2.5#   Short Arc Quad Sets Limitations  2.5 on Rt and Lt      Iontophoresis   Type of Iontophoresis  Dexamethasone    Location  left & Rt medial knee joint  Dose  1 ml    #3   Time  6 hr wear skin intact             PT Education - 05/05/19 1101    Education Details  ionto info    Person(s) Educated  Patient    Methods  Explanation;Handout    Comprehension  Verbalized understanding       PT Short Term Goals - 05/05/19 1026      PT SHORT TERM GOAL #2   Title  knee pain with daily tasks decreased to moderate level due to improved mobility    Time  4    Period  Weeks    Status  On-going        PT Long Term Goals - 04/18/19 1006      PT LONG TERM GOAL #1   Title  independent with HEP and understand how to progress correctly    Time  8    Period  Weeks    Status  New    Target Date  06/13/19      PT LONG TERM GOAL #2   Title  walking with less knee pain for 1 hour in the store due to improved flexibility and strength    Time  8    Period  Weeks    Status  New    Target Date  06/13/19      PT LONG TERM GOAL #3   Title  understand ways to manage pain in bilateral knees with flexibility  exericses, ice, and rest    Baseline  --    Time  8    Period  Weeks    Status  New    Target Date  06/13/19      PT LONG TERM GOAL #4   Title  FOTO score </= 29% limitation from 44% limitation    Baseline  ---    Time  8    Period  Weeks    Status  New    Target Date  06/13/19      PT LONG TERM GOAL #5   Title  knee pain with daily activities decreased to minimal to moderate due to increased overall function    Time  8    Period  Weeks    Status  New    Target Date  06/13/19            Plan - 05/05/19 1100    Clinical Impression Statement  Pt with increased bil knee pain due to rainy weather today. Pt did not report additional pain with exercises in the clinic today.  Pt provided stand by assistance for safety and to provide verbal cues for technique. Pt tolerated advancement of resistance on NuStep today.  Patient will benefit from skilled therapy to improve bilateral knee strength while reducing pain and improve function.    PT Frequency  2x / week    PT Duration  8 weeks    PT Treatment/Interventions  Cryotherapy;Electrical Stimulation;Iontophoresis 4mg /ml Dexamethasone;Moist Heat;Ultrasound;Therapeutic exercise;Therapeutic activities;Functional mobility training;Stair training;Neuromuscular re-education;Patient/family education;Gait training;Dry needling;Passive range of motion;Manual techniques;Taping    PT Next Visit Plan  continue with #4 ionto on right if needed and #5 on left; continue to build quad strength; check hip strength;    PT Home Exercise Plan  Access Code: [facility]    Consulted and Agree with Plan of Care  Patient       Patient will benefit from skilled therapeutic intervention in order to improve  the following deficits and impairments:  Difficulty walking, Decreased endurance, Decreased activity tolerance, Pain, Impaired flexibility, Decreased strength  Visit Diagnosis: Acute pain of right knee  Acute pain of left knee  Muscle weakness  (generalized)     Problem List Patient Active Problem List   Diagnosis Date Noted  . Coronary artery disease involving native coronary artery of native heart without angina pectoris 08/14/2018  . Cardiomyopathy ([place])   . NSTEMI (non-ST elevated myocardial infarction) ([place]) 04/24/2018  . Essential hypertension 04/24/2018  . Hyperlipidemia with target LDL less than 70 04/24/2018  . Inguinal hernia 01/14/2011     [name], PT 05/05/19 11:13 AM  [facility] [address], [place], [place], [address] Phone: [phone]   Fax:  [phone]  Name: KIMIE ORNE MRN: [id number] Date of Birth: Jul 10, 1931

## 2019-05-09 ENCOUNTER — Ambulatory Visit: Payer: Medicare Other

## 2019-05-09 ENCOUNTER — Other Ambulatory Visit: Payer: Self-pay

## 2019-05-09 DIAGNOSIS — M25561 Pain in right knee: Secondary | ICD-10-CM | POA: Diagnosis not present

## 2019-05-09 DIAGNOSIS — M6281 Muscle weakness (generalized): Secondary | ICD-10-CM

## 2019-05-09 DIAGNOSIS — M25562 Pain in left knee: Secondary | ICD-10-CM

## 2019-05-09 NOTE — Therapy (Signed)
[facility] [address] [place], [place], [address] Phone: [phone]   Fax:  [phone]  Physical Therapy Treatment  Patient Details  Name: Julie Petty MRN: [id number] Date of Birth: 11/08/1931 Referring Provider (PT): Dr. Gaynelle Arabian   Encounter Date: 05/09/2019  PT End of Session - 05/09/19 1101    Visit Number  6    Date for PT Re-Evaluation  06/13/19    Authorization Type  [employer]HC medicare    PT Start Time  1016    PT Stop Time  1059    PT Time Calculation (min)  43 min    Activity Tolerance  Patient tolerated treatment well;No increased pain    Behavior During Therapy  WFL for tasks assessed/performed       Past Medical History:  Diagnosis Date  . Complication of anesthesia   . Essential hypertension   . GERD (gastroesophageal reflux disease)   . Glaucoma   . Hernia   . Hyperlipemia   . Myocardial infarction ([place])    NSTEMI 04/24/18 (40% D2 by LHC; Dx: Takotsubo CM vs coronary spasm vs myocarditis)  . PONV (postoperative nausea and vomiting)     Past Surgical History:  Procedure Laterality Date  . CARDIAC CATHETERIZATION  04/05/03  . CHOLECYSTECTOMY N/A 08/17/2018   Procedure: Laparoscopic Cholecystectomy;  Surgeon: Erroll Luna, MD;  Location: [place];  Service: General;  Laterality: N/A;  . KNEE ARTHROSCOPY  09/17/05   left  . LEFT HEART CATH AND CORONARY ANGIOGRAPHY N/A 04/26/2018   Procedure: LEFT HEART CATH AND CORONARY ANGIOGRAPHY;  Surgeon: Leonie Man, MD;  Location: [place] [facility];  Service: Cardiovascular;  Laterality: N/A;  . VAGINAL HYSTERECTOMY  1980's    There were no vitals filed for this visit.  Subjective Assessment - 05/09/19 1024    Subjective  I felt good after last session.    Currently in Pain?  Yes    Pain Score  2    Lt 2/10, Rt 1/10   Pain Location  Knee    Pain Orientation  Left;Right    Pain Descriptors / Indicators  Aching    Pain Type  Acute pain     Pain Onset  More than a month ago    Pain Frequency  Intermittent    Aggravating Factors   walking, in the morning    Pain Relieving Factors  ionto patches, movement during the day                       [facility] Adult PT Treatment/Exercise - 05/09/19 0001      Lumbar Exercises: Aerobic   Nustep  L2 x 8 min (arms and legs)   PT present to discuss progress     Knee/Hip Exercises: Standing   Heel Raises  Both;2 sets;10 reps    Hip Abduction  Stengthening;Both;2 sets;10 reps;Knee straight    Abduction Limitations  2.5# added    Hip Extension  Stengthening;Both;2 sets;10 reps    Extension Limitations  2.5#     Rebounder  wight shifting 3 ways x 1 minute each      Knee/Hip Exercises: Seated   Long Arc Quad  Strengthening;10 reps;Both;2 sets    Long Arc Quad Weight  3 lbs.    Marching  Strengthening;2 sets;Both;10 reps;Weights    Marching Limitations  2.5# added               PT Short Term Goals -  05/09/19 1026      PT SHORT TERM GOAL #2   Title  knee pain with daily tasks decreased to moderate level due to improved mobility    Baseline  1-2/10    Status  Achieved        PT Long Term Goals - 05/09/19 1026      PT LONG TERM GOAL #1   Title  independent with HEP and understand how to progress correctly    Baseline  still learning    Time  8    Period  Weeks    Status  On-going      PT LONG TERM GOAL #2   Title  walking with less knee pain for 1 hour in the store due to improved flexibility and strength    Baseline  able to walk 20-30 minutes without incresaed pain    Time  8    Period  Weeks    Status  On-going            Plan - 05/09/19 1054    Clinical Impression Statement  Pt with reduced bil knee pain overall today.  Pt is able to walk for 20-30 minutes without limitation due to knee pain.  Pt is independent and compliant with HEP for strength and flexibility of bil knees.  Pt required minor verbal cueing for alignment of hips with  seated marching and to reduce knee hyperextension with standing hip exercises.  Pt will continue to benefit from comprehensive hip and knee strength and flexibility to address bil knee OA.    Comorbidities  s/p Gall bladder removal on 08/13/2018, Myocardial infarction, Glaucoma    PT Frequency  2x / week    PT Duration  8 weeks    PT Treatment/Interventions  Cryotherapy;Electrical Stimulation;Iontophoresis 4mg /ml Dexamethasone;Moist Heat;Ultrasound;Therapeutic exercise;Therapeutic activities;Functional mobility training;Stair training;Neuromuscular re-education;Patient/family education;Gait training;Dry needling;Passive range of motion;Manual techniques;Taping    PT Next Visit Plan  #4 ionto on right if needed and #5 on left; continue to build quad strength; check hip strength;    PT Home Exercise Plan  Access Code: [email or web address]    Consulted and Agree with Plan of Care  Patient       Patient will benefit from skilled therapeutic intervention in order to improve the following deficits and impairments:  Difficulty walking, Decreased endurance, Decreased activity tolerance, Pain, Impaired flexibility, Decreased strength  Visit Diagnosis: Acute pain of right knee  Acute pain of left knee  Muscle weakness (generalized)     Problem List Patient Active Problem List   Diagnosis Date Noted  . Coronary artery disease involving native coronary artery of native heart without angina pectoris 08/14/2018  . Cardiomyopathy ([place])   . NSTEMI (non-ST elevated myocardial infarction) ([place]) 04/24/2018  . Essential hypertension 04/24/2018  . Hyperlipidemia with target LDL less than 70 04/24/2018  . Inguinal hernia 01/14/2011     [name], PT 05/09/19 11:02 AM  [facility] [address], [place], [place], [address] Phone: [phone]   Fax:  [phone]  Name: Julie Petty MRN: [id number] Date of Birth: 04/24/1932

## 2019-05-11 ENCOUNTER — Other Ambulatory Visit: Payer: Self-pay

## 2019-05-11 ENCOUNTER — Ambulatory Visit: Payer: Medicare Other

## 2019-05-11 DIAGNOSIS — M25561 Pain in right knee: Secondary | ICD-10-CM | POA: Diagnosis not present

## 2019-05-11 DIAGNOSIS — M25562 Pain in left knee: Secondary | ICD-10-CM

## 2019-05-11 DIAGNOSIS — M6281 Muscle weakness (generalized): Secondary | ICD-10-CM

## 2019-05-11 NOTE — Therapy (Signed)
[facility] [address], [place], [place], [address] Phone: [phone]   Fax:  [phone]  Physical Therapy Treatment  Patient Details  Name: Julie Petty MRN: [id number] Date of Birth: 1932-05-29 Referring Provider (PT): Dr. Gaynelle Arabian   Encounter Date: 05/11/2019  PT End of Session - 05/11/19 1103    Visit Number  7    Date for PT Re-Evaluation  06/13/19    Authorization Type  [employer] medicare    PT Start Time  1016    PT Stop Time  1056    PT Time Calculation (min)  40 min    Activity Tolerance  Patient tolerated treatment well;No increased pain    Behavior During Therapy  WFL for tasks assessed/performed       Past Medical History:  Diagnosis Date  . Complication of anesthesia   . Essential hypertension   . GERD (gastroesophageal reflux disease)   . Glaucoma   . Hernia   . Hyperlipemia   . Myocardial infarction ([place])    NSTEMI 04/24/18 (40% D2 by LHC; Dx: Takotsubo CM vs coronary spasm vs myocarditis)  . PONV (postoperative nausea and vomiting)     Past Surgical History:  Procedure Laterality Date  . CARDIAC CATHETERIZATION  04/05/03  . CHOLECYSTECTOMY N/A 08/17/2018   Procedure: Laparoscopic Cholecystectomy;  Surgeon: Erroll Luna, MD;  Location: [place];  Service: General;  Laterality: N/A;  . KNEE ARTHROSCOPY  09/17/05   left  . LEFT HEART CATH AND CORONARY ANGIOGRAPHY N/A 04/26/2018   Procedure: LEFT HEART CATH AND CORONARY ANGIOGRAPHY;  Surgeon: Leonie Man, MD;  Location: [place] [facility];  Service: Cardiovascular;  Laterality: N/A;  . VAGINAL HYSTERECTOMY  1980's    There were no vitals filed for this visit.  Subjective Assessment - 05/11/19 1027    Subjective  My Lt knee is now a 3/10 and 1/10.  My pain in hte Lt knee is 10/10 at night.   Pt reports 50% improvement in Rt knee pain, no change in Lt pain.    Currently in Pain?  Yes    Pain Score  3    Lt knee 3/10, Rt knee 1/10    Pain Location  Knee    Pain Orientation  Right;Left    Pain Descriptors / Indicators  Aching    Pain Type  Acute pain    Pain Onset  More than a month ago    Pain Frequency  Intermittent    Aggravating Factors   night time, walking    Pain Relieving Factors  ionto patches,  moving around                       [facility] Adult PT Treatment/Exercise - 05/11/19 0001      Knee/Hip Exercises: Standing   Heel Raises  Both;2 sets;10 reps    Hip Abduction  Stengthening;Both;2 sets;10 reps;Knee straight    Abduction Limitations  2.5# added    Hip Extension  Stengthening;Both;2 sets;10 reps    Extension Limitations  2.5#     Rocker Board  3 minutes    Rebounder  wight shifting 3 ways x 1 minute each      Knee/Hip Exercises: Seated   Long Arc Quad  Strengthening;10 reps;Both;2 sets    Long Arc Quad Weight  3 lbs.    Marching  Strengthening;2 sets;Both;10 reps;Weights    Marching Limitations  2.5# added    Hamstring Curl  Strengthening;Both;2  sets;10 reps    Hamstring Limitations  e  red theraband      Iontophoresis   Type of Iontophoresis  Dexamethasone    Location  left & Rt medial knee joint   #5 Lt, #4 Rt   Dose  1 ml     Time  6 hr wear skin intact               PT Short Term Goals - 05/09/19 1026      PT SHORT TERM GOAL #2   Title  knee pain with daily tasks decreased to moderate level due to improved mobility    Baseline  1-2/10    Status  Achieved        PT Long Term Goals - 05/09/19 1026      PT LONG TERM GOAL #1   Title  independent with HEP and understand how to progress correctly    Baseline  still learning    Time  8    Period  Weeks    Status  On-going      PT LONG TERM GOAL #2   Title  walking with less knee pain for 1 hour in the store due to improved flexibility and strength    Baseline  able to walk 20-30 minutes without incresaed pain    Time  8    Period  Weeks    Status  On-going            Plan - 05/11/19 1034     Clinical Impression Statement  Pt reports 50% overall improvement in Rt LE pain and function since the start of care.  Pt with 10/10 Lt knee pain with waking at night.   Pt is able to walk for 20-30 minutes without limitation due to knee pain.  Pt is independent and compliant with HEP for strength and flexibility of bil knees.  Pt required supervision to provide verbal cues for alignment and to reduce knee hyperextension when standing.   Pt will continue to benefit from comprehensive hip and knee strength and flexibility to address bil knee OA.    PT Frequency  2x / week    PT Duration  8 weeks    PT Treatment/Interventions  Cryotherapy;Electrical Stimulation;Iontophoresis 4mg /ml Dexamethasone;Moist Heat;Ultrasound;Therapeutic exercise;Therapeutic activities;Functional mobility training;Stair training;Neuromuscular re-education;Patient/family education;Gait training;Dry needling;Passive range of motion;Manual techniques;Taping    PT Next Visit Plan  #5  ionto on right if needed and #6 on left next week; continue to build quad strength    PT Home Exercise Plan  Access Code: [email or web address]    Consulted and Agree with Plan of Care  Patient       Patient will benefit from skilled therapeutic intervention in order to improve the following deficits and impairments:  Difficulty walking, Decreased endurance, Decreased activity tolerance, Pain, Impaired flexibility, Decreased strength  Visit Diagnosis: Acute pain of right knee  Acute pain of left knee  Muscle weakness (generalized)     Problem List Patient Active Problem List   Diagnosis Date Noted  . Coronary artery disease involving native coronary artery of native heart without angina pectoris 08/14/2018  . Cardiomyopathy ([place])   . NSTEMI (non-ST elevated myocardial infarction) ([place]) 04/24/2018  . Essential hypertension 04/24/2018  . Hyperlipidemia with target LDL less than 70 04/24/2018  . Inguinal hernia 01/14/2011    [name],  PT 05/11/19 11:09 AM   [facility] [address] [place], [place], [address] Phone: [phone]   Fax:  [phone]  Name: Julie Petty MRN: [id number] Date of Birth: 1932/04/17

## 2019-05-16 ENCOUNTER — Ambulatory Visit: Payer: Medicare Other

## 2019-05-16 ENCOUNTER — Other Ambulatory Visit: Payer: Self-pay

## 2019-05-16 DIAGNOSIS — M25562 Pain in left knee: Secondary | ICD-10-CM

## 2019-05-16 DIAGNOSIS — M25561 Pain in right knee: Secondary | ICD-10-CM | POA: Diagnosis not present

## 2019-05-16 DIAGNOSIS — M6281 Muscle weakness (generalized): Secondary | ICD-10-CM

## 2019-05-16 NOTE — Patient Instructions (Signed)
Access Code: [facility]  URL: [email or web address]  Date: 05/16/2019  Prepared by: [name]   Standing Hip Abduction with Counter Support - 10 reps - 2 sets - 1x daily - 7x weekly Standing Hip Extension with Counter Support - 10 reps - 3 sets - 1x daily - 7x weekly Patient Education walking program Knee Osteoarthritis Managing Knee Pain

## 2019-05-16 NOTE — Therapy (Signed)
[facility] [address] [place], [place], [address] Phone: [phone]   Fax:  [phone]  Physical Therapy Treatment  Patient Details  Name: Julie Petty MRN: [id number] Date of Birth: Apr 26, 1932 Referring Provider (PT): Dr. Gaynelle Arabian   Encounter Date: 05/16/2019  PT End of Session - 05/16/19 1100    Visit Number  8    Date for PT Re-Evaluation  06/13/19    Authorization Type  [employer] medicare    PT Start Time  1019    PT Stop Time  1100    PT Time Calculation (min)  41 min    Activity Tolerance  Patient tolerated treatment well;No increased pain    Behavior During Therapy  WFL for tasks assessed/performed       Past Medical History:  Diagnosis Date  . Complication of anesthesia   . Essential hypertension   . GERD (gastroesophageal reflux disease)   . Glaucoma   . Hernia   . Hyperlipemia   . Myocardial infarction ([place])    NSTEMI 04/24/18 (40% D2 by LHC; Dx: Takotsubo CM vs coronary spasm vs myocarditis)  . PONV (postoperative nausea and vomiting)     Past Surgical History:  Procedure Laterality Date  . CARDIAC CATHETERIZATION  04/05/03  . CHOLECYSTECTOMY N/A 08/17/2018   Procedure: Laparoscopic Cholecystectomy;  Surgeon: Erroll Luna, MD;  Location: [place];  Service: General;  Laterality: N/A;  . KNEE ARTHROSCOPY  09/17/05   left  . LEFT HEART CATH AND CORONARY ANGIOGRAPHY N/A 04/26/2018   Procedure: LEFT HEART CATH AND CORONARY ANGIOGRAPHY;  Surgeon: Leonie Man, MD;  Location: [place] [facility];  Service: Cardiovascular;  Laterality: N/A;  . VAGINAL HYSTERECTOMY  1980's    There were no vitals filed for this visit.  Subjective Assessment - 05/16/19 1022    Subjective  I am feeling better. I have been massaging it where it hurts.  I am not waking up with pain now.    Currently in Pain?  Yes    Pain Score  3    3/10 Lt , 1/10 Rt   Pain Location  Knee    Pain Orientation  Right;Left     Pain Descriptors / Indicators  Aching    Pain Type  Acute pain    Pain Onset  More than a month ago    Pain Frequency  Intermittent    Aggravating Factors   night time, walking    Pain Relieving Factors  rubbing my knees, moving around                       [facility] Adult PT Treatment/Exercise - 05/16/19 0001      Knee/Hip Exercises: Standing   Heel Raises  Both;2 sets;10 reps    Hip Abduction  Stengthening;Both;2 sets;10 reps;Knee straight    Abduction Limitations  2.5# added    Hip Extension  Stengthening;Both;2 sets;10 reps    Extension Limitations  2.5#     Rocker Board  3 minutes    Rebounder  weight shifting 3 ways x 1 minute each      Knee/Hip Exercises: Seated   Long Arc Quad  Strengthening;10 reps;Both;2 sets    Long Arc Quad Weight  --   2.5#   Marching  Strengthening;2 sets;Both;10 reps;Weights    Marching Limitations  2.5# added    Hamstring Curl  Strengthening;Both;2 sets;10 reps    Hamstring Limitations  red theraband  Iontophoresis   Type of Iontophoresis  Dexamethasone    Location  Lt     Dose  61ml    #6 Lt   Time  6 hr wear skin intact             PT Education - 05/16/19 1032    Education Details  Access Code: [email or web address]    Person(s) Educated  Patient    Methods  Explanation;Demonstration;Handout    Comprehension  Verbalized understanding       PT Short Term Goals - 05/09/19 1026      PT SHORT TERM GOAL #2   Title  knee pain with daily tasks decreased to moderate level due to improved mobility    Baseline  1-2/10    Status  Achieved        PT Long Term Goals - 05/16/19 1035      PT LONG TERM GOAL #3   Title  understand ways to manage pain in bilateral knees with flexibility exericses, ice, and rest    Status  Achieved            Plan - 05/16/19 1041    Clinical Impression Statement  Pt has had relief of night time knee pain over the past 4 nights and has been able to sleep without interruption.  Pt is able  to walk for 30 minutes with fatigue at the end of this.  Pt required stand by assistance through exercise to provide verbal cues and for safety.  Pt performed all exercises without increased pain today.  Pt will continue to benefit from skilled PT for strength, flexibility, manual as needed and up to 2 more ionto patches on the Rt.    PT Frequency  2x / week    PT Duration  8 weeks    PT Treatment/Interventions  Cryotherapy;Electrical Stimulation;Iontophoresis 4mg /ml Dexamethasone;Moist Heat;Ultrasound;Therapeutic exercise;Therapeutic activities;Functional mobility training;Stair training;Neuromuscular re-education;Patient/family education;Gait training;Dry needling;Passive range of motion;Manual techniques;Taping    PT Next Visit Plan  quad and hip strength.  Can do 2 more ionto patches on the Rt if needed    PT Home Exercise Plan  Access Code: [place] and Agree with Plan of Care  Patient       Patient will benefit from skilled therapeutic intervention in order to improve the following deficits and impairments:  Difficulty walking, Decreased endurance, Decreased activity tolerance, Pain, Impaired flexibility, Decreased strength  Visit Diagnosis: Acute pain of right knee  Acute pain of left knee  Muscle weakness (generalized)     Problem List Patient Active Problem List   Diagnosis Date Noted  . Coronary artery disease involving native coronary artery of native heart without angina pectoris 08/14/2018  . Cardiomyopathy ([place])   . NSTEMI (non-ST elevated myocardial infarction) ([place]) 04/24/2018  . Essential hypertension 04/24/2018  . Hyperlipidemia with target LDL less than 70 04/24/2018  . Inguinal hernia 01/14/2011     Julie Petty, PT 05/16/19 11:02 AM  [facility] [address], [place], [place], [address] Phone: [phone]   Fax:  [phone]  Name: Julie Petty MRN: [id number] Date of  Birth: [birth year]

## 2019-05-23 ENCOUNTER — Other Ambulatory Visit: Payer: Self-pay

## 2019-05-23 ENCOUNTER — Ambulatory Visit: Payer: Medicare Other

## 2019-05-23 DIAGNOSIS — M25561 Pain in right knee: Secondary | ICD-10-CM

## 2019-05-23 DIAGNOSIS — M6281 Muscle weakness (generalized): Secondary | ICD-10-CM

## 2019-05-23 DIAGNOSIS — M25562 Pain in left knee: Secondary | ICD-10-CM

## 2019-05-23 NOTE — Therapy (Signed)
[facility] [address], [place], [place], [address] Phone: [phone]   Fax:  [phone]  Physical Therapy Treatment  Patient Details  Name: Julie Petty MRN: [id number] Date of Birth: 1932-03-27 Referring Provider (PT): Dr. Gaynelle Arabian   Encounter Date: 05/23/2019  PT End of Session - 05/23/19 1059    Visit Number  9    Date for PT Re-Evaluation  06/13/19    Authorization Type  [employer] medicare    PT Start Time  [id number]    PT Stop Time  1101    PT Time Calculation (min)  43 min    Activity Tolerance  Patient tolerated treatment well;No increased pain    Behavior During Therapy  WFL for tasks assessed/performed       Past Medical History:  Diagnosis Date  . Complication of anesthesia   . Essential hypertension   . GERD (gastroesophageal reflux disease)   . Glaucoma   . Hernia   . Hyperlipemia   . Myocardial infarction ([place])    NSTEMI 04/24/18 (40% D2 by LHC; Dx: Takotsubo CM vs coronary spasm vs myocarditis)  . PONV (postoperative nausea and vomiting)     Past Surgical History:  Procedure Laterality Date  . CARDIAC CATHETERIZATION  04/05/03  . CHOLECYSTECTOMY N/A 08/17/2018   Procedure: Laparoscopic Cholecystectomy;  Surgeon: Erroll Luna, MD;  Location: [place];  Service: General;  Laterality: N/A;  . KNEE ARTHROSCOPY  09/17/05   left  . LEFT HEART CATH AND CORONARY ANGIOGRAPHY N/A 04/26/2018   Procedure: LEFT HEART CATH AND CORONARY ANGIOGRAPHY;  Surgeon: Leonie Man, MD;  Location: [facility];  Service: Cardiovascular;  Laterality: N/A;  . VAGINAL HYSTERECTOMY  1980's    There were no vitals filed for this visit.  Subjective Assessment - 05/23/19 1020    Subjective  I think I am really getting better.  I am not waking with as much Lt knee pain.  I feel 65% better since the start of care.    Currently in Pain?  Yes    Pain Score  3     Pain Location  Knee    Pain Orientation  Left     Pain Descriptors / Indicators  Aching    Pain Type  Chronic pain    Pain Onset  More than a month ago    Pain Frequency  Intermittent    Aggravating Factors   night time, walking    Pain Relieving Factors  ionto patches, rubbing my knees                       [facility] Adult PT Treatment/Exercise - 05/23/19 0001      Knee/Hip Exercises: Standing   Heel Raises  Both;2 sets;10 reps    Heel Raises Limitations  standing on black pad    Hip Abduction  Stengthening;Both;2 sets;10 reps;Knee straight    Abduction Limitations  2.5# added    Hip Extension  Stengthening;Both;2 sets;10 reps    Extension Limitations  2.5#     Rocker Board  3 minutes    Rebounder  weight shifting 3 ways x 1 minute each      Knee/Hip Exercises: Seated   Long Arc Quad  Strengthening;10 reps;Both;2 sets    Long Arc Quad [employer]  --   2.5#   Marching  Strengthening;2 sets;Both;10 reps;Weights    Marching Limitations  2.5# added    Hamstring Curl  Strengthening;Both;2 sets;10 reps  Hamstring Limitations  red theraband      Iontophoresis   Type of Iontophoresis  --    Location  --    Dose  --    Time  --               PT Short Term Goals - 05/09/19 1026      PT SHORT TERM GOAL #2   Title  knee pain with daily tasks decreased to moderate level due to improved mobility    Baseline  1-2/10    Status  Achieved        PT Long Term Goals - 05/23/19 1024      PT LONG TERM GOAL #1   Title  independent with HEP and understand how to progress correctly    Time  8    Period  Weeks    Status  On-going      PT LONG TERM GOAL #2   Title  walking with less knee pain for 1 hour in the store due to improved flexibility and strength    Baseline  able to walk 30 minutes    Time  8    Period  Weeks    Status  On-going      PT LONG TERM GOAL #3   Title  understand ways to manage pain in bilateral knees with flexibility exericses, ice, and rest    Status  Achieved      PT LONG TERM  GOAL #5   Title  knee pain with daily activities decreased to minimal to moderate due to increased overall function    Baseline  Lt knee 3/10 with activity    Time  8    Period  Weeks    Status  On-going            Plan - 05/23/19 1031    Clinical Impression Statement  Pt reports 65% overall improvement in Lt knee pain since the start of care.  Pt is able to sleep without interruption until 5 am now and Lt knee is the only knee that is painful.  Pt is able to walk for 30 minutes without limitation due to pain.  Pt requires stand by assistance for safety and verbal cues for technique with exercise.  Pt will continue to benefit from strength and endurance progression to address bil knee OA.    PT Treatment/Interventions  Cryotherapy;Electrical Stimulation;Iontophoresis 4mg /ml Dexamethasone;Moist Heat;Ultrasound;Therapeutic exercise;Therapeutic activities;Functional mobility training;Stair training;Neuromuscular re-education;Patient/family education;Gait training;Dry needling;Passive range of motion;Manual techniques;Taping    PT Next Visit Plan  quad and hip strength.  2 more patch to Rt knee if needed    PT Home Exercise Plan  Access Code: [email or web address]    Consulted and Agree with Plan of Care  Patient       Patient will benefit from skilled therapeutic intervention in order to improve the following deficits and impairments:  Difficulty walking, Decreased endurance, Decreased activity tolerance, Pain, Impaired flexibility, Decreased strength  Visit Diagnosis: Acute pain of right knee  Acute pain of left knee  Muscle weakness (generalized)     Problem List Patient Active Problem List   Diagnosis Date Noted  . Coronary artery disease involving native coronary artery of native heart without angina pectoris 08/14/2018  . Cardiomyopathy ([place])   . NSTEMI (non-ST elevated myocardial infarction) ([place]) 04/24/2018  . Essential hypertension 04/24/2018  . Hyperlipidemia with target LDL less  than 70 04/24/2018  . Inguinal hernia 01/14/2011    [name], PT 05/23/19 11:02  AM  [facility] [address], [place], [place], [address] Phone: [phone]   Fax:  [phone]  Name: Julie Petty MRN: [id number] Date of Birth: Apr 20, 1932

## 2019-05-25 ENCOUNTER — Ambulatory Visit: Payer: Medicare Other | Attending: Orthopedic Surgery

## 2019-05-25 ENCOUNTER — Other Ambulatory Visit: Payer: Self-pay

## 2019-05-25 DIAGNOSIS — M25561 Pain in right knee: Secondary | ICD-10-CM | POA: Insufficient documentation

## 2019-05-25 DIAGNOSIS — M6281 Muscle weakness (generalized): Secondary | ICD-10-CM | POA: Diagnosis present

## 2019-05-25 DIAGNOSIS — M25562 Pain in left knee: Secondary | ICD-10-CM | POA: Insufficient documentation

## 2019-05-25 NOTE — Therapy (Signed)
[facility] [address] [place], [place], [address] Phone: [phone]   Fax:  [phone]  Physical Therapy Treatment  Patient Details  Name: Julie Petty MRN: [id number] Date of Birth: December 02, 1931 Referring Provider (PT): Dr. Gaynelle Arabian   Encounter Date: 05/25/2019 Progress Note Reporting Period 04/18/2019 to 05/25/2019  See note below for Objective Data and Assessment of Progress/Goals.      PT End of Session - 05/25/19 1100    Visit Number  10    Date for PT Re-Evaluation  06/13/19    Authorization Type  [employer]HC medicare    PT Start Time  1019    PT Stop Time  1059    PT Time Calculation (min)  40 min    Activity Tolerance  Patient tolerated treatment well;No increased pain    Behavior During Therapy  WFL for tasks assessed/performed       Past Medical History:  Diagnosis Date  . Complication of anesthesia   . Essential hypertension   . GERD (gastroesophageal reflux disease)   . Glaucoma   . Hernia   . Hyperlipemia   . Myocardial infarction ([place])    NSTEMI 04/24/18 (40% D2 by LHC; Dx: Takotsubo CM vs coronary spasm vs myocarditis)  . PONV (postoperative nausea and vomiting)     Past Surgical History:  Procedure Laterality Date  . CARDIAC CATHETERIZATION  04/05/03  . CHOLECYSTECTOMY N/A 08/17/2018   Procedure: Laparoscopic Cholecystectomy;  Surgeon: Erroll Luna, MD;  Location: [place];  Service: General;  Laterality: N/A;  . KNEE ARTHROSCOPY  09/17/05   left  . LEFT HEART CATH AND CORONARY ANGIOGRAPHY N/A 04/26/2018   Procedure: LEFT HEART CATH AND CORONARY ANGIOGRAPHY;  Surgeon: Leonie Man, MD;  Location: [place] [facility];  Service: Cardiovascular;  Laterality: N/A;  . VAGINAL HYSTERECTOMY  1980's    There were no vitals filed for this visit.  Subjective Assessment - 05/25/19 1021    Subjective  I have had 2 good mornings this week.  No pain at all right now.    Currently in Pain?   No/denies         [facility] PT Assessment - 05/25/19 0001      Assessment   Medical Diagnosis  M17.11 Unilateral primary osteoarthritis, right knee; bilateral knee pain    Referring Provider (PT)  Dr. Pilar Plate Aluisio    Onset Date/Surgical Date  02/22/19      Prior Function   Level of Independence  Independent    Vocation  Retired      Observation/Other Assessments   Focus on Therapeutic Outcomes (FOTO)   37% limitation      Strength   Right Knee Flexion  5/5    Right Knee Extension  5/5    Left Knee Flexion  4+/5    Left Knee Extension  4+/5                   [facility] Adult PT Treatment/Exercise - 05/25/19 0001      Knee/Hip Exercises: Aerobic   Nustep  Level 2x 10 minutes   PT present to discuss progress     Knee/Hip Exercises: Standing   Heel Raises  Both;2 sets;10 reps    Heel Raises Limitations  standing on black pad    Hip Abduction  Stengthening;Both;2 sets;10 reps;Knee straight    Abduction Limitations  2.5# added standing on black pad    Hip Extension  Stengthening;Both;2 sets;10 reps  Extension Limitations  2.5# added, standing on black pad    Rocker Board  3 minutes    Rebounder  weight shifting 3 ways x 1 minute each      Knee/Hip Exercises: Seated   Long Arc Quad  Strengthening;10 reps;Both;2 sets    Long Arc Quad Weight  --   2.5#   Marching  Strengthening;2 sets;Both;10 reps;Weights    Marching Limitations  2.5# added             PT Education - 05/25/19 1056    Education Details  information on ankle weights- emailed [place] link    Person(s) Educated  Patient    Methods  Explanation    Comprehension  Verbalized understanding       PT Short Term Goals - 05/09/19 1026      PT SHORT TERM GOAL #2   Title  knee pain with daily tasks decreased to moderate level due to improved mobility    Baseline  1-2/10    Status  Achieved        PT Long Term Goals - 05/25/19 1024      PT LONG TERM GOAL #1   Title  independent with HEP and  understand how to progress correctly    Baseline  independent in current HEP    Time  8    Period  Weeks    Status  On-going      PT LONG TERM GOAL #2   Title  walking with less knee pain for 1 hour in the store due to improved flexibility and strength    Baseline  able to walk 30 minutes    Time  8    Period  Weeks    Status  On-going      PT LONG TERM GOAL #3   Title  understand ways to manage pain in bilateral knees with flexibility exericses, ice, and rest    Status  Achieved      PT LONG TERM GOAL #5   Title  knee pain with daily activities decreased to minimal to moderate due to increased overall function    Baseline  Lt knee 3/10 with activity    Time  8    Period  Weeks    Status  On-going            Plan - 05/25/19 1041    Clinical Impression Statement  Pt reports 80% overall improvement in symptoms and mobility since the start of care.  FOTO is improved to 37% limitation (44% limitation at evaluation).  Pt is limited to walking ~30 minutes max due to increased pain with longer distances.  Pt is going to work on building her time over the next few weeks.  Lt and Rt knee strength is improved.  Pt with mild to moderate Lt knee pain that is intermittent based on activity. Pt advanced to standing exercise on balance pad and reuqired stand by assistance for safety.  Pt will continue to benefit from skilled PT for strength, flexibility, mobility and balance to improve safety and independence at home and in the community.    PT Frequency  2x / week    PT Duration  8 weeks    PT Treatment/Interventions  Cryotherapy;Electrical Stimulation;Iontophoresis 4mg /ml Dexamethasone;Moist Heat;Ultrasound;Therapeutic exercise;Therapeutic activities;Functional mobility training;Stair training;Neuromuscular re-education;Patient/family education;Gait training;Dry needling;Passive range of motion;Manual techniques;Taping    PT Next Visit Plan  quad and hip strength.  Balance and endurance tasks.  2 more patch to Rt knee if needed  PT Home Exercise Plan  Access Code: [email or web address]    Consulted and Agree with Plan of Care  Patient       Patient will benefit from skilled therapeutic intervention in order to improve the following deficits and impairments:  Difficulty walking, Decreased endurance, Decreased activity tolerance, Pain, Impaired flexibility, Decreased strength  Visit Diagnosis: Acute pain of right knee  Acute pain of left knee  Muscle weakness (generalized)     Problem List Patient Active Problem List   Diagnosis Date Noted  . Coronary artery disease involving native coronary artery of native heart without angina pectoris 08/14/2018  . Cardiomyopathy ([place])   . NSTEMI (non-ST elevated myocardial infarction) ([place]) 04/24/2018  . Essential hypertension 04/24/2018  . Hyperlipidemia with target LDL less than 70 04/24/2018  . Inguinal hernia 01/14/2011    [name], PT 05/25/19 11:04 AM  [facility] [address], [place], [place], [address] Phone: [phone]   Fax:  [phone]  Name: Julie Petty MRN: [id number] Date of Birth: 1932-02-09

## 2019-05-30 ENCOUNTER — Other Ambulatory Visit: Payer: Self-pay

## 2019-05-30 ENCOUNTER — Ambulatory Visit: Payer: Medicare Other

## 2019-05-30 DIAGNOSIS — M25562 Pain in left knee: Secondary | ICD-10-CM

## 2019-05-30 DIAGNOSIS — M6281 Muscle weakness (generalized): Secondary | ICD-10-CM

## 2019-05-30 DIAGNOSIS — M25561 Pain in right knee: Secondary | ICD-10-CM | POA: Diagnosis not present

## 2019-05-30 NOTE — Therapy (Signed)
[facility] [address] [place], [place], [address] Phone: [phone]   Fax:  [phone]  Physical Therapy Treatment  Patient Details  Name: Julie Petty MRN: [id number] Date of Birth: [birth year] Referring Provider (PT): Dr. Gaynelle Arabian   Encounter Date: 05/30/2019  PT End of Session - 05/30/19 1138    Visit Number  11    Date for PT Re-Evaluation  06/13/19    Authorization Type  [employer] medicare    PT Start Time  [id number]    PT Stop Time  [id number]    PT Time Calculation (min)  41 min    Activity Tolerance  Patient tolerated treatment well;No increased pain    Behavior During Therapy  WFL for tasks assessed/performed       Past Medical History:  Diagnosis Date  . Complication of anesthesia   . Essential hypertension   . GERD (gastroesophageal reflux disease)   . Glaucoma   . Hernia   . Hyperlipemia   . Myocardial infarction ([place])    NSTEMI 04/24/18 (40% D2 by LHC; Dx: Takotsubo CM vs coronary spasm vs myocarditis)  . PONV (postoperative nausea and vomiting)     Past Surgical History:  Procedure Laterality Date  . CARDIAC CATHETERIZATION  04/05/03  . CHOLECYSTECTOMY N/A 08/17/2018   Procedure: Laparoscopic Cholecystectomy;  Surgeon: Erroll Luna, MD;  Location: [place];  Service: General;  Laterality: N/A;  . KNEE ARTHROSCOPY  09/17/05   left  . LEFT HEART CATH AND CORONARY ANGIOGRAPHY N/A 04/26/2018   Procedure: LEFT HEART CATH AND CORONARY ANGIOGRAPHY;  Surgeon: Leonie Man, MD;  Location: [place] [facility];  Service: Cardiovascular;  Laterality: N/A;  . VAGINAL HYSTERECTOMY  1980's    There were no vitals filed for this visit.  Subjective Assessment - 05/30/19 1106    Subjective  I had 2.5 days of pain over the weekend and now I feel OK    Currently in Pain?  No/denies   up to 10/10 on Lt and 5/10 on the Rt                      [facility] Adult PT Treatment/Exercise - 05/30/19 0001       Knee/Hip Exercises: Aerobic   Nustep  Level 3-->2x 10 minutes   PT present to discuss progress     Knee/Hip Exercises: Standing   Heel Raises  Both;2 sets;10 reps    Heel Raises Limitations  standing on black pad    Hip Abduction  Stengthening;Both;2 sets;10 reps;Knee straight    Abduction Limitations  2.5# added standing on black pad    Hip Extension  Stengthening;Both;2 sets;10 reps    Extension Limitations  2.5# added, standing on black pad    Rocker Board  3 minutes    Rebounder  weight shifting 3 ways x 1 minute each      Knee/Hip Exercises: Seated   Long Arc Quad  Strengthening;10 reps;Both;2 sets    Marching  Strengthening;2 sets;Both;10 reps;Weights    Marching Limitations  2.5# added               PT Short Term Goals - 05/09/19 1026      PT SHORT TERM GOAL #2   Title  knee pain with daily tasks decreased to moderate level due to improved mobility    Baseline  1-2/10    Status  Achieved        PT Long Term Goals -  05/25/19 1024      PT LONG TERM GOAL #1   Title  independent with HEP and understand how to progress correctly    Baseline  independent in current HEP    Time  8    Period  Weeks    Status  On-going      PT LONG TERM GOAL #2   Title  walking with less knee pain for 1 hour in the store due to improved flexibility and strength    Baseline  able to walk 30 minutes    Time  8    Period  Weeks    Status  On-going      PT LONG TERM GOAL #3   Title  understand ways to manage pain in bilateral knees with flexibility exericses, ice, and rest    Status  Achieved      PT LONG TERM GOAL #5   Title  knee pain with daily activities decreased to minimal to moderate due to increased overall function    Baseline  Lt knee 3/10 with activity    Time  8    Period  Weeks    Status  On-going            Plan - 05/30/19 1115    Clinical Impression Statement  Pt had a flare-up of bil nee pain over the weekend.  Pain is now resolved and pt was  able to participate in exercise in the clinic.  Pt now has ankle weights and PT discussed how to advance exercise at home.  Pt required minor tactile cues for alignment with standing exercise and required stand by assistance when standing on the black balance pad for safety.  Pt will continue to benefit from skilled PT to address hip, knee and ankle strength to address bil knee OA.    PT Frequency  2x / week    PT Duration  8 weeks    PT Treatment/Interventions  Cryotherapy;Electrical Stimulation;Iontophoresis 4mg /ml Dexamethasone;Moist Heat;Ultrasound;Therapeutic exercise;Therapeutic activities;Functional mobility training;Stair training;Neuromuscular re-education;Patient/family education;Gait training;Dry needling;Passive range of motion;Manual techniques;Taping    PT Next Visit Plan  quad and hip strength.  Balance and endurance tasks. 2 more patch to Rt knee if needed    PT Home Exercise Plan  Access Code: [email or web address]    Consulted and Agree with Plan of Care  Patient       Patient will benefit from skilled therapeutic intervention in order to improve the following deficits and impairments:  Difficulty walking, Decreased endurance, Decreased activity tolerance, Pain, Impaired flexibility, Decreased strength  Visit Diagnosis: Acute pain of right knee  Acute pain of left knee  Muscle weakness (generalized)     Problem List Patient Active Problem List   Diagnosis Date Noted  . Coronary artery disease involving native coronary artery of native heart without angina pectoris 08/14/2018  . Cardiomyopathy ([place])   . NSTEMI (non-ST elevated myocardial infarction) ([place]) 04/24/2018  . Essential hypertension 04/24/2018  . Hyperlipidemia with target LDL less than 70 04/24/2018  . Inguinal hernia 01/14/2011    [name], PT 05/30/19 11:41 AM  [facility] [address] [place], [place], [address] Phone: [phone]   Fax:   [phone]  Name: Julie Petty MRN: [id number] Date of Birth: July 09, 1931

## 2019-06-01 ENCOUNTER — Encounter: Payer: Self-pay | Admitting: Physical Therapy

## 2019-06-01 ENCOUNTER — Other Ambulatory Visit: Payer: Self-pay

## 2019-06-01 ENCOUNTER — Ambulatory Visit: Payer: Medicare Other | Admitting: Physical Therapy

## 2019-06-01 DIAGNOSIS — M25561 Pain in right knee: Secondary | ICD-10-CM

## 2019-06-01 DIAGNOSIS — M25562 Pain in left knee: Secondary | ICD-10-CM

## 2019-06-01 DIAGNOSIS — M6281 Muscle weakness (generalized): Secondary | ICD-10-CM

## 2019-06-01 NOTE — Therapy (Signed)
[facility] [address] [place], [place], [address] Phone: [phone]   Fax:  [phone]  Physical Therapy Treatment  Patient Details  Name: Julie Petty MRN: [id number] Date of Birth: 1932-03-30 Referring Provider (PT): Dr. Gaynelle Arabian   Encounter Date: 06/01/2019  PT End of Session - 06/01/19 1018    Visit Number  12    Date for PT Re-Evaluation  06/13/19    Authorization Type  [employer] medicare    PT Start Time  1016    PT Stop Time  1055    PT Time Calculation (min)  39 min    Activity Tolerance  Patient tolerated treatment well    Behavior During Therapy  [facility] for tasks assessed/performed       Past Medical History:  Diagnosis Date  . Complication of anesthesia   . Essential hypertension   . GERD (gastroesophageal reflux disease)   . Glaucoma   . Hernia   . Hyperlipemia   . Myocardial infarction ([place])    NSTEMI 04/24/18 (40% D2 by LHC; Dx: Takotsubo CM vs coronary spasm vs myocarditis)  . PONV (postoperative nausea and vomiting)     Past Surgical History:  Procedure Laterality Date  . CARDIAC CATHETERIZATION  04/05/03  . CHOLECYSTECTOMY N/A 08/17/2018   Procedure: Laparoscopic Cholecystectomy;  Surgeon: Erroll Luna, MD;  Location: [place];  Service: General;  Laterality: N/A;  . KNEE ARTHROSCOPY  09/17/05   left  . LEFT HEART CATH AND CORONARY ANGIOGRAPHY N/A 04/26/2018   Procedure: LEFT HEART CATH AND CORONARY ANGIOGRAPHY;  Surgeon: Leonie Man, MD;  Location: [place] [facility];  Service: Cardiovascular;  Laterality: N/A;  . VAGINAL HYSTERECTOMY  1980's    There were no vitals filed for this visit.  Subjective Assessment - 06/01/19 1020    Subjective  My pain is typically 4-5 AM. I got some OTC Voltarin gel to try. I just applied it before coming so i will hold on ionto patch today.    Currently in Pain?  No/denies    Multiple Pain Sites  No                        [facility] Adult PT Treatment/Exercise - 06/01/19 0001      Knee/Hip Exercises: Aerobic   Nustep  L2 x 10 min, PTA present to discuss status      Knee/Hip Exercises: Standing   Heel Raises  Both;1 set;20 reps    Hip Abduction  Stengthening;Both;2 sets;10 reps;Knee straight    Abduction Limitations  2.5# stepping up onto th eblue pod   TC to keep pelvis straight   Rebounder  weight shifting 3 ways x 1 minute each    Other Standing Knee Exercises  green band side stepping 4x 15 ft SBA, VC for alignment LTLE      Knee/Hip Exercises: Seated   Long Arc Quad  Strengthening;Both;2 sets;10 reps;Weights    Long Arc Quad Weight  --   2.5 tried with ball squeeze to promote more VMO   Marching  Strengthening;Both;1 set;20 reps;Weights    Marching Limitations  2.5# added      Knee/Hip Exercises: Supine   Straight Leg Raises  Strengthening;Both;2 sets;10 reps    Straight Leg Raises Limitations  2# added on second set               PT Short Term Goals - 05/09/19 1026      PT SHORT  TERM GOAL #2   Title  knee pain with daily tasks decreased to moderate level due to improved mobility    Baseline  1-2/10    Status  Achieved        PT Long Term Goals - 05/25/19 1024      PT LONG TERM GOAL #1   Title  independent with HEP and understand how to progress correctly    Baseline  independent in current HEP    Time  8    Period  Weeks    Status  On-going      PT LONG TERM GOAL #2   Title  walking with less knee pain for 1 hour in the store due to improved flexibility and strength    Baseline  able to walk 30 minutes    Time  8    Period  Weeks    Status  On-going      PT LONG TERM GOAL #3   Title  understand ways to manage pain in bilateral knees with flexibility exericses, ice, and rest    Status  Achieved      PT LONG TERM GOAL #5   Title  knee pain with daily activities decreased to minimal to moderate due to increased overall function     Baseline  Lt knee 3/10 with activity    Time  8    Period  Weeks    Status  On-going            Plan - 06/01/19 1051    Clinical Impression Statement  Pt has recovered well from her last flare up. She reports duirng the day she is "doing fine" It tends to be between 4-5AM when she gets intense pain. She had no pain with PREs today and no soreness post treatment. Pt has purchased 3# weights for home use.    Personal Factors and Comorbidities  Age;Comorbidity 1;Comorbidity 2;Comorbidity 3+    Comorbidities  s/p Gall bladder removal on 08/13/2018, Myocardial infarction, Glaucoma    Examination-Activity Limitations  Stairs;Stand;Squat;Locomotion Level    Examination-Participation Restrictions  Yard Work;Cleaning;Community Activity    Stability/Clinical Decision Making  Evolving/Moderate complexity    Rehab Potential  Excellent    PT Frequency  2x / week    PT Duration  8 weeks    PT Treatment/Interventions  Cryotherapy;Electrical Stimulation;Iontophoresis 4mg /ml Dexamethasone;Moist Heat;Ultrasound;Therapeutic exercise;Therapeutic activities;Functional mobility training;Stair training;Neuromuscular re-education;Patient/family education;Gait training;Dry needling;Passive range of motion;Manual techniques;Taping    PT Next Visit Plan  quad and hip strength.  Balance and endurance tasks. 2 more patch to Rt knee if needed    PT Home Exercise Plan  Access Code: [email or web address]       Patient will benefit from skilled therapeutic intervention in order to improve the following deficits and impairments:  Difficulty walking, Decreased endurance, Decreased activity tolerance, Pain, Impaired flexibility, Decreased strength  Visit Diagnosis: Acute pain of right knee  Acute pain of left knee  Muscle weakness (generalized)     Problem List Patient Active Problem List   Diagnosis Date Noted  . Coronary artery disease involving native coronary artery of native heart without angina pectoris 08/14/2018   . Cardiomyopathy ([place])   . NSTEMI (non-ST elevated myocardial infarction) ([place]) 04/24/2018  . Essential hypertension 04/24/2018  . Hyperlipidemia with target LDL less than 70 04/24/2018  . Inguinal hernia 01/14/2011    Analiya Porco, PTA 06/01/2019, 10:57 AM  [facility] [address], [place], [place], [address] Phone: [phone]  Fax:  [phone]  Name: Julie Petty MRN: [id number] Date of Birth: 1932/05/04

## 2019-06-06 ENCOUNTER — Ambulatory Visit: Payer: Medicare Other

## 2019-06-06 ENCOUNTER — Other Ambulatory Visit: Payer: Self-pay

## 2019-06-06 DIAGNOSIS — M25561 Pain in right knee: Secondary | ICD-10-CM | POA: Diagnosis not present

## 2019-06-06 DIAGNOSIS — M25562 Pain in left knee: Secondary | ICD-10-CM

## 2019-06-06 DIAGNOSIS — M6281 Muscle weakness (generalized): Secondary | ICD-10-CM

## 2019-06-06 NOTE — Therapy (Signed)
[facility] [address], [place], [place], [address] Phone: [phone]   Fax:  [phone]  Physical Therapy Treatment  Patient Details  Name: Julie Petty MRN: [id number] Date of Birth: 1931/10/28 Referring Provider (PT): Dr. Gaynelle Arabian   Encounter Date: 06/06/2019  PT End of Session - 06/06/19 1137    Visit Number  13    Date for PT Re-Evaluation  06/13/19    Authorization Type  [employer]HC medicare    PT Start Time  1058    PT Stop Time  1141    PT Time Calculation (min)  43 min    Activity Tolerance  Patient tolerated treatment well    Behavior During Therapy  [facility] for tasks assessed/performed       Past Medical History:  Diagnosis Date  . Complication of anesthesia   . Essential hypertension   . GERD (gastroesophageal reflux disease)   . Glaucoma   . Hernia   . Hyperlipemia   . Myocardial infarction ([place])    NSTEMI 04/24/18 (40% D2 by LHC; Dx: Takotsubo CM vs coronary spasm vs myocarditis)  . PONV (postoperative nausea and vomiting)     Past Surgical History:  Procedure Laterality Date  . CARDIAC CATHETERIZATION  04/05/03  . CHOLECYSTECTOMY N/A 08/17/2018   Procedure: Laparoscopic Cholecystectomy;  Surgeon: Erroll Luna, MD;  Location: [place];  Service: General;  Laterality: N/A;  . KNEE ARTHROSCOPY  09/17/05   left  . LEFT HEART CATH AND CORONARY ANGIOGRAPHY N/A 04/26/2018   Procedure: LEFT HEART CATH AND CORONARY ANGIOGRAPHY;  Surgeon: Leonie Man, MD;  Location: [place] [facility];  Service: Cardiovascular;  Laterality: N/A;  . VAGINAL HYSTERECTOMY  1980's    There were no vitals filed for this visit.  Subjective Assessment - 06/06/19 1101    Subjective  I had to take Advil in the night due to bil knee pain.  I am feeling better now.  I think it is the weather.    Currently in Pain?  No/denies   10/10 at night.   Pain Score  0-No pain    Pain Location  Knee    Pain Orientation   Left;Right    Pain Descriptors / Indicators  Aching;Sore    Pain Type  Chronic pain    Pain Onset  More than a month ago    Pain Frequency  Intermittent    Aggravating Factors   weather, night time    Pain Relieving Factors  Advil, rubbing my knee                       [facility] Adult PT Treatment/Exercise - 06/06/19 0001      Knee/Hip Exercises: Aerobic   Nustep  L2 x 10 min, PT present to discuss status      Knee/Hip Exercises: Standing   Heel Raises  Both;1 set;20 reps    Hip Abduction  Stengthening;Both;2 sets;10 reps;Knee straight    Abduction Limitations  2.5# stepping up onto the blue pod   TC to keep pelvis straight   Rebounder  weight shifting 3 ways x 1 minute each      Knee/Hip Exercises: Seated   Long Arc Quad  Strengthening;Both;2 sets;10 reps;Weights    Long Arc Quad Weight  --   2.5 tried with ball squeeze to promote more VMO   Marching  Strengthening;Both;1 set;20 reps;Weights    Marching Limitations  2.5# added      Knee/Hip  Exercises: Supine   Straight Leg Raises  Strengthening;Both;2 sets;10 reps    Straight Leg Raises Limitations  2# added on second set      Iontophoresis   Type of Iontophoresis  Dexamethasone    Location  Rt knee    Dose  22ml     Time  6 hr wear skin intact               PT Short Term Goals - 05/09/19 1026      PT SHORT TERM GOAL #2   Title  knee pain with daily tasks decreased to moderate level due to improved mobility    Baseline  1-2/10    Status  Achieved        PT Long Term Goals - 05/25/19 1024      PT LONG TERM GOAL #1   Title  independent with HEP and understand how to progress correctly    Baseline  independent in current HEP    Time  8    Period  Weeks    Status  On-going      PT LONG TERM GOAL #2   Title  walking with less knee pain for 1 hour in the store due to improved flexibility and strength    Baseline  able to walk 30 minutes    Time  8    Period  Weeks    Status  On-going       PT LONG TERM GOAL #3   Title  understand ways to manage pain in bilateral knees with flexibility exericses, ice, and rest    Status  Achieved      PT LONG TERM GOAL #5   Title  knee pain with daily activities decreased to minimal to moderate due to increased overall function    Baseline  Lt knee 3/10 with activity    Time  8    Period  Weeks    Status  On-going            Plan - 06/06/19 1109    Clinical Impression Statement  Pt with flare-up today of bil knee pain due to rainy weather.  Pt had 10/10 pain at night and took Advil which helped to reduce the pain.  Overall, pt has reduced pain in bil knees.  Pt is independent and compliant in HEP for strength and flexibility.  Pt requires monitoring during the session for pain with activity.  Pt will attend 2 more sessions to finalize HEP.    PT Frequency  2x / week    PT Duration  8 weeks    PT Treatment/Interventions  Cryotherapy;Electrical Stimulation;Iontophoresis 4mg /ml Dexamethasone;Moist Heat;Ultrasound;Therapeutic exercise;Therapeutic activities;Functional mobility training;Stair training;Neuromuscular re-education;Patient/family education;Gait training;Dry needling;Passive range of motion;Manual techniques;Taping    PT Next Visit Plan  quad and hip strength.  Balance and endurance tasks. 1 more patch to Rt knee if needed    PT Home Exercise Plan  Access Code: [email or web address]    Consulted and Agree with Plan of Care  Patient       Patient will benefit from skilled therapeutic intervention in order to improve the following deficits and impairments:  Difficulty walking, Decreased endurance, Decreased activity tolerance, Pain, Impaired flexibility, Decreased strength  Visit Diagnosis: Acute pain of left knee  Acute pain of right knee  Muscle weakness (generalized)     Problem List Patient Active Problem List   Diagnosis Date Noted  . Coronary artery disease involving native coronary artery of native heart without angina  pectoris 08/14/2018  . Cardiomyopathy ([place])   . NSTEMI (non-ST elevated myocardial infarction) ([place]) 04/24/2018  . Essential hypertension 04/24/2018  . Hyperlipidemia with target LDL less than 70 04/24/2018  . Inguinal hernia 01/14/2011    [name], PT 06/06/19 11:43 AM  [facility] [address] [place], [place], [address] Phone: [phone]   Fax:  [phone]  Name: Julie Petty MRN: [id number] Date of Birth: Dec 31, 1931

## 2019-06-08 ENCOUNTER — Ambulatory Visit: Payer: Medicare Other

## 2019-06-08 ENCOUNTER — Other Ambulatory Visit: Payer: Self-pay

## 2019-06-08 DIAGNOSIS — M25561 Pain in right knee: Secondary | ICD-10-CM | POA: Diagnosis not present

## 2019-06-08 DIAGNOSIS — M6281 Muscle weakness (generalized): Secondary | ICD-10-CM

## 2019-06-08 DIAGNOSIS — M25562 Pain in left knee: Secondary | ICD-10-CM

## 2019-06-08 NOTE — Therapy (Signed)
[facility] [address] [place], [place], [address] Phone: [phone]   Fax:  [phone]  Physical Therapy Treatment  Patient Details  Name: Julie Petty MRN: [id number] Date of Birth: [birth year] Referring Provider (PT): Dr. Gaynelle Arabian   Encounter Date: 06/08/2019  PT End of Session - 06/08/19 1141    Visit Number  14    Date for PT Re-Evaluation  06/13/19    Authorization Type  [employer] medicare    PT Start Time  1103    PT Stop Time  1141    PT Time Calculation (min)  38 min    Activity Tolerance  Patient tolerated treatment well    Behavior During Therapy  [facility] for tasks assessed/performed       Past Medical History:  Diagnosis Date  . Complication of anesthesia   . Essential hypertension   . GERD (gastroesophageal reflux disease)   . Glaucoma   . Hernia   . Hyperlipemia   . Myocardial infarction ([place])    NSTEMI 04/24/18 (40% D2 by LHC; Dx: Takotsubo CM vs coronary spasm vs myocarditis)  . PONV (postoperative nausea and vomiting)     Past Surgical History:  Procedure Laterality Date  . CARDIAC CATHETERIZATION  04/05/03  . CHOLECYSTECTOMY N/A 08/17/2018   Procedure: Laparoscopic Cholecystectomy;  Surgeon: Erroll Luna, MD;  Location: [place];  Service: General;  Laterality: N/A;  . KNEE ARTHROSCOPY  09/17/05   left  . LEFT HEART CATH AND CORONARY ANGIOGRAPHY N/A 04/26/2018   Procedure: LEFT HEART CATH AND CORONARY ANGIOGRAPHY;  Surgeon: Leonie Man, MD;  Location: [place] [facility];  Service: Cardiovascular;  Laterality: N/A;  . VAGINAL HYSTERECTOMY  1980's    There were no vitals filed for this visit.  Subjective Assessment - 06/08/19 1106    Subjective  I am feeling much better today.  No pain.    Currently in Pain?  No/denies                       [facility] Adult PT Treatment/Exercise - 06/08/19 0001      Knee/Hip Exercises: Aerobic   Nustep  L2 x 10 min, PT present to  discuss status      Knee/Hip Exercises: Standing   Heel Raises  Both;1 set;20 reps   on black pad   Lateral Step Up  Right;2 sets;10 reps    Step Down  Both;2 sets;10 reps    Rebounder  weight shifting 3 ways x 1 minute each    Other Standing Knee Exercises  green band side stepping 4x 15 ft SBA, VC for alignment LTLE      Knee/Hip Exercises: Seated   Long Arc Quad  Strengthening;Both;2 sets;10 reps;Weights    Long Arc Quad Weight  3 lbs.    Marching  Strengthening;Both;1 set;20 reps;Weights    Marching Limitations  3# added               PT Short Term Goals - 05/09/19 1026      PT SHORT TERM GOAL #2   Title  knee pain with daily tasks decreased to moderate level due to improved mobility    Baseline  1-2/10    Status  Achieved        PT Long Term Goals - 05/25/19 1024      PT LONG TERM GOAL #1   Title  independent with HEP and understand how to progress correctly  Baseline  independent in current HEP    Time  8    Period  Weeks    Status  On-going      PT LONG TERM GOAL #2   Title  walking with less knee pain for 1 hour in the store due to improved flexibility and strength    Baseline  able to walk 30 minutes    Time  8    Period  Weeks    Status  On-going      PT LONG TERM GOAL #3   Title  understand ways to manage pain in bilateral knees with flexibility exericses, ice, and rest    Status  Achieved      PT LONG TERM GOAL #5   Title  knee pain with daily activities decreased to minimal to moderate due to increased overall function    Baseline  Lt knee 3/10 with activity    Time  8    Period  Weeks    Status  On-going            Plan - 06/08/19 1114    Clinical Impression Statement  Pt without pain today and was able to participate in higher level activities.  Pt requires verbal cues for alignment of hips with side stepping and step-down exercises today.  Pt denies any increased pain with exercise today.  Pt with bil knee OA and will continue  to benefit from skilled PT for strength, flexibility and endurance to improve function and sleep.    PT Frequency  2x / week    PT Duration  8 weeks    PT Treatment/Interventions  Cryotherapy;Electrical Stimulation;Iontophoresis 4mg /ml Dexamethasone;Moist Heat;Ultrasound;Therapeutic exercise;Therapeutic activities;Functional mobility training;Stair training;Neuromuscular re-education;Patient/family education;Gait training;Dry needling;Passive range of motion;Manual techniques;Taping    PT Next Visit Plan  quad and hip strength.  Balance and endurance tasks. 1 more patch to Rt knee if needed    PT Home Exercise Plan  Access Code: [email or web address]    Consulted and Agree with Plan of Care  Patient       Patient will benefit from skilled therapeutic intervention in order to improve the following deficits and impairments:  Difficulty walking, Decreased endurance, Decreased activity tolerance, Pain, Impaired flexibility, Decreased strength  Visit Diagnosis: Acute pain of left knee  Acute pain of right knee  Muscle weakness (generalized)     Problem List Patient Active Problem List   Diagnosis Date Noted  . Coronary artery disease involving native coronary artery of native heart without angina pectoris 08/14/2018  . Cardiomyopathy ([place])   . NSTEMI (non-ST elevated myocardial infarction) ([place]) 04/24/2018  . Essential hypertension 04/24/2018  . Hyperlipidemia with target LDL less than 70 04/24/2018  . Inguinal hernia 01/14/2011     [name], PT 06/08/19 11:43 AM  [facility] [address] [place], [place], [address] Phone: [phone]   Fax:  [phone]  Name: Julie Petty MRN: [id number] Date of Birth: January 05, 1932

## 2019-06-13 ENCOUNTER — Other Ambulatory Visit: Payer: Self-pay

## 2019-06-13 ENCOUNTER — Ambulatory Visit: Payer: Medicare Other

## 2019-06-13 DIAGNOSIS — M25562 Pain in left knee: Secondary | ICD-10-CM

## 2019-06-13 DIAGNOSIS — M25561 Pain in right knee: Secondary | ICD-10-CM

## 2019-06-13 DIAGNOSIS — M6281 Muscle weakness (generalized): Secondary | ICD-10-CM

## 2019-06-13 NOTE — Therapy (Signed)
[facility] [address] [place], [place], [address] Phone: [phone]   Fax:  [phone]  Physical Therapy Treatment  Patient Details  Name: Julie Petty MRN: [id number] Date of Birth: 05-12-1932 Referring Provider (PT): Dr. Gaynelle Arabian   Encounter Date: 06/13/2019  PT End of Session - 06/13/19 1141    Visit Number  15    PT Start Time  1101    PT Stop Time  1141    PT Time Calculation (min)  40 min    Activity Tolerance  Patient tolerated treatment well    Behavior During Therapy  [facility] for tasks assessed/performed       Past Medical History:  Diagnosis Date  . Complication of anesthesia   . Essential hypertension   . GERD (gastroesophageal reflux disease)   . Glaucoma   . Hernia   . Hyperlipemia   . Myocardial infarction ([place])    NSTEMI 04/24/18 (40% D2 by LHC; Dx: Takotsubo CM vs coronary spasm vs myocarditis)  . PONV (postoperative nausea and vomiting)     Past Surgical History:  Procedure Laterality Date  . CARDIAC CATHETERIZATION  04/05/03  . CHOLECYSTECTOMY N/A 08/17/2018   Procedure: Laparoscopic Cholecystectomy;  Surgeon: Erroll Luna, MD;  Location: [place];  Service: General;  Laterality: N/A;  . KNEE ARTHROSCOPY  09/17/05   left  . LEFT HEART CATH AND CORONARY ANGIOGRAPHY N/A 04/26/2018   Procedure: LEFT HEART CATH AND CORONARY ANGIOGRAPHY;  Surgeon: Leonie Man, MD;  Location: [place] [facility];  Service: Cardiovascular;  Laterality: N/A;  . VAGINAL HYSTERECTOMY  1980's    There were no vitals filed for this visit.  Subjective Assessment - 06/13/19 1105    Subjective  I strained my Rt leg and it is bothering me a little bit.  I am ready to discharge to HEP.  I am going to try to get the gel injections in my knees.    Currently in Pain?  No/denies         [facility] PT Assessment - 06/13/19 0001      Assessment   Medical Diagnosis  M17.11 Unilateral primary osteoarthritis,  right knee; bilateral knee pain    Referring Provider (PT)  Dr. Pilar Plate Aluisio    Onset Date/Surgical Date  02/22/19      Prior Function   Level of Independence  Independent    Vocation  Retired      Observation/Other Assessments   Focus on Therapeutic Outcomes (FOTO)   28% limitation      Strength   Right Knee Flexion  5/5    Right Knee Extension  5/5    Left Knee Flexion  5/5    Left Knee Extension  5/5                   [facility] Adult PT Treatment/Exercise - 06/13/19 0001      Knee/Hip Exercises: Aerobic   Nustep  L2 x 10 min, PT present to discuss status      Knee/Hip Exercises: Standing   Heel Raises  Both;1 set;20 reps   on black pad   Lateral Step Up  Right;2 sets;10 reps    Rebounder  weight shifting 3 ways x 1 minute each      Knee/Hip Exercises: Seated   Long Arc Quad  Strengthening;Both;2 sets;10 reps;Weights    Long Arc Quad Weight  3 lbs.    Marching  Strengthening;Both;1 set;20 reps;Weights    Marching Limitations  3# added               PT Short Term Goals - 05/09/19 1026      PT SHORT TERM GOAL #2   Title  knee pain with daily tasks decreased to moderate level due to improved mobility    Baseline  1-2/10    Status  Achieved        PT Long Term Goals - 06/13/19 1108      PT LONG TERM GOAL #1   Title  independent with HEP and understand how to progress correctly    Status  Achieved      PT LONG TERM GOAL #2   Title  walking with less knee pain for 1 hour in the store due to improved flexibility and strength    Baseline  30-40 minutes    Status  Partially Met      PT LONG TERM GOAL #3   Title  understand ways to manage pain in bilateral knees with flexibility exericses, ice, and rest    Status  Achieved      PT LONG TERM GOAL #4   Title  FOTO score </= 29% limitation from 44% limitation    Baseline  28% limitation    Status  Achieved      PT LONG TERM GOAL #5   Title  knee pain with daily activities decreased to minimal  to moderate due to increased overall function    Status  Achieved            Plan - 06/13/19 1124    Clinical Impression Statement  Pt is ready to D/C to HEP.  Pt is now able to walk 30-40 minutes without limitation.  FOTO is improved to 28% limitation. Strength in bil knees is 5/5 today.  Pt with intermittent Lt knee pain that is worse at night.  Pt requires stand by assistance for safety and for adjustments to technique.  Pt has HEP in place to address strength, stability and endurance and will continue with this to address bil knee OA.    PT Next Visit Plan  D/C PT to HEP today    PT Home Exercise Plan  Access Code: [facility]    Consulted and Agree with Plan of Care  Patient       Patient will benefit from skilled therapeutic intervention in order to improve the following deficits and impairments:     Visit Diagnosis: Acute pain of right knee  Acute pain of left knee  Muscle weakness (generalized)     Problem List Patient Active Problem List   Diagnosis Date Noted  . Coronary artery disease involving native coronary artery of native heart without angina pectoris 08/14/2018  . Cardiomyopathy ([place])   . NSTEMI (non-ST elevated myocardial infarction) ([place]) 04/24/2018  . Essential hypertension 04/24/2018  . Hyperlipidemia with target LDL less than 70 04/24/2018  . Inguinal hernia 01/14/2011   PHYSICAL THERAPY DISCHARGE SUMMARY  Visits from Start of Care: 15  Current functional level related to goals / functional outcomes: See above for current status.     Remaining deficits: Intermittent knee pain mostly at night.  Pt has HEP in place to address remaining deficits.     Education / Equipment: HEP Plan: Patient agrees to discharge.  Patient goals were partially met. Patient is being discharged due to being pleased with the current functional level.  ?????         [name], PT 06/13/19 11:43 AM  [facility] [address] [place], [place], [address] Phone: [phone]   Fax:  [phone]  Name: Julie Petty MRN: [id number] Date of Birth: 10/14/1931

## 2019-07-12 ENCOUNTER — Other Ambulatory Visit: Payer: Medicare Other

## 2019-07-17 ENCOUNTER — Ambulatory Visit: Payer: Medicare Other | Attending: Internal Medicine

## 2019-07-17 DIAGNOSIS — Z23 Encounter for immunization: Secondary | ICD-10-CM | POA: Insufficient documentation

## 2019-07-17 NOTE — Progress Notes (Signed)
   Covid-19 Vaccination Clinic  Name:  Julie Petty    MRN: [id number] DOB: 03-14-1932  07/17/2019  [name] was observed post Covid-19 immunization for 15 minutes without incidence. She was provided with Vaccine Information Sheet and instruction to access the V-Safe system.   [name] was instructed to call 911 with any severe reactions post vaccine: Marland Kitchen Difficulty breathing  . Swelling of your face and throat  . A fast heartbeat  . A bad rash all over your body  . Dizziness and weakness    Immunizations Administered    Name Date Dose VIS Date Route   Pfizer COVID-19 Vaccine 07/17/2019  1:09 PM 0.3 mL 06/03/2019 Intramuscular   Manufacturer: [place]   Lot: [id number]   [place]: [id number]

## 2019-08-08 ENCOUNTER — Ambulatory Visit: Payer: Medicare Other | Attending: Internal Medicine

## 2019-08-08 DIAGNOSIS — Z23 Encounter for immunization: Secondary | ICD-10-CM | POA: Insufficient documentation

## 2019-08-08 NOTE — Progress Notes (Signed)
   Covid-19 Vaccination Clinic  Name:  Julie Petty    MRN: [id number] DOB: 07/16/1931  08/08/2019  [name] was observed post Covid-19 immunization for 15 minutes without incidence. She was provided with Vaccine Information Sheet and instruction to access the V-Safe system.   [name] was instructed to call 911 with any severe reactions post vaccine: Marland Kitchen Difficulty breathing  . Swelling of your face and throat  . A fast heartbeat  . A bad rash all over your body  . Dizziness and weakness    Immunizations Administered    Name Date Dose VIS Date Route   Pfizer COVID-19 Vaccine 08/08/2019 10:59 AM 0.3 mL 06/03/2019 Intramuscular   Manufacturer: [place]   Lot: [id number]   [place]: [id number]

## 2019-12-07 IMAGING — MG DIGITAL DIAGNOSTIC BILATERAL MAMMOGRAM WITH TOMO AND CAD
8 series · 8 of 24 positions shown · non-contrast
Comparison: Previous exam(s).

CLINICAL DATA: 87-year-old female with nonfocal pain throughout the
right breast.

EXAM:
DIGITAL DIAGNOSTIC BILATERAL MAMMOGRAM WITH CAD AND TOMO

[R CC synth-2D]
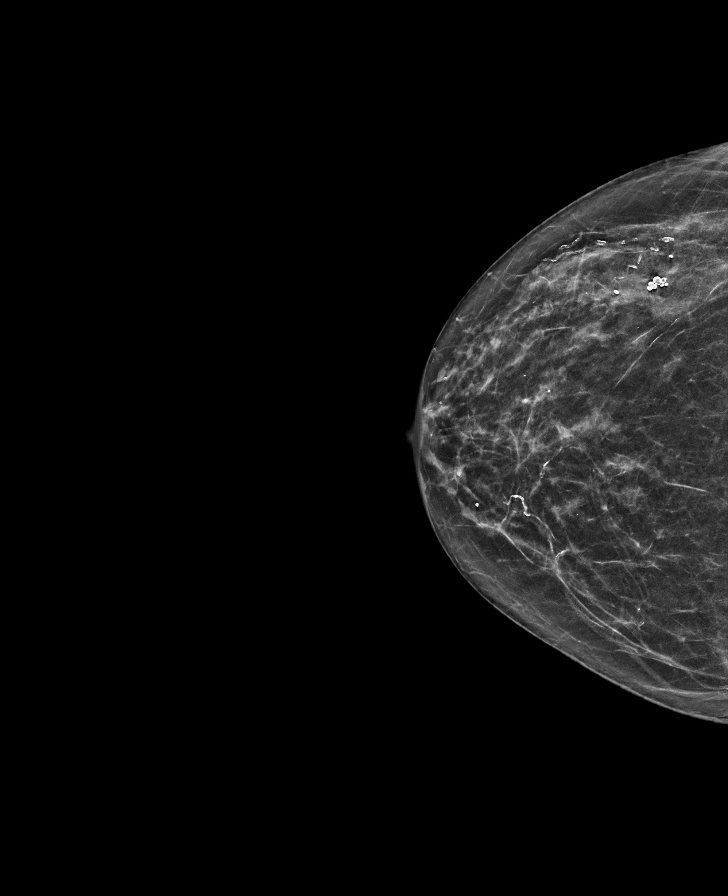

[R MLO synth-2D]
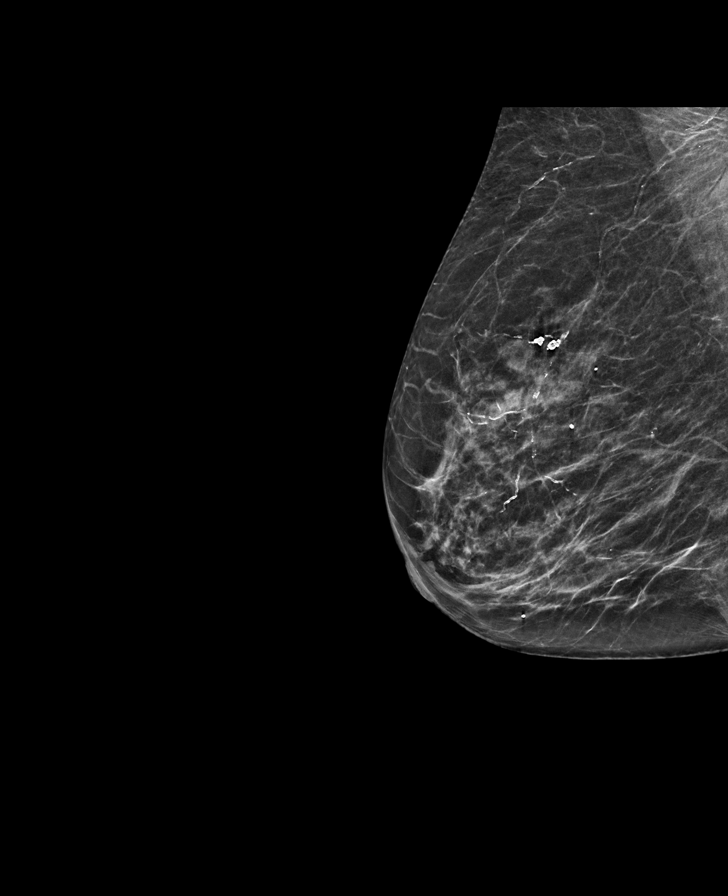

[L MLO synth-2D]
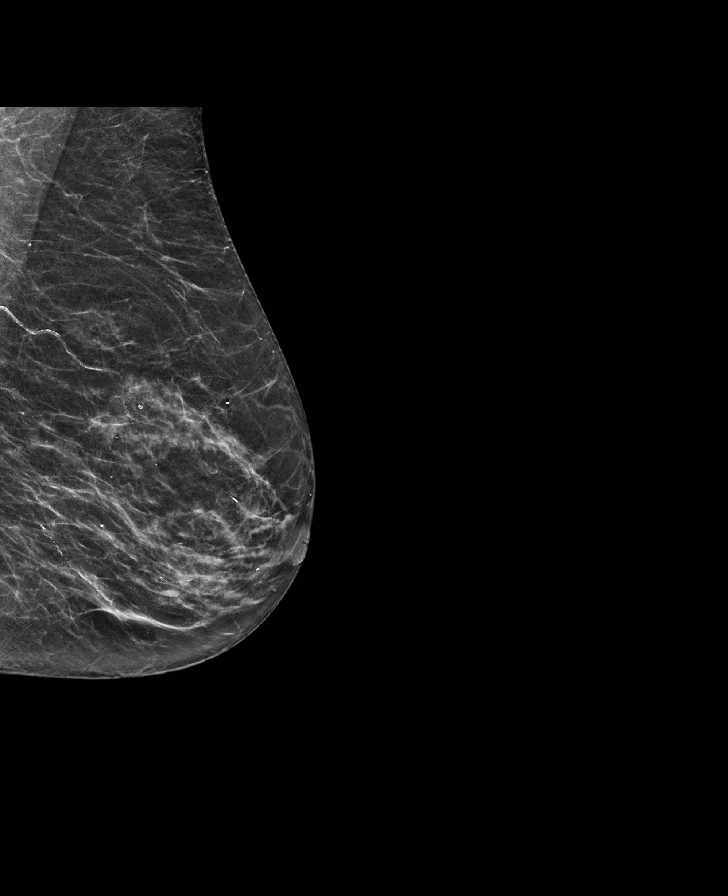

[L CC synth-2D]
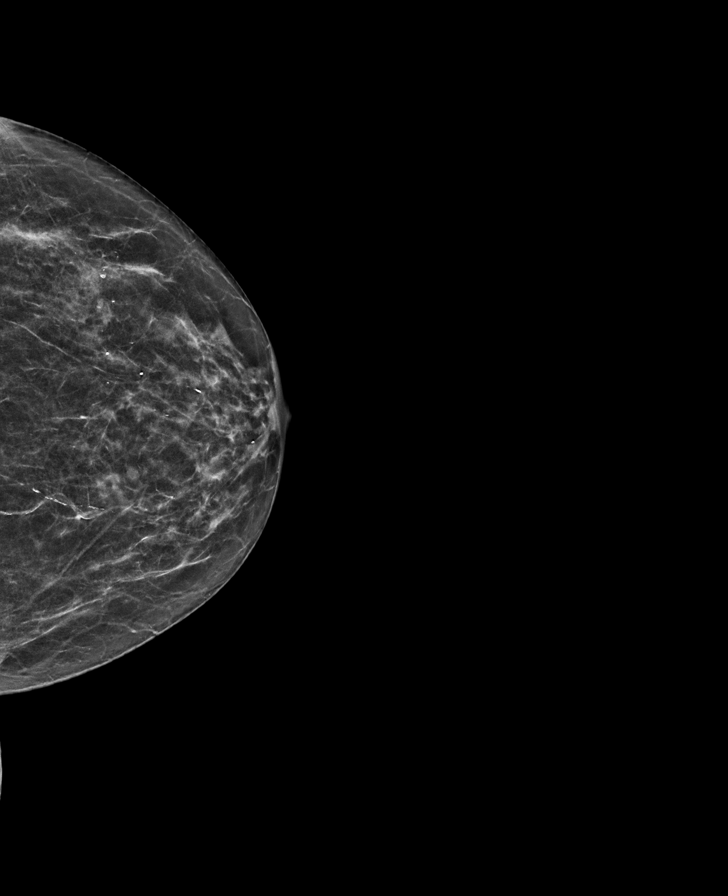

[R MLO tomo · tomo slice 29/57.0]
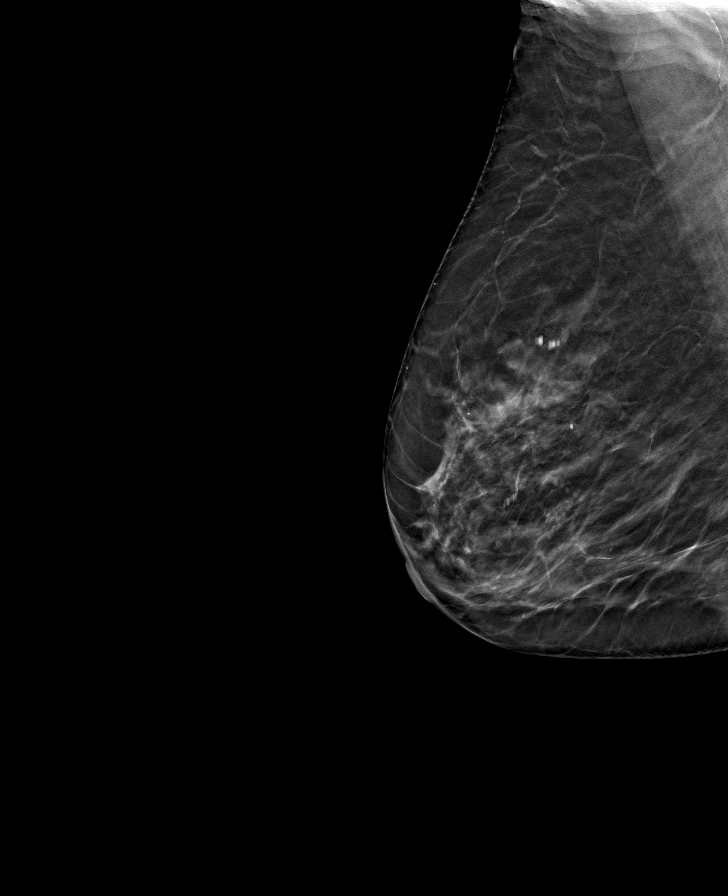

[R CC tomo · tomo slice 29/58.0]
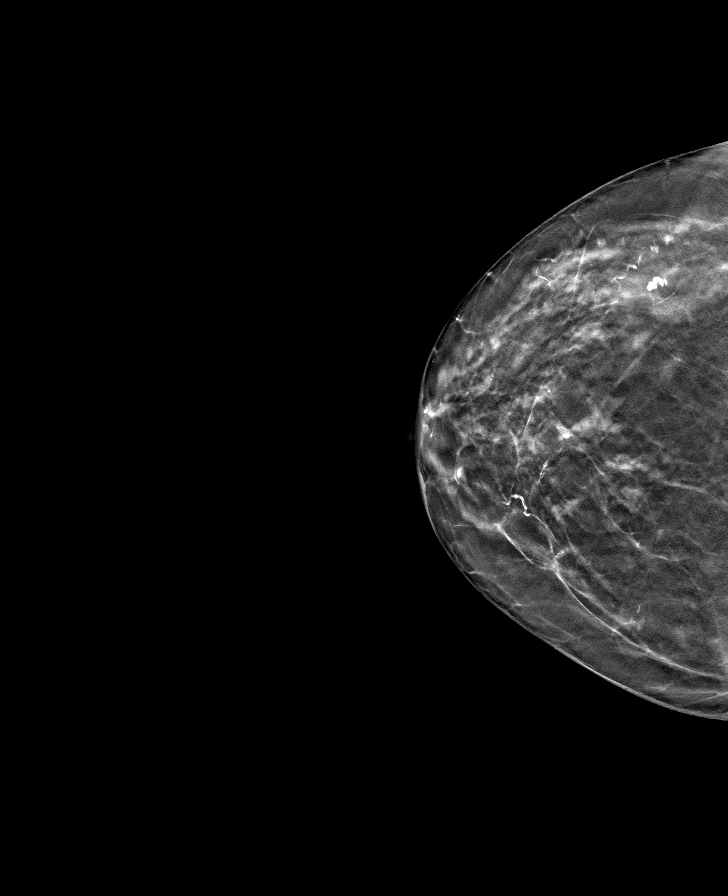

[L CC tomo · tomo slice 27/53.0]
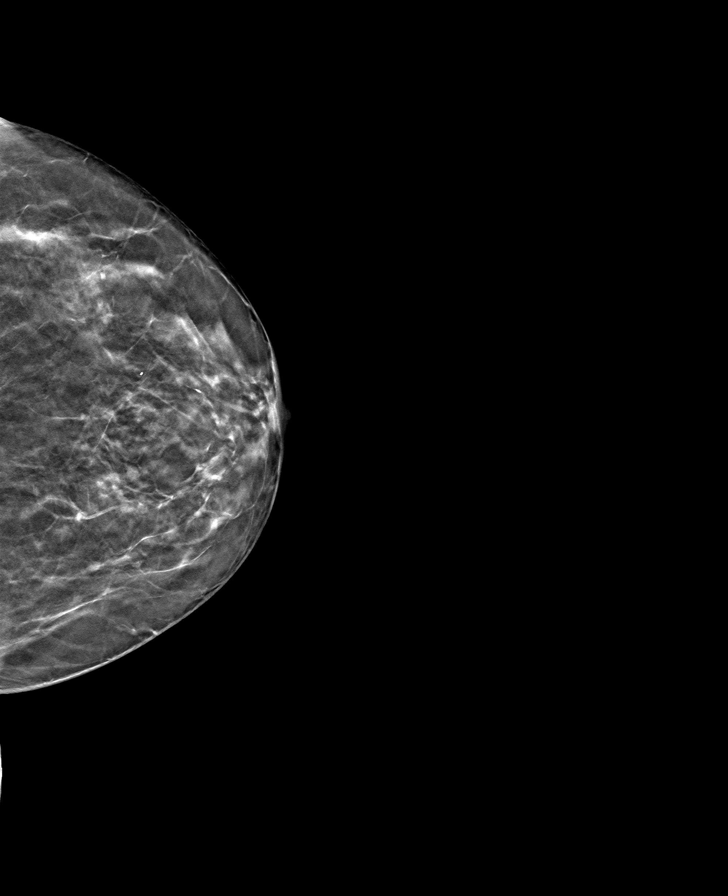

[L MLO tomo · tomo slice 29/57.0]
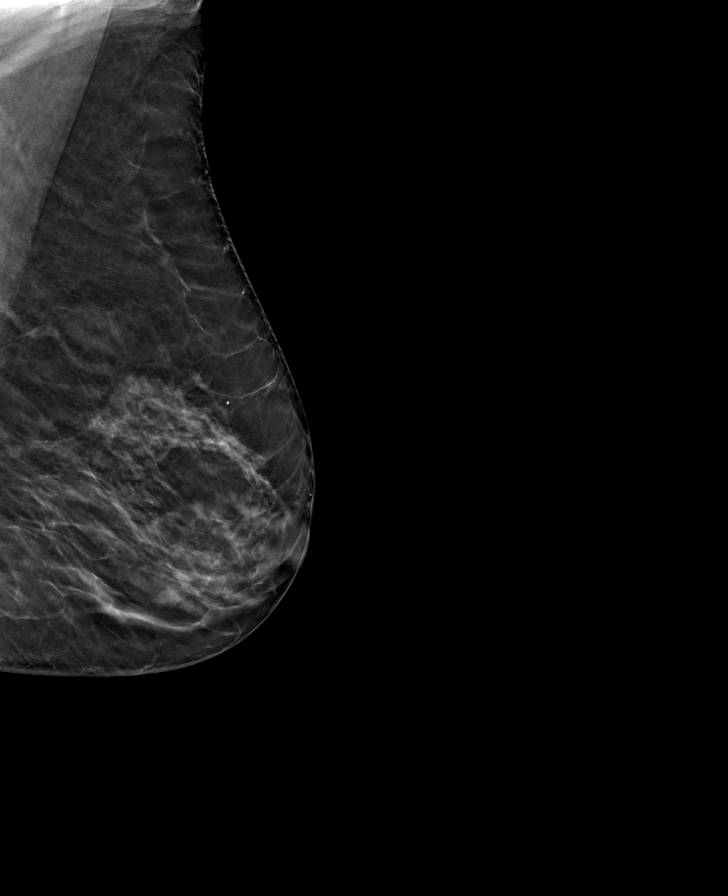

[8 of 24 positions shown; findings below may reference images not displayed]

ACR Breast Density Category c: The breast tissue is heterogeneously
dense, which may obscure small masses.
FINDINGS: No suspicious masses or calcifications are seen in either breast.
There is no mammographic evidence of malignancy in either breast.

Mammographic images were processed with CAD.
IMPRESSION: No mammographic evidence of malignancy in either breast.

RECOMMENDATION:
1. Recommend further management of nonfocal right breast pain be
based on clinical assessment.

2.  Screening mammogram in one year.(Code:[id number])

I have discussed the findings and recommendations with the patient.
Results were also provided in writing at the conclusion of the
visit. If applicable, a reminder letter will be sent to the patient
regarding the next appointment.

BI-RADS CATEGORY  1: Negative.

## 2019-12-21 ENCOUNTER — Encounter (HOSPITAL_COMMUNITY): Payer: Self-pay | Admitting: Pediatrics

## 2019-12-21 ENCOUNTER — Telehealth: Payer: Self-pay | Admitting: Cardiovascular Disease

## 2019-12-21 ENCOUNTER — Emergency Department (HOSPITAL_COMMUNITY): Payer: Medicare Other

## 2019-12-21 ENCOUNTER — Other Ambulatory Visit: Payer: Self-pay

## 2019-12-21 ENCOUNTER — Emergency Department (HOSPITAL_COMMUNITY)
Admission: EM | Admit: 2019-12-21 | Discharge: 2019-12-21 | Disposition: A | Discharge status: Home / Self Care | Payer: Medicare Other | Attending: Emergency Medicine | Admitting: Emergency Medicine

## 2019-12-21 DIAGNOSIS — I2510 Atherosclerotic heart disease of native coronary artery without angina pectoris: Secondary | ICD-10-CM | POA: Insufficient documentation

## 2019-12-21 DIAGNOSIS — I10 Essential (primary) hypertension: Secondary | ICD-10-CM | POA: Diagnosis not present

## 2019-12-21 DIAGNOSIS — R079 Chest pain, unspecified: Secondary | ICD-10-CM

## 2019-12-21 DIAGNOSIS — Z79899 Other long term (current) drug therapy: Secondary | ICD-10-CM | POA: Diagnosis not present

## 2019-12-21 DIAGNOSIS — R0789 Other chest pain: Secondary | ICD-10-CM | POA: Diagnosis not present

## 2019-12-21 LAB — BASIC METABOLIC PANEL
Anion gap: 10 (ref 5–15)
BUN: 24 mg/dL — ABNORMAL HIGH (ref 8–23)
CO2: 22 mmol/L (ref 22–32)
Calcium: 9.7 mg/dL (ref 8.9–10.3)
Chloride: 105 mmol/L (ref 98–111)
Creatinine, Ser: 1.05 mg/dL — ABNORMAL HIGH (ref 0.44–1.00)
GFR calc Af Amer: 56 mL/min — ABNORMAL LOW (ref >60)
GFR calc non Af Amer: 48 mL/min — ABNORMAL LOW (ref >60)
Glucose, Bld: 112 mg/dL — ABNORMAL HIGH (ref 70–99)
Potassium: 4.2 mmol/L (ref 3.5–5.1)
Sodium: 137 mmol/L (ref 135–145)

## 2019-12-21 LAB — CBC
HCT: 41.2 % (ref 36.0–46.0)
Hemoglobin: 13 g/dL (ref 12.0–15.0)
MCH: 32.3 pg (ref 26.0–34.0)
MCHC: 31.6 g/dL (ref 30.0–36.0)
MCV: 102.2 fL — ABNORMAL HIGH (ref 80.0–100.0)
Platelets: 209 10*3/uL (ref 150–400)
RBC: 4.03 MIL/uL (ref 3.87–5.11)
RDW: 12.6 % (ref 11.5–15.5)
WBC: 5.8 10*3/uL (ref 4.0–10.5)
nRBC: 0 % (ref 0.0–0.2)

## 2019-12-21 LAB — TROPONIN I (HIGH SENSITIVITY)
Troponin I (High Sensitivity): 3 ng/L (ref <18)
Troponin I (High Sensitivity): 4 ng/L (ref <18)

## 2019-12-21 MED ORDER — SODIUM CHLORIDE 0.9% FLUSH: 3.0000 mL | Freq: Once | INTRAVENOUS | Status: DC

## 2019-12-21 NOTE — Discharge Instructions (Signed)
Return for recurrent or persistent symptoms. Follow up with Dr. Loletha Grayer.

## 2019-12-21 NOTE — ED Triage Notes (Signed)
Patient arrived via POV; c/o midsternum chest pain started around 9 am while walking in the yard. Patient stated she took some tums and had some relief. Pt c/o generalized weakness the last few weeks.

## 2019-12-21 NOTE — ED Notes (Signed)
Family asking how long is the wait  Slow movement  Not too long  Not many discharges

## 2019-12-21 NOTE — Consult Note (Signed)
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  Mrs. Serpas reports that around 9:00 this morning she experienced acute onset 6/10 severity central nonradiating chest pain that was initially unrelieved with 4 TUMS.  She waited around 30 minutes and then took 2 sublingual nitroglycerin which she had from her prior stress-induced cardiomyopathy.  She noticed that around 15 to 20 minutes later the chest pain resolved.  This chest pain was different than her prior episode in 2019 when she had central, very severe radiating chest pain to her neck.  At that time coronary evaluation revealed nonobstructive CAD with echo consistent with possible stress-induced cardiomyopathy.  Ms. Mckibbin is extremely active for her age and participates in gym classes 3 times weekly along with Silver sneakers weight classes twice weekly and yoga at least once weekly.  She lives alone, is fully functional, and still drives herself to see friends.  She denies any recent shortness of breath, dyspnea on exertion, orthopnea, lower extremity swelling or PND.  She has had similar chest pain in the past which was relieved by Tylenol's and for which she attributed to GERD.  Her concern at this time was that the chest pain did not resolve as quickly as it normally does.  She has not been eating differently than normal but she says she does sometimes get reflux prior to breakfast.  Her coronary risk factors include  prior nonobstructive CAD and family history of premature presented sudden cardiac death in her brother at age 84.  Her brother had no known risk factors for CAD.  Mrs. Luckow denies any history of tobacco use, is not overweight, has no history of diabetes.  Past Medical History:  Diagnosis Date  . Complication of anesthesia   . Essential hypertension   . GERD (gastroesophageal reflux disease)   . Glaucoma   . Hernia   . Hyperlipemia   . Myocardial infarction ([place])    NSTEMI 04/24/18 (40% D2 by LHC; Dx: Takotsubo CM vs coronary spasm vs myocarditis)  . PONV (postoperative nausea and vomiting)    Past Surgical History:  Procedure Laterality Date  . CARDIAC CATHETERIZATION  04/05/03  . CHOLECYSTECTOMY N/A 08/17/2018   Procedure: Laparoscopic Cholecystectomy;  Surgeon: Erroll Luna, MD;  Location: [place];  Service: General;  Laterality: N/A;  . KNEE ARTHROSCOPY  09/17/05   left  . LEFT HEART CATH AND CORONARY ANGIOGRAPHY N/A 04/26/2018   Procedure: LEFT HEART CATH AND CORONARY ANGIOGRAPHY;  Surgeon: Leonie Man, MD;  Location: [place] [facility];  Service: Cardiovascular;  Laterality: N/A;  . VAGINAL HYSTERECTOMY  1980's    Home Medications:  Prior to Admission medications   Medication Sig Start Date End Date Taking? Authorizing Provider  amLODipine (NORVASC) 5 MG tablet Take 0.5 tablets (2.5 mg total) by mouth at bedtime. 05/07/18   Almyra Deforest, PA  atorvastatin (LIPITOR) 20 MG tablet Take 1 tablet (20 mg total) by mouth every other day. 08/13/18 11/11/18  Croitoru, Mihai, MD  brinzolamide (AZOPT) 1 %  ophthalmic suspension Place 1 drop into both eyes 2 (two) times daily.     [provider]  Calcium Carb-Cholecalciferol (CALCIUM 600/VITAMIN D3 PO) Take 1 tablet by mouth every other day.    [provider]  calcium-vitamin D (OSCAL WITH D) 500-200 MG-UNIT tablet Take 1 tablet by mouth.    [provider]  HYDROcodone-acetaminophen (NORCO/VICODIN) 5-325 MG  tablet Take 1 tablet by mouth every 6 (six) hours as needed for moderate pain. Patient not taking: Reported on 01/07/2019 08/17/18   Erroll Luna, MD  metoprolol succinate (TOPROL-XL) 25 MG 24 hr tablet TAKE ONE-HALF TABLET BY  MOUTH DAILY 04/29/19   Almyra Deforest, PA  nitroGLYCERIN (NITROSTAT) 0.4 MG SL tablet Place 1 tablet (0.4 mg total) under the tongue every 5 (five) minutes x 3 doses as needed for chest pain. 04/28/18   Cheryln Manly, NP  triamcinolone (KENALOG) 0.025 % ointment Apply 1 application topically every other day.  03/22/18   [provider]    Inpatient Medications: Scheduled Meds: . sodium chloride flush  3 mL Intravenous Once   Continuous Infusions:  PRN Meds:  Allergies:    Allergies  Allergen Reactions  . Morphine And Related Other (See Comments)    Acid reflux, sick on stomach  . Codeine Nausea Only  . Sulfur Nausea Only    Social History:   Social History   Socioeconomic History  . Marital status: Divorced    Spouse name: Not on file  . Number of children: Not on file  . Years of education: Not on file  . Highest education level: Not on file  Occupational History  . Not on file  Tobacco Use  . Smoking status: Never Smoker  . Smokeless tobacco: Never Used  Vaping Use  . Vaping Use: Never used  Substance and Sexual Activity  . Alcohol use: No  . Drug use: Not on file  . Sexual activity: Not on file  Other Topics Concern  . Not on file  Social History Narrative  . Not on file   Social Determinants of Health   Financial Resource Strain:   . Difficulty of Paying Living Expenses:   Food Insecurity:   . Worried About Charity fundraiser in the Last Year:   . Arboriculturist in the Last Year:   Transportation Needs:   . Film/video editor (Medical):   Marland Kitchen Lack of Transportation (Non-Medical):   Physical Activity:   . Days of Exercise per Week:   . Minutes of Exercise per Session:   Stress:   . Feeling of Stress :   Social  Connections:   . Frequency of Communication with Friends and Family:   . Frequency of Social Gatherings with Friends and Family:   . Attends Religious Services:   . Active Member of Clubs or Organizations:   . Attends Archivist Meetings:   Marland Kitchen Marital Status:   Intimate Partner Violence:   . Fear of Current or Ex-Partner:   . Emotionally Abused:   Marland Kitchen Physically Abused:   . Sexually Abused:     Family History:   Brother died of presumed SCD at age 67 Dad with MI in 80s   Family History  Problem Relation Age of Onset  . Heart attack Brother     ROS:  Review of Systems: [y] = yes, [ ]  = no       General: Weight gain [ ] ; Weight loss [ ] ; Anorexia [ ] ; Fatigue [ ] ;  Fever [ ] ; Chills [ ] ; Weakness [ ]     Cardiac: Chest pain/pressure [y]; Resting SOB [ ] ; Exertional SOB [ ] ; Orthopnea [ ] ; Pedal Edema [ ] ; Palpitations [ ] ; Syncope [ ] ; Presyncope [ ] ; Paroxysmal nocturnal dyspnea [ ]     Pulmonary: Cough [ ] ; Wheezing [ ] ; Hemoptysis [ ] ; Sputum [ ] ; Snoring [ ]     GI: Vomiting [ ] ; Dysphagia [ ] ; Melena [ ] ; Hematochezia [ ] ; Heartburn [ ] ; Abdominal pain [ ] ; Constipation [ ] ; Diarrhea [ ] ; BRBPR [ ]     GU: Hematuria [ ] ; Dysuria [ ] ; Nocturia [ ]   Vascular: Pain in legs with walking [ ] ; Pain in feet with lying flat [ ] ; Non-healing sores [ ] ; Stroke [ ] ; TIA [ ] ; Slurred speech [ ] ;    Neuro: Headaches [ ] ; Vertigo [ ] ; Seizures [ ] ; Paresthesias [ ] ;Blurred vision [ ] ; Diplopia [ ] ; Vision changes [ ]     Ortho/Skin: Arthritis [ ] ; Joint pain [ ] ; Muscle pain [ ] ; Joint swelling [ ] ; Back Pain [ ] ; Rash [ ]     Psych: Depression [ ] ; Anxiety [ ]     Heme: Bleeding problems [ ] ; Clotting disorders [ ] ; Anemia [ ]     Endocrine: Diabetes [ ] ; Thyroid dysfunction [ ]    Physical Exam/Data:   Vitals:   12/21/19 1500 12/21/19 1721 12/21/19 2000 12/21/19 2137  BP: (!) 157/74 140/80 (!) 153/72 (!) 134/96  Pulse: (!) 54 (!) 59 (!) 43 (!) 58  Resp: 18 16 16 18   Temp:     98.3 F (36.8 C)  TempSrc:    Oral  SpO2: 98% 100% 98% 97%  Weight:      Height:       No intake or output data in the 24 hours ending 12/21/19 2304 Last 3 Weights 12/21/2019 08/17/2018 08/13/2018  Weight (lbs) 136 lb 132 lb 136 lb 12.8 oz  Weight (kg) 61.689 kg 59.875 kg 62.052 kg     Body mass index is 25.7 kg/m.  General:  Well nourished, well developed, in no acute distress HEENT: normal Lymph: no adenopathy Neck: no JVD Endocrine:  No thryomegaly Vascular: No carotid bruits; FA pulses 2+ bilaterally without bruits  Cardiac:  normal S1, S2; RRR; no murmur  Lungs:  clear to auscultation bilaterally, no wheezing, rhonchi or rales  Abd: soft, nontender, no hepatomegaly  Ext: no edema Musculoskeletal:  No deformities, BUE and BLE strength normal and equal Skin: warm and dry  Neuro:  CNs 2-12 intact, no focal abnormalities noted Psych:  Normal affect   EKG:  The EKG was personally reviewed and demonstrates: NSR with RBBB, no STE  Telemetry:  Telemetry was personally reviewed and demonstrates: NSR  Relevant CV Studies  Cors angio Result date: 2018-05-06 Minimal CAD, ostial D2 40%, normal LV filling pressures   TTE Result date: 04/25/18 LVEF 50-55%, LV fxn normal, distal anterior, apical and inferoapcial severe HK with hyperdynamic base, c/f Takatsubo vs LAD ischemia, no apical thrombus, grade I diastolic dysfunction, AV sclerosis, mild AR, triv MR, LA moderaly dilated, RA mildly dilated, mod TR, PA peak 37 mmHg, RA pressure estimated at 8 mmHg, no prior study for comparision  Laboratory Data:  High Sensitivity Troponin:   Recent Labs  Lab 12/21/19 1134 12/21/19 1847  TROPONINIHS 3 4     Chemistry Recent Labs  Lab 12/21/19 1134  NA 137  K 4.2  CL 105  CO2 22  GLUCOSE  112*  BUN 24*  CREATININE 1.05*  CALCIUM 9.7  GFRNONAA 48*  GFRAA 56*  ANIONGAP 10    No results for input(s): PROT, ALBUMIN, AST, ALT, ALKPHOS, BILITOT in the last 168 hours.    Hematology Recent Labs  Lab 12/21/19 1134  WBC 5.8  RBC 4.03  HGB 13.0  HCT 41.2  MCV 102.2*  MCH 32.3  MCHC 31.6  RDW 12.6  PLT 209   BNPNo results for input(s): BNP, PROBNP in the last 168 hours.  DDimer No results for input(s): DDIMER in the last 168 hours.  Radiology/Studies:  DG Chest 2 View  Result Date: 12/21/2019 CLINICAL DATA:  Chest pain. EXAM: CHEST - 2 VIEW COMPARISON:  04/24/2018 FINDINGS: The cardiac silhouette, mediastinal and hilar contours are within normal limits and stable. Mild tortuosity and calcification of the thoracic aorta. The lungs are clear. No infiltrates or effusions. No pulmonary lesions. The bony thorax is intact. IMPRESSION: No acute cardiopulmonary findings. Electronically Signed   By: Marijo Sanes M.D.   On: 12/21/2019 12:06   HEART score (3) for 1-2 RFs (early family hx, known CAD), age >85.  Assessment and Plan:   1. Atypical chest pain Mrs. Sarkisyan presented with chest pain similar to prior episodes of GERD and differing from prior episode of stress-induced cardiomyopathy.  Risk factors include nonobstructive CAD and presumed family history of SCD.  She is otherwise extremely healthy and active with no episodes of chest pain with activity for today.  Her chest pain started around 9 AM and troponins checked 1.5 hours and 8 hours later were both unremarkable.  ECG only notable for right bundle branch block.  Prior ECG with lateral T wave inversions in 2019 which have since resolved.  No active chest pain during exam.  Benard is comfortable following up as an outpatient and thinks this is most likely secondary to reflux.  We will help coordinate outpatient follow-up with primary cardiologist.  No medication changes prior to discharge.  Discussed with attending cardiologist on call.  [facility] will sign off.   Medication Recommendations:  No changes Other recommendations (labs, testing, etc): OP cards to see Follow up as an outpatient:  within 7 days, will coordinate   For questions or updates, please contact [facility] Please consult [email or web address] for contact info under   Signed, Dion Body, MD  12/21/2019 11:04 PM

## 2019-12-21 NOTE — Telephone Encounter (Addendum)
Spoke to pt who report she has been experiencing chest pain in between her breast area since 9 am this morning. She report she initial thought it was indigestion and took some tums with no relief. Pt also report she has taken 2 Nitro and symptoms are unchanged. Nurse advised pt to contact EMS or having someone drive her to the nearest emergency room. Pt verbalized understanding.    Trish notified.

## 2019-12-21 NOTE — Telephone Encounter (Signed)
° ° °  Pt c/o of Chest Pain: STAT if CP now or developed within 24 hours  1. Are you having CP right now? Yes, mild pain in the middle below pt's breast   2. Are you experiencing any other symptoms (ex. SOB, nausea, vomiting, sweating)? NO other symtomps  3. How long have you been experiencing CP? This morning  4. Is your CP continuous or coming and going? coming and going  5. Have you taken Nitroglycerin? Took 2   Pt took tums and 2 nitro and still feeling the pain

## 2019-12-21 NOTE — ED Provider Notes (Signed)
[place] EMERGENCY DEPARTMENT Provider Note   CSN: [id number] Arrival date & time: 12/21/19  1119     History Chief Complaint  Patient presents with  . Chest Pain    Julie Petty is a 86 y.o. female.  86 yo F with a chief complaint of chest pain.  This is left-sided described as uncomfortable.  No radiation.  Felt like it was in between her breast but she pointed more to the left side of her chest as the area of pain.  Denies shortness of breath no diaphoresis had some nausea and felt generally fatigued.  Patient has a history of a elevated troponin without significant coronary artery occlusion thought to be Takotsubo's cardiomyopathy.  She called her cardiologist who suggested she come in for evaluation.  She tried some Tums without improvement and then took a nitroglycerin and then about 30 minutes later had complete resolution of her pain.  Denies lower extremity edema denies hemoptysis denies cough congestion or fever.  Denies trauma to the chest.  When she had her pain nothing seemed to make it worse she walked around the house for a while without any significant change.  Denies history of PE or DVT.  She has a history of hypertension hyperlipidemia denies diabetes smoking.  She has had 2 family members that have had MIs though later in life.  The history is provided by the patient.  Chest Pain Pain location:  L chest Pain quality: aching   Pain radiates to:  Does not radiate Pain severity:  Moderate Onset quality:  Gradual Duration:  2 hours Timing:  Constant Progression:  Resolved Chronicity:  New Relieved by:  Nitroglycerin Worsened by:  Nothing Ineffective treatments:  None tried Associated symptoms: fatigue and nausea   Associated symptoms: no dizziness, no fever, no headache, no palpitations, no shortness of breath and no vomiting        Past Medical History:  Diagnosis Date  . Complication of anesthesia   . Essential hypertension   .  GERD (gastroesophageal reflux disease)   . Glaucoma   . Hernia   . Hyperlipemia   . Myocardial infarction ([place])    NSTEMI 04/24/18 (40% D2 by LHC; Dx: Takotsubo CM vs coronary spasm vs myocarditis)  . PONV (postoperative nausea and vomiting)     Patient Active Problem List   Diagnosis Date Noted  . Coronary artery disease involving native coronary artery of native heart without angina pectoris 08/14/2018  . Cardiomyopathy ([place])   . NSTEMI (non-ST elevated myocardial infarction) ([place]) 04/24/2018  . Essential hypertension 04/24/2018  . Hyperlipidemia with target LDL less than 70 04/24/2018  . Inguinal hernia 01/14/2011    Past Surgical History:  Procedure Laterality Date  . CARDIAC CATHETERIZATION  04/05/03  . CHOLECYSTECTOMY N/A 08/17/2018   Procedure: Laparoscopic Cholecystectomy;  Surgeon: Erroll Luna, MD;  Location: [place];  Service: General;  Laterality: N/A;  . KNEE ARTHROSCOPY  09/17/05   left  . LEFT HEART CATH AND CORONARY ANGIOGRAPHY N/A 04/26/2018   Procedure: LEFT HEART CATH AND CORONARY ANGIOGRAPHY;  Surgeon: Leonie Man, MD;  Location: [place] [facility];  Service: Cardiovascular;  Laterality: N/A;  . VAGINAL HYSTERECTOMY  1980's     OB History   No obstetric history on file.     Family History  Problem Relation Age of Onset  . Heart attack Brother     Social History   Tobacco Use  . Smoking status: Never Smoker  . Smokeless tobacco:  Never Used  Vaping Use  . Vaping Use: Never used  Substance Use Topics  . Alcohol use: No  . Drug use: Not on file    Home Medications Prior to Admission medications   Medication Sig Start Date End Date Taking? Authorizing Provider  amLODipine (NORVASC) 5 MG tablet Take 0.5 tablets (2.5 mg total) by mouth at bedtime. 05/07/18   Almyra Deforest, PA  atorvastatin (LIPITOR) 20 MG tablet Take 1 tablet (20 mg total) by mouth every other day. 08/13/18 11/11/18  Croitoru, Mihai, MD  brinzolamide (AZOPT) 1 % ophthalmic suspension  Place 1 drop into both eyes 2 (two) times daily.     [provider]  Calcium Carb-Cholecalciferol (CALCIUM 600/VITAMIN D3 PO) Take 1 tablet by mouth every other day.    [provider]  calcium-vitamin D (OSCAL WITH D) 500-200 MG-UNIT tablet Take 1 tablet by mouth.    [provider]  HYDROcodone-acetaminophen (NORCO/VICODIN) 5-325 MG tablet Take 1 tablet by mouth every 6 (six) hours as needed for moderate pain. Patient not taking: Reported on 01/07/2019 08/17/18   Erroll Luna, MD  metoprolol succinate (TOPROL-XL) 25 MG 24 hr tablet TAKE ONE-HALF TABLET BY  MOUTH DAILY 04/29/19   Almyra Deforest, PA  nitroGLYCERIN (NITROSTAT) 0.4 MG SL tablet Place 1 tablet (0.4 mg total) under the tongue every 5 (five) minutes x 3 doses as needed for chest pain. 04/28/18   Cheryln Manly, NP  triamcinolone (KENALOG) 0.025 % ointment Apply 1 application topically every other day.  03/22/18   [provider]    Allergies    Morphine and related, Codeine, and Sulfur  Review of Systems   Review of Systems  Constitutional: Positive for fatigue. Negative for chills and fever.  HENT: Negative for congestion and rhinorrhea.   Eyes: Negative for redness and visual disturbance.  Respiratory: Negative for shortness of breath and wheezing.   Cardiovascular: Positive for chest pain. Negative for palpitations.  Gastrointestinal: Positive for nausea. Negative for vomiting.  Genitourinary: Negative for dysuria and urgency.  Musculoskeletal: Negative for arthralgias and myalgias.  Skin: Negative for pallor and wound.  Neurological: Negative for dizziness and headaches.    Physical Exam Updated Vital Signs BP 140/80 (BP Location: Left Arm)   Pulse (!) 59   Temp 98.4 F (36.9 C) (Oral)   Resp 16   Ht 5\' 1"  (1.549 m)   Wt 61.7 kg   SpO2 100%   BMI 25.70 kg/m   Physical Exam Vitals and nursing note reviewed.  Constitutional:      General: She is not in acute distress.     Appearance: She is well-developed. She is not diaphoretic.  HENT:     Head: Normocephalic and atraumatic.  Eyes:     Pupils: Pupils are equal, round, and reactive to light.  Cardiovascular:     Rate and Rhythm: Normal rate and regular rhythm.     Heart sounds: No murmur heard.  No friction rub. No gallop.   Pulmonary:     Effort: Pulmonary effort is normal.     Breath sounds: No wheezing, rhonchi or rales.  Abdominal:     General: There is no distension.     Palpations: Abdomen is soft.     Tenderness: There is no abdominal tenderness.  Musculoskeletal:        General: No tenderness.     Cervical back: Normal range of motion and neck supple.  Skin:    General: Skin is warm and dry.  Neurological:     Mental Status: She is alert and oriented to person, place, and time.  Psychiatric:        Behavior: Behavior normal.     ED Results / Procedures / Treatments   Labs (all labs ordered are listed, but only abnormal results are displayed) Labs Reviewed  BASIC METABOLIC PANEL - Abnormal; Notable for the following components:      Result Value   Glucose, Bld 112 (*)    BUN 24 (*)    Creatinine, Ser 1.05 (*)    GFR calc non Af Amer 48 (*)    GFR calc Af Amer 56 (*)    All other components within normal limits  CBC - Abnormal; Notable for the following components:   MCV 102.2 (*)    All other components within normal limits  TROPONIN I (HIGH SENSITIVITY)  TROPONIN I (HIGH SENSITIVITY)    EKG EKG Interpretation  Date/Time:  Wednesday December 21 2019 17:49:25 EDT Ventricular Rate:  58 PR Interval:    QRS Duration: 123 QT Interval:  438 QTC Calculation: 431 R Axis:   -35 Text Interpretation: Sinus rhythm Right bundle branch block flipped t waves laterally resolved Otherwise no significant change Confirmed by Deno Etienne [phone]) on 12/21/2019 6:13:15 PM   Radiology DG Chest 2 View  Result Date: 12/21/2019 CLINICAL DATA:  Chest pain. EXAM: CHEST - 2 VIEW COMPARISON:   04/24/2018 FINDINGS: The cardiac silhouette, mediastinal and hilar contours are within normal limits and stable. Mild tortuosity and calcification of the thoracic aorta. The lungs are clear. No infiltrates or effusions. No pulmonary lesions. The bony thorax is intact. IMPRESSION: No acute cardiopulmonary findings. Electronically Signed   By: Marijo Sanes M.D.   On: 12/21/2019 12:06    Procedures Procedures (including critical care time)  Medications Ordered in ED Medications  sodium chloride flush (NS) 0.9 % injection 3 mL (3 mLs Intravenous Not Given 12/21/19 1840)    ED Course  I have reviewed the triage vital signs and the nursing notes.  Pertinent labs & imaging results that were available during my care of the patient were reviewed by me and considered in my medical decision making (see chart for details).    MDM Rules/Calculators/A&P                          86 yo F with a chief complaints of chest pain.  Started this morning when she woke up.  Resolved with nitroglycerin.  Has a history of coronary disease that was nonocclusive with maximum stenosis of 40%.  She is now pain-free.  Initial troponin was negative.  EKG without change.  Will discuss with cardiology.  [place] negative.  Cards fellow Dr. Ailene Ravel, down to see the patient at bedside.  Feels that she is low enough risk that she go home and follow-up as an outpatient.  9:16 PM:  I have discussed the diagnosis/risks/treatment options with the patient and family and believe the pt to be eligible for discharge home to follow-up with Cards. We also discussed returning to the ED immediately if new or worsening sx occur. We discussed the sx which are most concerning (e.g., sudden worsening pain, fever, inability to tolerate by mouth) that necessitate immediate return. Medications administered to the patient during their visit and any new prescriptions provided to the patient are listed below.  Medications given during this  visit Medications  sodium chloride flush (NS) 0.9 % injection 3 mL (  3 mLs Intravenous Not Given 12/21/19 1840)     The patient appears reasonably screen and/or stabilized for discharge and I doubt any other medical condition or other [facility] requiring further screening, evaluation, or treatment in the ED at this time prior to discharge.   Final Clinical Impression(s) / ED Diagnoses Final diagnoses:  Nonspecific chest pain    Rx / DC Orders ED Discharge Orders    None       Deno Etienne, DO 12/21/19 2116

## 2020-01-09 ENCOUNTER — Ambulatory Visit: Payer: Medicare Other | Admitting: Adult Health

## 2020-02-03 ENCOUNTER — Ambulatory Visit: Payer: Medicare Other | Admitting: Pulmonary Disease

## 2020-02-03 ENCOUNTER — Encounter: Payer: Self-pay | Admitting: Pulmonary Disease

## 2020-02-03 ENCOUNTER — Other Ambulatory Visit: Payer: Self-pay

## 2020-02-03 VITALS — BP 132/78 | HR 62 | Temp 97.7°F | Ht 61.0 in | Wt 137.0 lb

## 2020-02-03 DIAGNOSIS — R911 Solitary pulmonary nodule: Secondary | ICD-10-CM

## 2020-02-03 DIAGNOSIS — R059 Cough, unspecified: Secondary | ICD-10-CM

## 2020-02-03 DIAGNOSIS — R05 Cough: Secondary | ICD-10-CM | POA: Diagnosis not present

## 2020-02-03 NOTE — Patient Instructions (Signed)
Nice to meet you!  I will track down the most recetn CT from July and review it. Based on the CT scans I can see and the radiology report, I do not anticipate need for further CT scans. If this changes I will let you know.  Please call and make an appointment if your cough worsens or you develop any breathing troubles.   Follow up as needed.

## 2020-02-03 NOTE — Progress Notes (Addendum)
@Patient  ID: Julie Petty, female    DOB: 09-28-1931, 86 y.o.   MRN: [id number]  Chief Complaint  Patient presents with  . Consult    referred by Roe Coombs, PA, last CT chest showed mucus in lung, denies any problems    Referring provider: Aura Dials, PA-C  HPI:   Julie Petty is a 86 y.o. woman with possible history of hypertension who we are seen in consultation at the request of Mattie Marlin, [place] for evaluation of lung nodules.  Office notes from referring provider reviewed.  Patient is doing well.  She denies any significant cough first questioning.  No dyspnea or breathing issues at all.  We reviewed her CT scans dating back to 2020 which demonstrated my interpretation stable small sub-6 mm nodules most notably clustered in the periphery of the right middle lobe.  1 year follow-up CT scan 06/2019 unchanged.  Per Fleischner criteria this would be the end of surveillance given she is low risk and had multiple sub-6 mm nodules.  She received repeat CT chest via [facility] in July 2021.  Images not available to me at this time but radiology report reads as stable scattered small pulmonary nodules without recommendation for follow-up.  Also noted mild lingular right middle lobe bronchiectasis with associated mucoid impaction.  Upon my review there are similar very mild changes on prior CT scans as noted above.  When pressed further she does have a "morning cough".  Has some nasal congestion and subsequent cough with scant sputum production that clears up after an hour or so upon waking from bed.  No distressing cough during the day.  Not worse at night.  No clear exacerbating factors, no clear alleviating factors.  Severity described as mild.  Not worse when lying supine.  No weight loss, fever chills.  She lives alone near [place].  Never smoker.  PMH: Hypertension Surgical history: Cholecystectomy Family history: No family history of COPD or emphysema, no  first-degree relatives with lung cancer Social history: Never smoker, does not drink alcohol, lives alone in [place] near [employer] / Pulmonary Flowsheets:   ACT:  No flowsheet data found.  MMRC: No flowsheet data found.  Epworth:  No flowsheet data found.  Tests:   FENO:  No results found for: NITRICOXIDE  PFT: No flowsheet data found.  WALK:  No flowsheet data found.  Imaging: No results found.  Lab Results: Personally reviewed and as per EMR, eosinophils 200 CBC    Component Value Date/Time   WBC 5.8 12/21/2019 1134   RBC 4.03 12/21/2019 1134   HGB 13.0 12/21/2019 1134   HCT 41.2 12/21/2019 1134   PLT 209 12/21/2019 1134   MCV 102.2 (H) 12/21/2019 1134   MCH 32.3 12/21/2019 1134   MCHC 31.6 12/21/2019 1134   RDW 12.6 12/21/2019 1134   LYMPHSABS 1.1 08/11/2018 1045   MONOABS 0.5 08/11/2018 1045   EOSABS 0.2 08/11/2018 1045   BASOSABS 0.0 08/11/2018 1045    BMET    Component Value Date/Time   NA 137 12/21/2019 1134   K 4.2 12/21/2019 1134   CL 105 12/21/2019 1134   CO2 22 12/21/2019 1134   GLUCOSE 112 (H) 12/21/2019 1134   BUN 24 (H) 12/21/2019 1134   CREATININE 1.05 (H) 12/21/2019 1134   CALCIUM 9.7 12/21/2019 1134   GFRNONAA 48 (L) 12/21/2019 1134   GFRAA 56 (L) 12/21/2019 1134    BNP No results found for: BNP  ProBNP No  results found for: PROBNP  Specialty Problems    None      Allergies  Allergen Reactions  . Morphine And Related Other (See Comments)    Acid reflux, sick on stomach  . Codeine Nausea Only  . Sulfur Nausea Only    Immunization History  Administered Date(s) Administered  . Influenza, High Dose Seasonal PF 03/26/2019  . PFIZER SARS-COV-2 Vaccination 07/17/2019, 08/08/2019    Past Medical History:  Diagnosis Date  . Complication of anesthesia   . Essential hypertension   . GERD (gastroesophageal reflux disease)   . Glaucoma   . Hernia   . Hyperlipemia   . Myocardial infarction  ([place])    NSTEMI 04/24/18 (40% D2 by LHC; Dx: Takotsubo CM vs coronary spasm vs myocarditis)  . PONV (postoperative nausea and vomiting)     Tobacco History: Social History   Tobacco Use  Smoking Status Never Smoker  Smokeless Tobacco Never Used   Counseling given: Not Answered   Continue to not smoke  Outpatient Encounter Medications as of 02/03/2020  Medication Sig  . amLODipine (NORVASC) 5 MG tablet Take 0.5 tablets (2.5 mg total) by mouth at bedtime.  Marland Kitchen atorvastatin (LIPITOR) 20 MG tablet Take 1 tablet (20 mg total) by mouth every other day.  . brinzolamide (AZOPT) 1 % ophthalmic suspension Place 1 drop into both eyes 2 (two) times daily.   . Calcium Carb-Cholecalciferol (CALCIUM 600/VITAMIN D3 PO) Take 1 tablet by mouth every other day.  Marland Kitchen HYDROcodone-acetaminophen (NORCO/VICODIN) 5-325 MG tablet Take 1 tablet by mouth every 6 (six) hours as needed for moderate pain.  . metoprolol succinate (TOPROL-XL) 25 MG 24 hr tablet TAKE ONE-HALF TABLET BY  MOUTH DAILY  . nitroGLYCERIN (NITROSTAT) 0.4 MG SL tablet Place 1 tablet (0.4 mg total) under the tongue every 5 (five) minutes x 3 doses as needed for chest pain.  Marland Kitchen triamcinolone (KENALOG) 0.025 % ointment Apply 1 application topically every other day.   . TURMERIC PO Take by mouth.  . [DISCONTINUED] calcium-vitamin D (OSCAL WITH D) 500-200 MG-UNIT tablet Take 1 tablet by mouth.   No facility-administered encounter medications on file as of 02/03/2020.     Review of Systems  Review of Systems  No chest pain with exertion.  No orthopnea or PND.  No lower extremity edema.  Comprehensive review of systems otherwise negative. Physical Exam  BP 132/78 (BP Location: Left Arm, Cuff Size: Normal)   Pulse 62   Temp 97.7 F (36.5 C) (Temporal)   Ht 5\' 1"  (1.549 m)   Wt 137 lb (62.1 kg)   SpO2 98%   BMI 25.89 kg/m   Wt Readings from Last 5 Encounters:  02/03/20 137 lb (62.1 kg)  12/21/19 136 lb (61.7 kg)  08/17/18 132 lb (59.9 kg)   08/13/18 136 lb 12.8 oz (62.1 kg)  08/11/18 133 lb 14.4 oz (60.7 kg)    BMI Readings from Last 5 Encounters:  02/03/20 25.89 kg/m  12/21/19 25.70 kg/m  08/17/18 24.14 kg/m  08/13/18 25.02 kg/m  08/11/18 24.49 kg/m     Physical Exam General: Well-appearing, no distress Eyes: EOMI, no icterus Respiratory: High-pitched for sounds bilaterally that resolves with breathing through nares and interpreted as upper airway sounds, no stridor, clear to auscultation bilaterally without wheeze Cardiovascular: Regular rate and rhythm, no murmurs Abdomen: Soft, bowel sounds present MSK: No synovitis, no joint effusions Neuro: Normal gait, no weakness Psych: Normal mood, full affect   Assessment & Plan:   Julie Petty is a  86 y.o. woman with possible history of hypertension who we are seen in consultation at the request of Mattie Marlin, [place] for evaluation of lung nodules.  Lung nodules appear stable upon personal review CT chest 06/2018 and description of CT chest 06/2019 at [place] (cannot review images).  Similar description of nodules and read as stable per radiology report 12/2019.  Will review most recent CT scan after obtaining from [place] but anticipate no further follow-up given multiple solid subcentimeter nodules that appear unchanged.  Will contact patient if additional surveillance is felt necessary.  She does have daily cough productive of scant mucus in the mornings.  This is not particular bothersome to her.  She has associated nasal congestion.  Suspect related to allergic symptoms.  Review of CT scan does reveal very mild bronchiectatic changes in the right middle lobe and lingula on the based on description of symptoms do not think a significant trigger to her cough at this time.  Interestingly, chest x-ray looks mildly hyperinflated on both PA and lateral.  Possible given her atopic symptoms she has very mild reactive airways disease that does not warrant treatment at this time.   Could consider in the future.  She is encouraged to call the office and schedule appointment if cough worsens or she develops other respiratory symptoms.   Return if symptoms worsen or fail to improve.   Lanier Clam, MD 02/03/2020

## 2020-03-15 ENCOUNTER — Other Ambulatory Visit: Payer: Self-pay | Admitting: Physician Assistant

## 2020-10-31 IMAGING — CR DG CHEST 2V
2 series · 2 of 2 positions shown · non-contrast
Comparison: 04/24/2018

CLINICAL DATA: Chest pain.

EXAM:
CHEST - 2 VIEW

[chest pa]
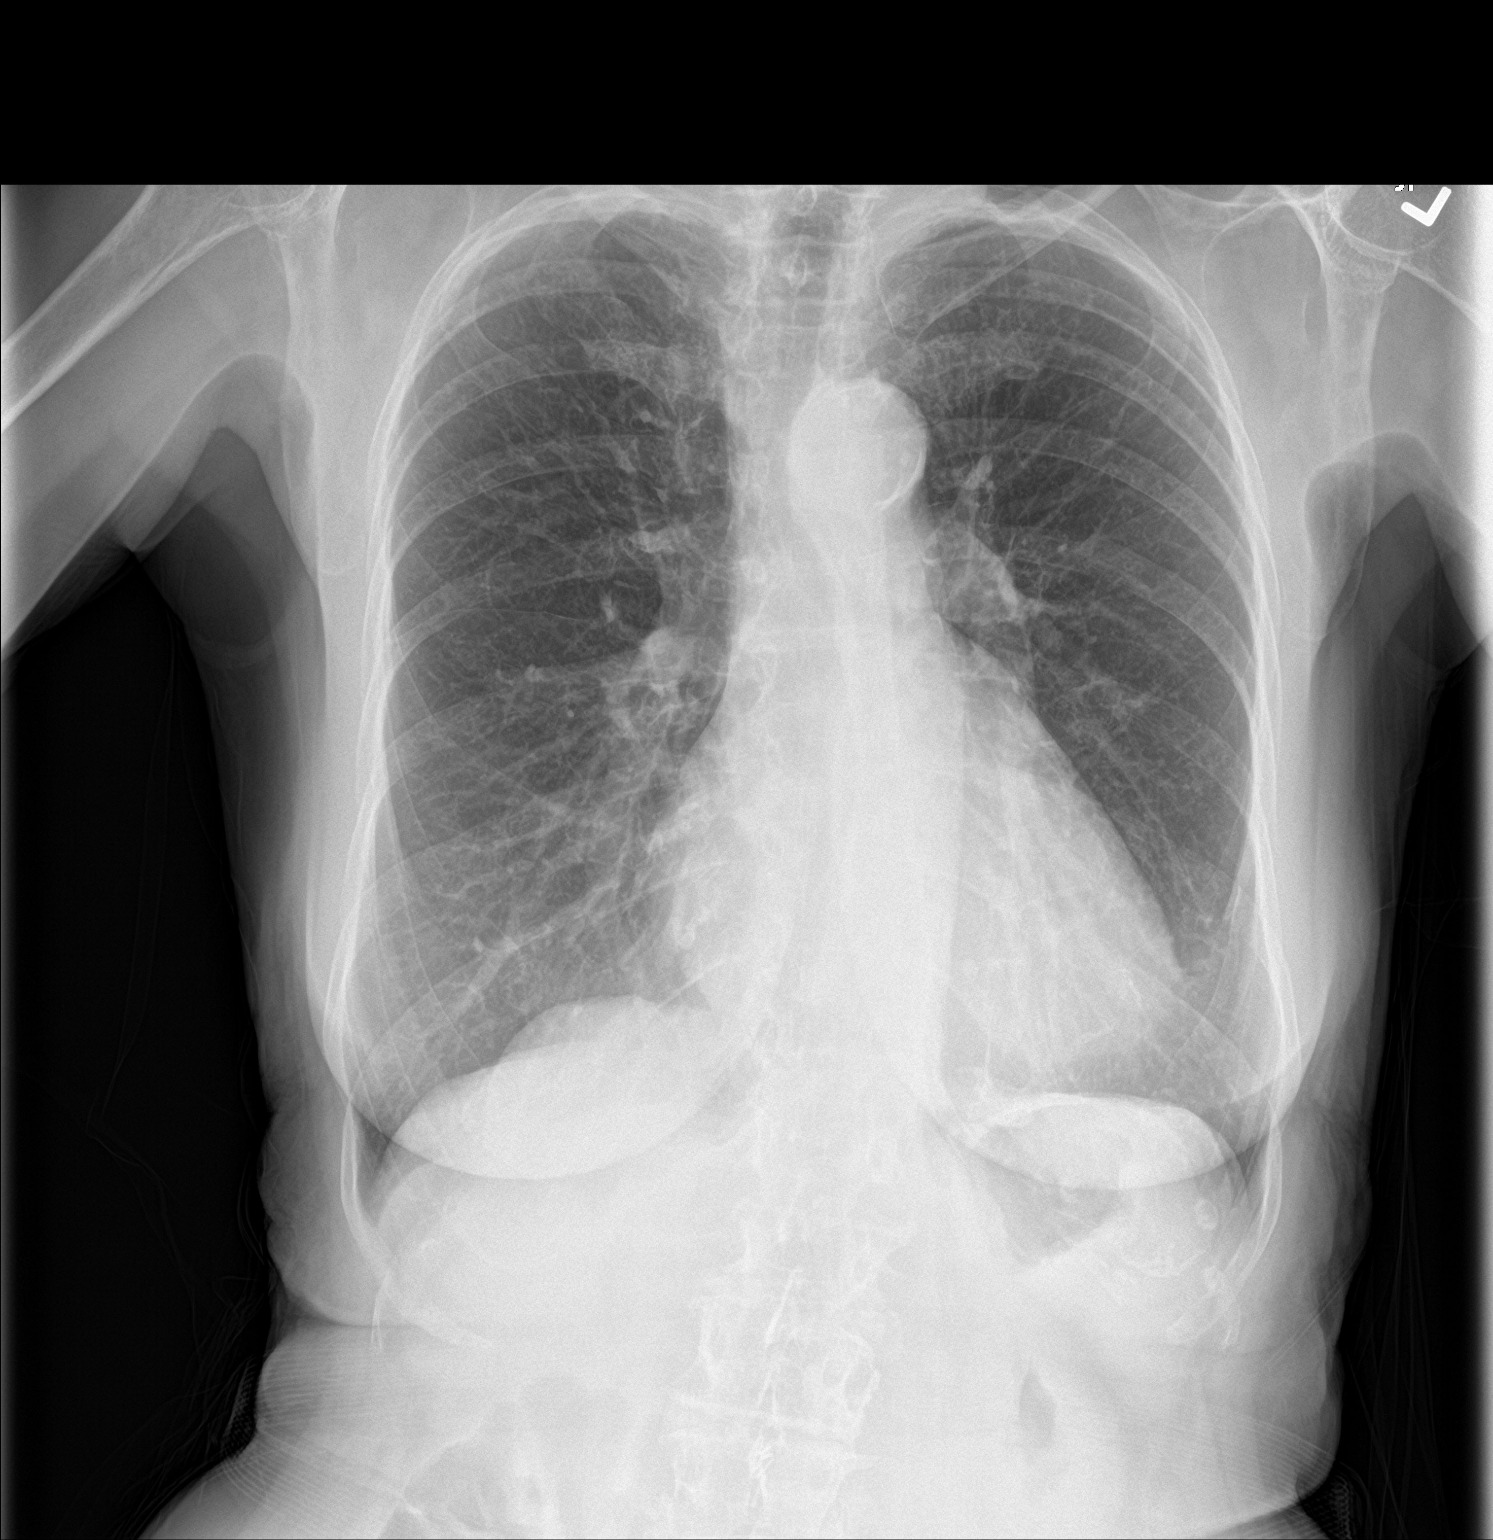

[chest lat]
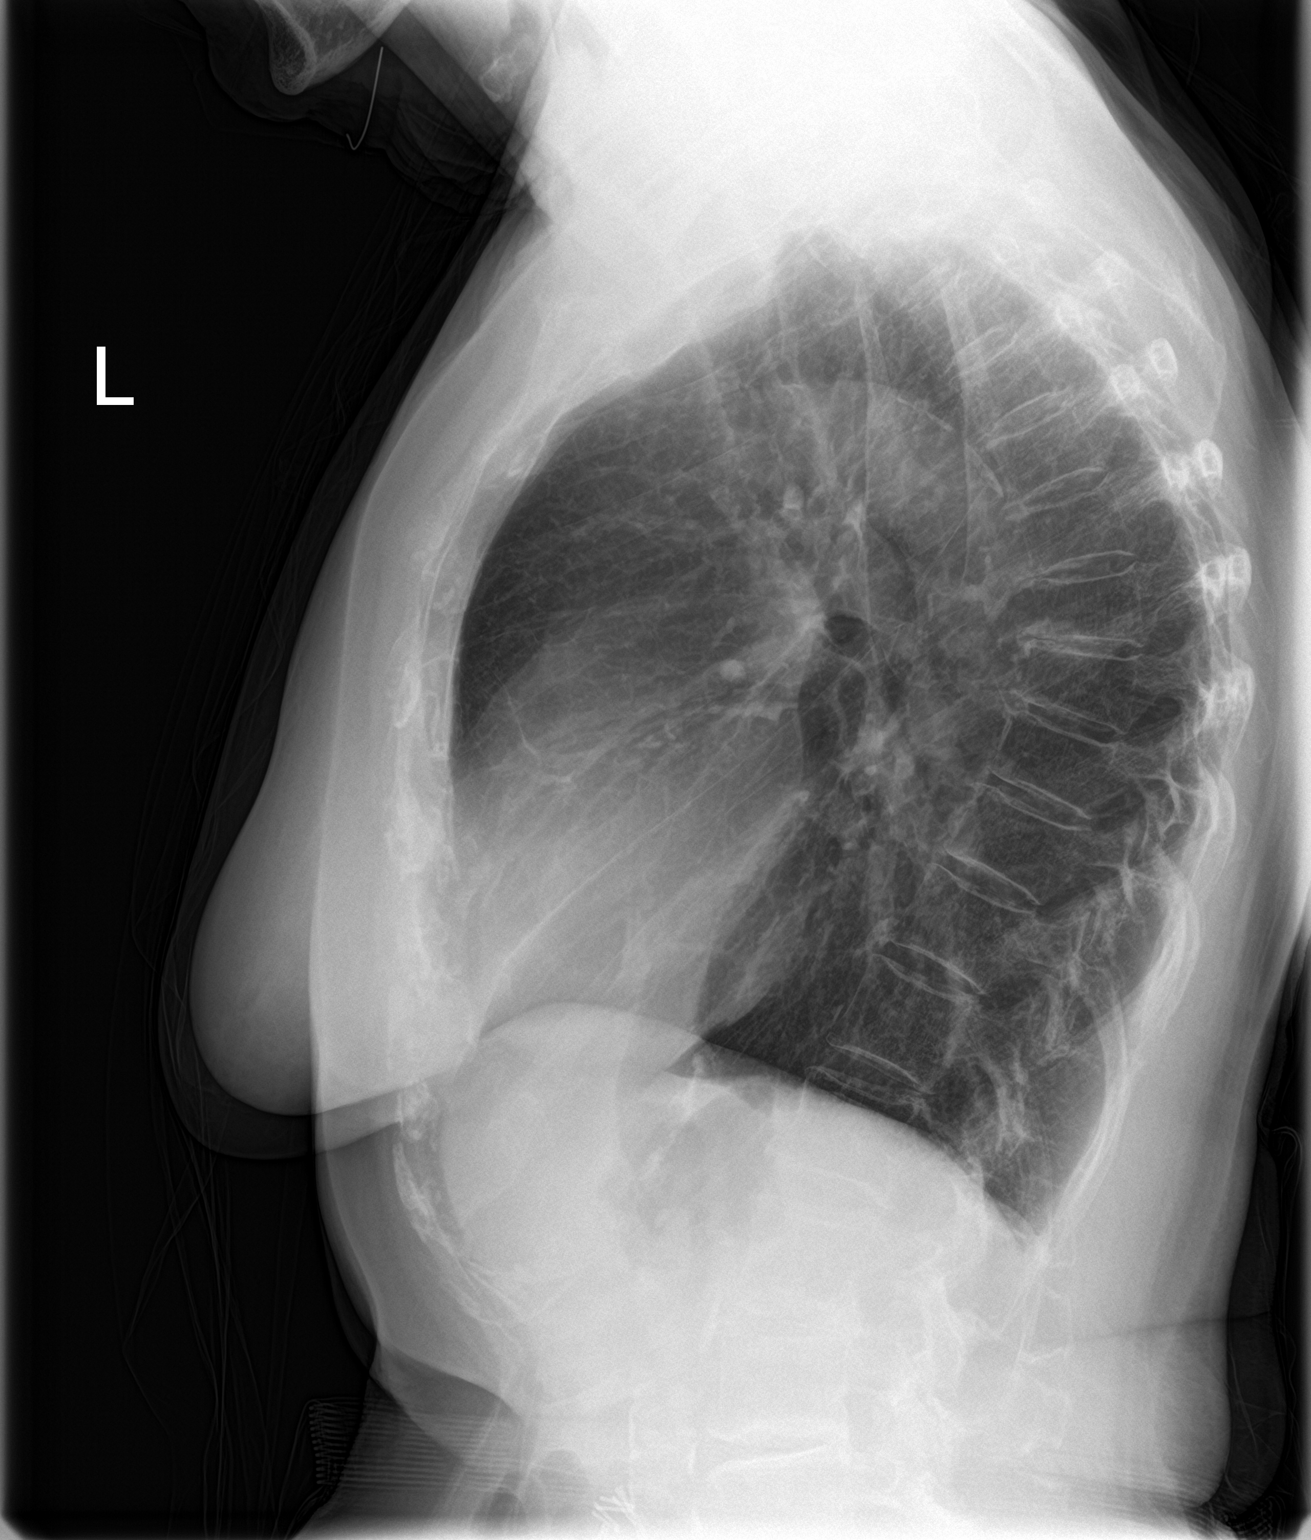

[2 of 2 positions shown; findings below may reference images not displayed]

FINDINGS: The cardiac silhouette, mediastinal and hilar contours are within
normal limits and stable. Mild tortuosity and calcification of the
thoracic aorta.

The lungs are clear. No infiltrates or effusions. No pulmonary
lesions.

The bony thorax is intact.
IMPRESSION: No acute cardiopulmonary findings.

## 2021-01-16 ENCOUNTER — Encounter (HOSPITAL_BASED_OUTPATIENT_CLINIC_OR_DEPARTMENT_OTHER): Payer: Self-pay | Admitting: *Deleted

## 2021-01-16 ENCOUNTER — Emergency Department (HOSPITAL_BASED_OUTPATIENT_CLINIC_OR_DEPARTMENT_OTHER)
Admission: EM | Admit: 2021-01-16 | Discharge: 2021-01-16 | Disposition: A | Discharge status: Home / Self Care | Payer: Medicare Other | Attending: Emergency Medicine | Admitting: Emergency Medicine

## 2021-01-16 ENCOUNTER — Other Ambulatory Visit: Payer: Self-pay

## 2021-01-16 DIAGNOSIS — X58XXXA Exposure to other specified factors, initial encounter: Secondary | ICD-10-CM | POA: Insufficient documentation

## 2021-01-16 DIAGNOSIS — T148XXA Other injury of unspecified body region, initial encounter: Secondary | ICD-10-CM

## 2021-01-16 DIAGNOSIS — S70312A Abrasion, left thigh, initial encounter: Secondary | ICD-10-CM | POA: Diagnosis not present

## 2021-01-16 DIAGNOSIS — S79922A Unspecified injury of left thigh, initial encounter: Secondary | ICD-10-CM | POA: Diagnosis present

## 2021-01-16 DIAGNOSIS — Z79899 Other long term (current) drug therapy: Secondary | ICD-10-CM | POA: Insufficient documentation

## 2021-01-16 DIAGNOSIS — I10 Essential (primary) hypertension: Secondary | ICD-10-CM | POA: Insufficient documentation

## 2021-01-16 NOTE — ED Triage Notes (Signed)
Patient was blueberry picking a week ago and she think she had a tick bite to her left inner thigh.

## 2021-01-16 NOTE — ED Provider Notes (Signed)
Julie Petty Provider Note  CSN: [id number] Arrival date & time: 01/16/21 [id number]    History Chief Complaint  Patient presents with   Tick Removal    Julie Petty is a 87 y.o. female presents for evaluation of a small wound on her L upper leg that has been there about a week since she was picking blueberrys. She is afraid a tick may have bitten her. She did not see a tick or pull one off but she had a tick bite last year that was slow to heal and she is worried about it. She has not had a fever, headache or rash.    Past Medical History:  Diagnosis Date   Complication of anesthesia    Essential hypertension    GERD (gastroesophageal reflux disease)    Glaucoma    Hernia    Hyperlipemia    Myocardial infarction ([place])    NSTEMI 04/24/18 (40% D2 by LHC; Dx: Takotsubo CM vs coronary spasm vs myocarditis)   PONV (postoperative nausea and vomiting)     Past Surgical History:  Procedure Laterality Date   CARDIAC CATHETERIZATION  04/05/03   CHOLECYSTECTOMY N/A 08/17/2018   Procedure: Laparoscopic Cholecystectomy;  Surgeon: Erroll Luna, MD;  Location: [facility];  Service: General;  Laterality: N/A;   KNEE ARTHROSCOPY  09/17/05   left   LEFT HEART CATH AND CORONARY ANGIOGRAPHY N/A 04/26/2018   Procedure: LEFT HEART CATH AND CORONARY ANGIOGRAPHY;  Surgeon: Leonie Man, MD;  Location: [place] [facility];  Service: Cardiovascular;  Laterality: N/A;   VAGINAL HYSTERECTOMY  1980's    Family History  Problem Relation Age of Onset   Heart attack Brother     Social History   Tobacco Use   Smoking status: Never   Smokeless tobacco: Never  Vaping Use   Vaping Use: Never used  Substance Use Topics   Alcohol use: No   Drug use: Never     Home Medications Prior to Admission medications   Medication Sig Start Date End Date Taking? Authorizing Provider  amLODipine (NORVASC) 5 MG tablet Take 0.5 tablets (2.5 mg total) by mouth at bedtime.  05/07/18  Yes Almyra Deforest, PA  brinzolamide (AZOPT) 1 % ophthalmic suspension Place 1 drop into both eyes 2 (two) times daily.    Yes [provider]  gabapentin (NEURONTIN) 100 MG capsule Take 100 mg by mouth daily.   Yes [provider]  metoprolol succinate (TOPROL-XL) 25 MG 24 hr tablet TAKE ONE-HALF TABLET BY  MOUTH DAILY 03/15/20  Yes Almyra Deforest, PA  omeprazole (PRILOSEC) 40 MG capsule Take 40 mg by mouth daily.   Yes [provider]  rosuvastatin (CRESTOR) 5 MG tablet Take 5 mg by mouth daily.   Yes [provider]  Calcium Carb-Cholecalciferol (CALCIUM 600/VITAMIN D3 PO) Take 1 tablet by mouth every other day.    [provider]  nitroGLYCERIN (NITROSTAT) 0.4 MG SL tablet Place 1 tablet (0.4 mg total) under the tongue every 5 (five) minutes x 3 doses as needed for chest pain. 04/28/18   Cheryln Manly, NP  triamcinolone (KENALOG) 0.025 % ointment Apply 1 application topically every other day.  03/22/18   [provider]  TURMERIC PO Take by mouth.    [provider]     Allergies    Morphine and related, Codeine, and Elemental sulfur   Review of Systems   Review of Systems A comprehensive review of systems was completed and negative except  as noted in HPI.    Physical Exam BP 136/83 (BP Location: Right Arm)   Pulse (!) 56   Temp 98.7 F (37.1 C) (Oral)   Resp 16   Ht '5\' 1"'$  (1.549 m)   Wt 62.1 kg   SpO2 99%   BMI 25.87 kg/m   Physical Exam Vitals and nursing note reviewed.  HENT:     Head: Normocephalic.     Nose: Nose normal.  Eyes:     Extraocular Movements: Extraocular movements intact.  Pulmonary:     Effort: Pulmonary effort is normal.  Musculoskeletal:        General: Normal range of motion.     Cervical back: Neck supple.  Skin:    Findings: No rash (on exposed skin).     Comments: There is a small 2-85m red mark on her L upper leg without signs of surrounding infection or retained FB/tick head.    Neurological:     Mental Status: She is alert and oriented to person, place, and time.  Psychiatric:        Mood and Affect: Mood normal.     ED Results / Procedures / Treatments   Labs (all labs ordered are listed, but only abnormal results are displayed) Labs Reviewed - No data to display  EKG None   Radiology No results found.  Procedures Procedures  Medications Ordered in the ED Medications - No data to display   MDM Rules/Calculators/A&P MDM Patient with small wound on leg without signs of infection. Doubt this was a tick bite, recommend topical antibiotic ointment and routine wound care. RTED for any signs of infection.   ED Course  I have reviewed the triage vital signs and the nursing notes.  Pertinent labs & imaging results that were available during my care of the patient were reviewed by me and considered in my medical decision making (see chart for details).     Final Clinical Impression(s) / ED Diagnoses Final diagnoses:  Abrasion    Rx / DC Orders ED Discharge Orders     None        STruddie Hidden MD 01/16/21 [phone]

## 2021-07-07 ENCOUNTER — Other Ambulatory Visit: Payer: Self-pay | Admitting: Physician Assistant

## 2021-07-28 ENCOUNTER — Other Ambulatory Visit: Payer: Self-pay | Admitting: Physician Assistant

## 2021-09-18 ENCOUNTER — Other Ambulatory Visit: Payer: Self-pay

## 2021-10-08 ENCOUNTER — Other Ambulatory Visit: Payer: Self-pay

## 2021-10-21 ENCOUNTER — Other Ambulatory Visit: Payer: Self-pay | Admitting: Physician Assistant

## 2021-10-21 NOTE — Telephone Encounter (Signed)
Patient has not been seen by one of our providers since 2020. ?

## 2021-12-17 ENCOUNTER — Ambulatory Visit: Payer: Medicare Other | Attending: Nurse Practitioner

## 2021-12-17 DIAGNOSIS — R262 Difficulty in walking, not elsewhere classified: Secondary | ICD-10-CM | POA: Diagnosis present

## 2021-12-17 DIAGNOSIS — M6281 Muscle weakness (generalized): Secondary | ICD-10-CM

## 2021-12-17 DIAGNOSIS — R252 Cramp and spasm: Secondary | ICD-10-CM | POA: Diagnosis present

## 2021-12-17 DIAGNOSIS — M5416 Radiculopathy, lumbar region: Secondary | ICD-10-CM | POA: Diagnosis present

## 2021-12-17 NOTE — Therapy (Signed)
OUTPATIENT PHYSICAL THERAPY THORACOLUMBAR EVALUATION   Patient Name: RUQAIYAH LABA MRN: [id number] DOB:04/04/1932, 88 y.o., female Today's Date: 12/17/2021   PT End of Session - 12/17/21 1032     Visit Number 1    Authorization Type [employer] MEDICARE    PT Start Time 1015    PT Stop Time 1100    PT Time Calculation (min) 45 min    Activity Tolerance Patient tolerated treatment well    Behavior During Therapy WFL for tasks assessed/performed             Past Medical History:  Diagnosis Date   Complication of anesthesia    Essential hypertension    GERD (gastroesophageal reflux disease)    Glaucoma    Hernia    Hyperlipemia    Myocardial infarction (HCC)    NSTEMI 04/24/18 (40% D2 by LHC; Dx: Takotsubo CM vs coronary spasm vs myocarditis)   PONV (postoperative nausea and vomiting)    Past Surgical History:  Procedure Laterality Date   CARDIAC CATHETERIZATION  04/05/03   CHOLECYSTECTOMY N/A 08/17/2018   Procedure: Laparoscopic Cholecystectomy;  Surgeon: Harriette Bouillon, MD;  Location: [facility];  Service: General;  Laterality: N/A;   KNEE ARTHROSCOPY  09/17/05   left   LEFT HEART CATH AND CORONARY ANGIOGRAPHY N/A 04/26/2018   Procedure: LEFT HEART CATH AND CORONARY ANGIOGRAPHY;  Surgeon: Marykay Lex, MD;  [facility];  Service: Cardiovascular;  Laterality: N/A;   VAGINAL HYSTERECTOMY  1980's   Patient Active Problem List   Diagnosis Date Noted   Radiculopathy, lumbar region 12/17/2021   Lung nodule 02/03/2020   Coronary artery disease involving native coronary artery of native heart without angina pectoris 08/14/2018   Cardiomyopathy (HCC)    NSTEMI (non-ST elevated myocardial infarction) (HCC) 04/24/2018   Essential hypertension 04/24/2018   Hyperlipidemia with target LDL less than 70 04/24/2018   Inguinal hernia 01/14/2011    PCP: Teena Irani, PA-C  REFERRING PROVIDER: Lavinia Sharps, NP   REFERRING DIAG: M54.16  (ICD-10-CM) - Radiculopathy, lumbar region M48.061 (ICD-10-CM) - Spinal stenosis, lumbar region without neurogenic claudication M47.816 (ICD-10-CM) - Spondylosis without myelopathy or radiculopathy, lumbar   Rationale for Evaluation and Treatment Rehabilitation  THERAPY DIAG:  Radiculopathy, lumbar region - Plan: PT plan of care cert/re-cert  Muscle weakness (generalized) - Plan: PT plan of care cert/re-cert  Cramp and spasm - Plan: PT plan of care cert/re-cert  Difficulty in walking, not elsewhere classified - Plan: PT plan of care cert/re-cert  ONSET DATE: 11/29/21  SUBJECTIVE:  SUBJECTIVE STATEMENT: Patient reports  PERTINENT HISTORY:  See above  PAIN:  Are you having pain? Yes: NPRS scale: 0/10 Pain location: currently none sitting still,  Pain description: na Aggravating factors: Prolonged standing, cleaning Relieving factors: heat   PRECAUTIONS: Fall  WEIGHT BEARING RESTRICTIONS No  FALLS:  Has patient fallen in last 6 months? Yes. Number of falls 1  LIVING ENVIRONMENT: Lives with: lives with their spouse Lives in: House/apartment Stairs: Yes: Internal: 12 steps; can reach both and External: 4 steps; on right going up Has following equipment at home: None  OCCUPATION: retired  PLOF: Independent  PATIENT GOALS to eliminate or manage my back pain and be able to do routine daily tasks without increased pain.   OBJECTIVE:   DIAGNOSTIC FINDINGS:  None found but scheduled for MRI  PATIENT SURVEYS:  FOTO do next visit  SCREENING FOR RED FLAGS: Bowel or bladder incontinence: No Spinal tumors: No Cauda equina syndrome: No Compression fracture: No Abdominal aneurysm: No  COGNITION:  Overall cognitive status: Within functional limits for tasks  assessed     SENSATION: WFL  MUSCLE LENGTH: Hamstrings: Right 60 deg; Left 60 deg Thomas test: Right pos ; Left pos   POSTURE: rounded shoulders, forward head, and increased thoracic kyphosis   LUMBAR ROM:   Active  A/PROM  eval  Flexion WNL  Extension WNL  Right lateral flexion   Left lateral flexion   Right rotation   Left rotation    (Blank rows = not tested)  LOWER EXTREMITY ROM:     WFL  LOWER EXTREMITY MMT:    Generally 4 to 4+/5  LUMBAR SPECIAL TESTS:  Straight leg raise test: Negative  FUNCTIONAL TESTS:  5 times sit to stand: do next visit Timed up and go (TUG): do next visit  GAIT: Distance walked: 50 Assistive device utilized: None Level of assistance: Complete Independence Comments:  short step length    TODAY'S TREATMENT  Initial eval completed and intiated HEP   PATIENT EDUCATION:  Education details: Initiated HEP Person educated: Patient Education method: Programmer, multimedia, Facilities manager, Verbal cues, and Handouts Education comprehension: verbalized understanding, returned demonstration, and verbal cues required   HOME EXERCISE PROGRAM: Access Code: [email or web address] URL: [email or web address] Date: 12/17/2021 Prepared by: Mikey Kirschner  Exercises - Supine Hamstring Stretch with Strap  - 1 x daily - 7 x weekly - 1 sets - 3 reps - 30 sec hold - Quadricep Stretch with Chair and Counter Support  - 1 x daily - 7 x weekly - 1 sets - 3 reps - 30 sec hold  ASSESSMENT:  CLINICAL IMPRESSION: Patient is a 88 y.o. female who was seen today for physical therapy evaluation and treatment for Lumbar stenosis with radiculopathy.  She presents with poor posture: kyphotic with decreased lumbar lordosis,  decreased flexibility,  weakness bilateral LE's and elevated pain with activity.  She would respond well to LE flexibility exercises and core stabilization to reduce pain and allow patient to resume prior level of function.    OBJECTIVE  IMPAIRMENTS decreased activity tolerance, decreased knowledge of condition, decreased mobility, difficulty walking, decreased ROM, decreased strength, hypomobility, increased fascial restrictions, increased muscle spasms, impaired flexibility, impaired sensation, improper body mechanics, postural dysfunction, and pain.   ACTIVITY LIMITATIONS carrying, lifting, bending, sitting, standing, squatting, sleeping, stairs, transfers, bed mobility, continence, bathing, toileting, dressing, and caring for others  PARTICIPATION LIMITATIONS: meal prep, cleaning, laundry, driving, shopping, community activity, yard work, and church  PERSONAL FACTORS Age, Fitness, and 1-2 comorbidities: CAD, HTN  are also affecting patient's functional outcome.   REHAB POTENTIAL: Good  CLINICAL DECISION MAKING: Evolving/moderate complexity  EVALUATION COMPLEXITY: Moderate   GOALS: Goals reviewed with patient? Yes  SHORT TERM GOALS: Target date: 01/14/2022  Patient will be independent with initial HEP  Baseline: Goal status: INITIAL  2.  Pain report to be no greater than 4/10  Baseline:  Goal status: INITIAL  3.  Radicular symptoms to centralize  Baseline:  Goal status: INITIAL   LONG TERM GOALS: Target date: 02/11/2022  Patient to be independent with advanced HEP  Baseline:  Goal status: INITIAL  2.  Patient to report pain no greater than 2/10  Baseline:  Goal status: INITIAL  3.  Eliminate radicular symptoms Baseline:  Goal status: INITIAL  4.  Patient to report 80% improvement in overall symptoms Baseline:  Goal status: INITIAL  5.  Patient to be able to walk 1 mile without increased pain. Baseline:  Goal status: INITIAL  6.  Patient to be able to perform all household cleaning tasks and laundry without increased pain.  Baseline:  Goal status: INITIAL   PLAN: PT FREQUENCY: 2x/week  PT DURATION: 8 weeks  PLANNED INTERVENTIONS: Therapeutic exercises, Therapeutic activity,  Neuromuscular re-education, Balance training, Gait training, Patient/Family education, Joint mobilization, Stair training, Aquatic Therapy, Dry Needling, Electrical stimulation, Spinal mobilization, Cryotherapy, Moist heat, Taping, Traction, Ultrasound, Ionotophoresis 4mg /ml Dexamethasone, Manual therapy, and Re-evaluation.  PLAN FOR NEXT SESSION: Review HEP, NuStep, add core strengthening   Kyl Givler B. Celes Dedic, PT 12/17/21 1:30 PM  [facility] [address] [place], [place] [address] Phone # [phone] Fax [phone]

## 2021-12-19 ENCOUNTER — Ambulatory Visit: Payer: Medicare Other

## 2021-12-19 DIAGNOSIS — R252 Cramp and spasm: Secondary | ICD-10-CM

## 2021-12-19 DIAGNOSIS — M5416 Radiculopathy, lumbar region: Secondary | ICD-10-CM | POA: Diagnosis not present

## 2021-12-19 DIAGNOSIS — M6281 Muscle weakness (generalized): Secondary | ICD-10-CM

## 2021-12-19 DIAGNOSIS — R262 Difficulty in walking, not elsewhere classified: Secondary | ICD-10-CM

## 2021-12-19 NOTE — Therapy (Signed)
OUTPATIENT PHYSICAL THERAPY TREATMENT NOTE   Patient Name: Julie Petty MRN: [id number] DOB:1931-10-04, 88 y.o., female Today's Date: 12/19/2021  PCP: Julie Dials, PA-C REFERRING PROVIDER: Burnard Hawthorne, NP  END OF SESSION:   PT End of Session - 12/19/21 1024     Visit Number 2    Date for PT Re-Evaluation 02/11/22    Authorization Type [employer]    PT Start Time 1020    PT Stop Time 1100    PT Time Calculation (min) 40 min    Activity Tolerance Patient tolerated treatment well    Behavior During Therapy WFL for tasks assessed/performed             Past Medical History:  Diagnosis Date   Complication of anesthesia    Essential hypertension    GERD (gastroesophageal reflux disease)    Glaucoma    Hernia    Hyperlipemia    Myocardial infarction ([place])    NSTEMI 04/24/18 (40% D2 by LHC; Dx: Takotsubo CM vs coronary spasm vs myocarditis)   PONV (postoperative nausea and vomiting)    Past Surgical History:  Procedure Laterality Date   CARDIAC CATHETERIZATION  04/05/03   CHOLECYSTECTOMY N/A 08/17/2018   Procedure: Laparoscopic Cholecystectomy;  Surgeon: Julie Luna, MD;  Location: [facility];  Service: General;  Laterality: N/A;   KNEE ARTHROSCOPY  09/17/05   left   LEFT HEART CATH AND CORONARY ANGIOGRAPHY N/A 04/26/2018   Procedure: LEFT HEART CATH AND CORONARY ANGIOGRAPHY;  Surgeon: Leonie Man, MD;  Location: [facility];  Service: Cardiovascular;  Laterality: N/A;   VAGINAL HYSTERECTOMY  1980's   Patient Active Problem List   Diagnosis Date Noted   Radiculopathy, lumbar region 12/17/2021   Lung nodule 02/03/2020   Coronary artery disease involving native coronary artery of native heart without angina pectoris 08/14/2018   Cardiomyopathy ([place])    NSTEMI (non-ST elevated myocardial infarction) ([place]) 04/24/2018   Essential hypertension 04/24/2018   Hyperlipidemia with target LDL less than 70 04/24/2018   Inguinal hernia  01/14/2011    REFERRING DIAG: M54.16 (ICD-10-CM) - Radiculopathy, lumbar region M48.061 (ICD-10-CM) - Spinal stenosis, lumbar region without neurogenic claudication M47.816 (ICD-10-CM) - Spondylosis without myelopathy or radiculopathy, lumbar   THERAPY DIAG:  Radiculopathy, lumbar region  Muscle weakness (generalized)  Cramp and spasm  Difficulty in walking, not elsewhere classified  Rationale for Evaluation and Treatment Rehabilitation  PERTINENT HISTORY: see above  PRECAUTIONS: fall  SUBJECTIVE: Patient states she had no adverse effects from new stretches.  She states she has no pain at the moment.   PAIN:  Are you having pain? No   OBJECTIVE: (objective measures completed at initial evaluation unless otherwise dated)  OBJECTIVE:    DIAGNOSTIC FINDINGS:  None found but scheduled for MRI   PATIENT SURVEYS:  FOTO do next visit   SCREENING FOR RED FLAGS: Bowel or bladder incontinence: No Spinal tumors: No Cauda equina syndrome: No Compression fracture: No Abdominal aneurysm: No   COGNITION:           Overall cognitive status: Within functional limits for tasks assessed                          SENSATION: WFL   MUSCLE LENGTH: Hamstrings: Right 60 deg; Left 60 deg Thomas test: Right pos ; Left pos    POSTURE: rounded shoulders, forward head, and increased thoracic kyphosis     LUMBAR ROM:  Active  A/PROM  eval  Flexion WNL  Extension WNL  Right lateral flexion    Left lateral flexion    Right rotation    Left rotation     (Blank rows = not tested)   LOWER EXTREMITY ROM:      WFL   LOWER EXTREMITY MMT:     Generally 4 to 4+/5   LUMBAR SPECIAL TESTS:  Straight leg raise test: Negative   FUNCTIONAL TESTS:  5 times sit to stand: do next visit Timed up and go (TUG): do next visit   GAIT: Distance walked: 50 Assistive device utilized: None Level of assistance: Complete Independence Comments:  short step length       TODAY'S TREATMENT  :  12/19/21 NuStep x 5 min level 5 (PT present to discuss progress) Reviewed HEP Added PPT x 20 PPT with march x 20 PPT with alternating arms and legs x 20 PPT with SLR x 20  TODAY'S TREATMENT  Initial eval completed and intiated HEP     PATIENT EDUCATION:  Education details: Added new core exercises to HEP Person educated: Patient Education method: Explanation, Demonstration, Verbal cues, and Handouts Education comprehension: verbalized understanding, returned demonstration, and verbal cues required     HOME EXERCISE PROGRAM: Access Code: [email or web address] URL: [email or web address] Date: 12/19/2021 Prepared by: Julie Petty  Exercises - Supine Hamstring Stretch with Strap  - 1 x daily - 7 x weekly - 1 sets - 3 reps - 30 sec hold - Quadricep Stretch with Chair and Counter Support  - 1 x daily - 7 x weekly - 1 sets - 3 reps - 30 sec hold - Supine Posterior Pelvic Tilt  - 1 x daily - 7 x weekly - 3 sets - 10 reps - Supine 90/90 Alternating Heel Touches with Posterior Pelvic Tilt  - 1 x daily - 7 x weekly - 3 sets - 10 reps - Dead Bug  - 1 x daily - 7 x weekly - 3 sets - 10 repsAccess Code: [email or web address] URL: [email or web address] Date: 12/17/2021 Prepared by: Julie Petty   Exercises - Supine Hamstring Stretch with Strap  - 1 x daily - 7 x weekly - 1 sets - 3 reps - 30 sec hold - Quadricep Stretch with Chair and Counter Support  - 1 x daily - 7 x weekly - 1 sets - 3 reps - 30 sec hold   ASSESSMENT:   CLINICAL IMPRESSION: Julie Petty responded well to initial stretching.  She was able to tolerate addition of core exercises without any increase in pain.  She needed heavy verbaal cues for correct technique.   She would benefit from continued LE flexibility exercises and core stabilization to reduce pain and allow patient to resume prior level of function.      OBJECTIVE IMPAIRMENTS decreased activity tolerance, decreased knowledge of condition, decreased  mobility, difficulty walking, decreased ROM, decreased strength, hypomobility, increased fascial restrictions, increased muscle spasms, impaired flexibility, impaired sensation, improper body mechanics, postural dysfunction, and pain.    ACTIVITY LIMITATIONS carrying, lifting, bending, sitting, standing, squatting, sleeping, stairs, transfers, bed mobility, continence, bathing, toileting, dressing, and caring for others   PARTICIPATION LIMITATIONS: meal prep, cleaning, laundry, driving, shopping, community activity, yard work, and church   PERSONAL FACTORS Age, Fitness, and 1-2 comorbidities: CAD, HTN  are also affecting patient's functional outcome.    REHAB POTENTIAL: Good   CLINICAL DECISION MAKING: Evolving/moderate complexity   EVALUATION COMPLEXITY: Moderate     GOALS: Goals reviewed with patient? Yes  SHORT TERM GOALS: Target date: 01/14/2022   Patient will be independent with initial HEP  Baseline: Goal status: INITIAL   2.  Pain report to be no greater than 4/10  Baseline:  Goal status: INITIAL   3.  Radicular symptoms to centralize  Baseline:  Goal status: INITIAL     LONG TERM GOALS: Target date: 02/11/2022   Patient to be independent with advanced HEP  Baseline:  Goal status: INITIAL   2.  Patient to report pain no greater than 2/10  Baseline:  Goal status: INITIAL   3.  Eliminate radicular symptoms Baseline:  Goal status: INITIAL   4.  Patient to report 80% improvement in overall symptoms Baseline:  Goal status: INITIAL   5.  Patient to be able to walk 1 mile without increased pain. Baseline:  Goal status: INITIAL   6.  Patient to be able to perform all household cleaning tasks and laundry without increased pain.  Baseline:  Goal status: INITIAL     PLAN: PT FREQUENCY: 2x/week   PT DURATION: 8 weeks   PLANNED INTERVENTIONS: Therapeutic exercises, Therapeutic activity, Neuromuscular re-education, Balance training, Gait training,  Patient/Family education, Joint mobilization, Stair training, Aquatic Therapy, Dry Needling, Electrical stimulation, Spinal mobilization, Cryotherapy, Moist heat, Taping, Traction, Ultrasound, Ionotophoresis '4mg'$ /ml Dexamethasone, Manual therapy, and Re-evaluation.   PLAN FOR NEXT SESSION: NuStep, progress core strengthening and add in LE strengthening.       Anderson Malta B. Tai Skelly, PT 12/19/21 2:29 PM  [facility] [address], [place], [place] [address] Phone # [phone] Fax [phone]

## 2021-12-23 ENCOUNTER — Ambulatory Visit: Payer: Medicare Other | Attending: Nurse Practitioner | Admitting: Physical Therapy

## 2021-12-23 DIAGNOSIS — M6281 Muscle weakness (generalized): Secondary | ICD-10-CM | POA: Diagnosis present

## 2021-12-23 DIAGNOSIS — M5416 Radiculopathy, lumbar region: Secondary | ICD-10-CM | POA: Insufficient documentation

## 2021-12-23 DIAGNOSIS — R252 Cramp and spasm: Secondary | ICD-10-CM | POA: Insufficient documentation

## 2021-12-23 DIAGNOSIS — R262 Difficulty in walking, not elsewhere classified: Secondary | ICD-10-CM | POA: Diagnosis present

## 2021-12-23 NOTE — Therapy (Signed)
OUTPATIENT PHYSICAL THERAPY TREATMENT NOTE   Patient Name: Julie Petty MRN: [id number] DOB:1932-01-21, 89 y.o., female Today's Date: 12/23/2021  PCP: Aura Dials, PA-C REFERRING PROVIDER: Burnard Hawthorne, NP  END OF SESSION:   PT End of Session - 12/23/21 1439     Visit Number 3    Date for PT Re-Evaluation 02/11/22    Authorization Type [employer] MEDICARE    PT Start Time 1400    PT Stop Time 1439    PT Time Calculation (min) 39 min    Activity Tolerance Patient tolerated treatment well    Behavior During Therapy WFL for tasks assessed/performed              Past Medical History:  Diagnosis Date   Complication of anesthesia    Essential hypertension    GERD (gastroesophageal reflux disease)    Glaucoma    Hernia    Hyperlipemia    Myocardial infarction ([place])    NSTEMI 04/24/18 (40% D2 by LHC; Dx: Takotsubo CM vs coronary spasm vs myocarditis)   PONV (postoperative nausea and vomiting)    Past Surgical History:  Procedure Laterality Date   CARDIAC CATHETERIZATION  04/05/03   CHOLECYSTECTOMY N/A 08/17/2018   Procedure: Laparoscopic Cholecystectomy;  Surgeon: Erroll Luna, MD;  Location: [facility];  Service: General;  Laterality: N/A;   KNEE ARTHROSCOPY  09/17/05   left   LEFT HEART CATH AND CORONARY ANGIOGRAPHY N/A 04/26/2018   Procedure: LEFT HEART CATH AND CORONARY ANGIOGRAPHY;  Surgeon: Leonie Man, MD;  Location: [facility];  Service: Cardiovascular;  Laterality: N/A;   VAGINAL HYSTERECTOMY  1980's   Patient Active Problem List   Diagnosis Date Noted   Radiculopathy, lumbar region 12/17/2021   Lung nodule 02/03/2020   Coronary artery disease involving native coronary artery of native heart without angina pectoris 08/14/2018   Cardiomyopathy ([place])    NSTEMI (non-ST elevated myocardial infarction) ([place]) 04/24/2018   Essential hypertension 04/24/2018   Hyperlipidemia with target LDL less than 70 04/24/2018   Inguinal  hernia 01/14/2011    REFERRING DIAG: M54.16 (ICD-10-CM) - Radiculopathy, lumbar region M48.061 (ICD-10-CM) - Spinal stenosis, lumbar region without neurogenic claudication M47.816 (ICD-10-CM) - Spondylosis without myelopathy or radiculopathy, lumbar   THERAPY DIAG:  Muscle weakness (generalized)  Difficulty in walking, not elsewhere classified  Radiculopathy, lumbar region  Rationale for Evaluation and Treatment Rehabilitation  PERTINENT HISTORY: see above  PRECAUTIONS: fall  SUBJECTIVE: Patient states she had no adverse effects from new stretches.  She states she has no pain at the moment.   PAIN:  Are you having pain? No   OBJECTIVE: (objective measures completed at initial evaluation unless otherwise dated)  OBJECTIVE:    DIAGNOSTIC FINDINGS:  None found but scheduled for MRI   PATIENT SURVEYS:  FOTO do next visit   SCREENING FOR RED FLAGS: Bowel or bladder incontinence: No Spinal tumors: No Cauda equina syndrome: No Compression fracture: No Abdominal aneurysm: No   COGNITION:           Overall cognitive status: Within functional limits for tasks assessed                          SENSATION: WFL   MUSCLE LENGTH: Hamstrings: Right 60 deg; Left 60 deg Thomas test: Right pos ; Left pos    POSTURE: rounded shoulders, forward head, and increased thoracic kyphosis     LUMBAR ROM:    Active  A/PROM  eval  Flexion WNL  Extension WNL  Right lateral flexion    Left lateral flexion    Right rotation    Left rotation     (Blank rows = not tested)   LOWER EXTREMITY ROM:      WFL   LOWER EXTREMITY MMT:     Generally 4 to 4+/5   LUMBAR SPECIAL TESTS:  Straight leg raise test: Negative   FUNCTIONAL TESTS:  5 times sit to stand: 13.2s no UE support Timed up and go (TUG): 11.43s no AD   GAIT: Distance walked: 50 Assistive device utilized: None Level of assistance: Complete Independence Comments:  short step length   TODAY'S TREATMENT:12/23/21   FOTO completed: score 44 best 57 Nustep 5 mins L5 (PT present to discuss progress) 5xSTS: 13.2s no UE support TUG: 11.43s no AD Standing marching with one hand at bar x20 Standing marching x20 without UE support Supine alternating arms/legs (dead bugs) Supine SLR x20 each Hooklying hip abduction yellow band x20    TODAY'S TREATMENT :  12/19/21 NuStep x 5 min level 5 (PT present to discuss progress) Reviewed HEP Added PPT x 20 PPT with march x 20 PPT with alternating arms and legs x 20 PPT with SLR x 20  TODAY'S TREATMENT  Initial eval completed and intiated HEP   PATIENT EDUCATION:  Education details: Added new core exercises to HEP Person educated: Patient Education method: Explanation, Demonstration, Verbal cues, and Handouts Education comprehension: verbalized understanding, returned demonstration, and verbal cues required     HOME EXERCISE PROGRAM: Access Code: [email or web address] URL: [email or web address] Date: 12/19/2021 Prepared by: Candyce Churn  Exercises - Supine Hamstring Stretch with Strap  - 1 x daily - 7 x weekly - 1 sets - 3 reps - 30 sec hold - Quadricep Stretch with Chair and Counter Support  - 1 x daily - 7 x weekly - 1 sets - 3 reps - 30 sec hold - Supine Posterior Pelvic Tilt  - 1 x daily - 7 x weekly - 3 sets - 10 reps - Supine 90/90 Alternating Heel Touches with Posterior Pelvic Tilt  - 1 x daily - 7 x weekly - 3 sets - 10 reps - Dead Bug  - 1 x daily - 7 x weekly - 3 sets - 10 repsAccess Code: [email or web address] URL: [email or web address] Date: 12/17/2021 Prepared by: Candyce Churn   Exercises - Supine Hamstring Stretch with Strap  - 1 x daily - 7 x weekly - 1 sets - 3 reps - 30 sec hold - Quadricep Stretch with Chair and Counter Support  - 1 x daily - 7 x weekly - 1 sets - 3 reps - 30 sec hold   ASSESSMENT:   CLINICAL IMPRESSION: Julie Petty responded well to treatment, has been doing HEP at home consistently. She needed moderate  verbal cues for correct technique. She would benefit from continued LE flexibility exercises and core stabilization to reduce pain and allow patient to resume prior level of function.      OBJECTIVE IMPAIRMENTS decreased activity tolerance, decreased knowledge of condition, decreased mobility, difficulty walking, decreased ROM, decreased strength, hypomobility, increased fascial restrictions, increased muscle spasms, impaired flexibility, impaired sensation, improper body mechanics, postural dysfunction, and pain.    ACTIVITY LIMITATIONS carrying, lifting, bending, sitting, standing, squatting, sleeping, stairs, transfers, bed mobility, continence, bathing, toileting, dressing, and caring for others   PARTICIPATION LIMITATIONS: meal prep, cleaning, laundry, driving, shopping, community activity, yard work, and church   PERSONAL FACTORS Age, Fitness, and 1-2 comorbidities: CAD,  HTN  are also affecting patient's functional outcome.    REHAB POTENTIAL: Good   CLINICAL DECISION MAKING: Evolving/moderate complexity   EVALUATION COMPLEXITY: Moderate     GOALS: Goals reviewed with patient? Yes   SHORT TERM GOALS: Target date: 01/14/2022   Patient will be independent with initial HEP  Baseline: Goal status: INITIAL   2.  Pain report to be no greater than 4/10  Baseline:  Goal status: INITIAL   3.  Radicular symptoms to centralize  Baseline:  Goal status: INITIAL     LONG TERM GOALS: Target date: 02/11/2022   Patient to be independent with advanced HEP  Baseline:  Goal status: INITIAL   2.  Patient to report pain no greater than 2/10  Baseline:  Goal status: INITIAL   3.  Eliminate radicular symptoms Baseline:  Goal status: INITIAL   4.  Patient to report 80% improvement in overall symptoms Baseline:  Goal status: INITIAL   5.  Patient to be able to walk 1 mile without increased pain. Baseline:  Goal status: INITIAL   6.  Patient to be able to perform all household  cleaning tasks and laundry without increased pain.  Baseline:  Goal status: INITIAL     PLAN: PT FREQUENCY: 2x/week   PT DURATION: 8 weeks   PLANNED INTERVENTIONS: Therapeutic exercises, Therapeutic activity, Neuromuscular re-education, Balance training, Gait training, Patient/Family education, Joint mobilization, Stair training, Aquatic Therapy, Dry Needling, Electrical stimulation, Spinal mobilization, Cryotherapy, Moist heat, Taping, Traction, Ultrasound, Ionotophoresis '4mg'$ /ml Dexamethasone, Manual therapy, and Re-evaluation.   PLAN FOR NEXT SESSION: NuStep, progress core strengthening and add in LE strengthening.      Stacy Gardner, PT, DPT 07/03/232:40 PM

## 2021-12-26 ENCOUNTER — Ambulatory Visit: Payer: Medicare Other | Admitting: Physical Therapy

## 2021-12-26 DIAGNOSIS — R262 Difficulty in walking, not elsewhere classified: Secondary | ICD-10-CM

## 2021-12-26 DIAGNOSIS — M5416 Radiculopathy, lumbar region: Secondary | ICD-10-CM

## 2021-12-26 DIAGNOSIS — M6281 Muscle weakness (generalized): Secondary | ICD-10-CM | POA: Diagnosis not present

## 2021-12-26 NOTE — Therapy (Signed)
OUTPATIENT PHYSICAL THERAPY TREATMENT NOTE   Patient Name: Julie Petty MRN: [id number] DOB:[birth year], 88 y.o., female Today's Date: 12/26/2021  PCP: Aura Dials, PA-C REFERRING PROVIDER: Burnard Hawthorne, NP  END OF SESSION:   PT End of Session - 12/26/21 1535     Visit Number 4    Date for PT Re-Evaluation 02/11/22    Authorization Type [employer]    PT Start Time 1530    PT Stop Time 1610    PT Time Calculation (min) 40 min    Activity Tolerance Patient tolerated treatment well    Behavior During Therapy WFL for tasks assessed/performed              Past Medical History:  Diagnosis Date   Complication of anesthesia    Essential hypertension    GERD (gastroesophageal reflux disease)    Glaucoma    Hernia    Hyperlipemia    Myocardial infarction ([place])    NSTEMI 04/24/18 (40% D2 by LHC; Dx: Takotsubo CM vs coronary spasm vs myocarditis)   PONV (postoperative nausea and vomiting)    Past Surgical History:  Procedure Laterality Date   CARDIAC CATHETERIZATION  04/05/03   CHOLECYSTECTOMY N/A 08/17/2018   Procedure: Laparoscopic Cholecystectomy;  Surgeon: Erroll Luna, MD;  Location: [place] [facility];  Service: General;  Laterality: N/A;   KNEE ARTHROSCOPY  09/17/05   left   LEFT HEART CATH AND CORONARY ANGIOGRAPHY N/A 04/26/2018   Procedure: LEFT HEART CATH AND CORONARY ANGIOGRAPHY;  Surgeon: Leonie Man, MD;  Location: [facility];  Service: Cardiovascular;  Laterality: N/A;   VAGINAL HYSTERECTOMY  1980's   Patient Active Problem List   Diagnosis Date Noted   Radiculopathy, lumbar region 12/17/2021   Lung nodule 02/03/2020   Coronary artery disease involving native coronary artery of native heart without angina pectoris 08/14/2018   Cardiomyopathy ([place])    NSTEMI (non-ST elevated myocardial infarction) ([place]) 04/24/2018   Essential hypertension 04/24/2018   Hyperlipidemia with target LDL less than 70 04/24/2018   Inguinal  hernia 01/14/2011    REFERRING DIAG: M54.16 (ICD-10-CM) - Radiculopathy, lumbar region M48.061 (ICD-10-CM) - Spinal stenosis, lumbar region without neurogenic claudication M47.816 (ICD-10-CM) - Spondylosis without myelopathy or radiculopathy, lumbar   THERAPY DIAG:  Muscle weakness (generalized)  Difficulty in walking, not elsewhere classified  Radiculopathy, lumbar region  Rationale for Evaluation and Treatment Rehabilitation  PERTINENT HISTORY: see above  PRECAUTIONS: fall  SUBJECTIVE: pt reports "I think I'm getting better". Stating she hasn't been waking up at night due to back pain.   PAIN:  Are you having pain? No   OBJECTIVE: (objective measures completed at initial evaluation unless otherwise dated)  OBJECTIVE:    DIAGNOSTIC FINDINGS:  None found but scheduled for MRI   PATIENT SURVEYS:     SCREENING FOR RED FLAGS: Bowel or bladder incontinence: No Spinal tumors: No Cauda equina syndrome: No Compression fracture: No Abdominal aneurysm: No   COGNITION:           Overall cognitive status: Within functional limits for tasks assessed                          SENSATION: WFL   MUSCLE LENGTH: Hamstrings: Right 60 deg; Left 60 deg Thomas test: Right pos ; Left pos    POSTURE: rounded shoulders, forward head, and increased thoracic kyphosis     LUMBAR ROM:    Active  A/PROM  eval  Flexion  WNL  Extension WNL  Right lateral flexion    Left lateral flexion    Right rotation    Left rotation     (Blank rows = not tested)   LOWER EXTREMITY ROM:      WFL   LOWER EXTREMITY MMT:     Generally 4 to 4+/5   LUMBAR SPECIAL TESTS:  Straight leg raise test: Negative   FUNCTIONAL TESTS:  5 times sit to stand: 13.2s no UE support Timed up and go (TUG): 11.43s no AD   GAIT: Distance walked: 50 Assistive device utilized: None Level of assistance: Complete Independence Comments:  short step length   TODAY'S TREATMENT: 12/26/21  Nustep 6 mins L5 (PT  present to discuss progress) Supine SLR 2x10 each Supine alternating arms/legs (dead bugs) 2x10 Hooklying hip abduction green band x20  Same hand/knee ball press 2x10 Standing marching - one hand at bar 2x10 Standing opp arm/knee raises one hand at bar 2x10 Side stepping 30'x2 each leg Gait for increased bil step height and decreased forward head carriage 500' mod I with minimal cues, noted decreased lateral trunk sway with cues for increased step height.   TODAY'S TREATMENT:12/23/21  FOTO completed: score 44 best 57 Nustep 5 mins L5 (PT present to discuss progress) 5xSTS: 13.2s no UE support TUG: 11.43s no AD Standing marching with one hand at bar x20 Standing marching x20 without UE support Supine alternating arms/legs (dead bugs) 2x10 Supine SLR x20 each Hooklying hip abduction yellow band x20    TODAY'S TREATMENT :  12/19/21 NuStep x 5 min level 5 (PT present to discuss progress) Reviewed HEP Added PPT x 20 PPT with march x 20 PPT with alternating arms and legs x 20 PPT with SLR x 20  TODAY'S TREATMENT  Initial eval completed and intiated HEP   PATIENT EDUCATION:  Education details: Added new core exercises to HEP Person educated: Patient Education method: Explanation, Demonstration, Verbal cues, and Handouts Education comprehension: verbalized understanding, returned demonstration, and verbal cues required     HOME EXERCISE PROGRAM: Access Code: [email or web address] URL: [email or web address] Date: 12/19/2021 Prepared by: Candyce Churn  Exercises - Supine Hamstring Stretch with Strap  - 1 x daily - 7 x weekly - 1 sets - 3 reps - 30 sec hold - Quadricep Stretch with Chair and Counter Support  - 1 x daily - 7 x weekly - 1 sets - 3 reps - 30 sec hold - Supine Posterior Pelvic Tilt  - 1 x daily - 7 x weekly - 3 sets - 10 reps - Supine 90/90 Alternating Heel Touches with Posterior Pelvic Tilt  - 1 x daily - 7 x weekly - 3 sets - 10 reps - Dead Bug  - 1 x daily - 7  x weekly - 3 sets - 10 repsAccess Code: [email or web address] URL: [email or web address] Date: 12/17/2021 Prepared by: Candyce Churn   Exercises - Supine Hamstring Stretch with Strap  - 1 x daily - 7 x weekly - 1 sets - 3 reps - 30 sec hold - Quadricep Stretch with Chair and Counter Support  - 1 x daily - 7 x weekly - 1 sets - 3 reps - 30 sec hold   ASSESSMENT:   CLINICAL IMPRESSION: Pt tolerated treatment well, no pain and reports she feels she is getting better. Pt reports she has noticed less pain overall and is not waking at night as much due to pain. Pt session focused don hip and core strengthening with hip flexion for improved stability and  decreased pain. She would benefit from continued LE flexibility exercises and core stabilization to reduce pain and allow patient to resume prior level of function.      OBJECTIVE IMPAIRMENTS decreased activity tolerance, decreased knowledge of condition, decreased mobility, difficulty walking, decreased ROM, decreased strength, hypomobility, increased fascial restrictions, increased muscle spasms, impaired flexibility, impaired sensation, improper body mechanics, postural dysfunction, and pain.    ACTIVITY LIMITATIONS carrying, lifting, bending, sitting, standing, squatting, sleeping, stairs, transfers, bed mobility, continence, bathing, toileting, dressing, and caring for others   PARTICIPATION LIMITATIONS: meal prep, cleaning, laundry, driving, shopping, community activity, yard work, and church   PERSONAL FACTORS Age, Fitness, and 1-2 comorbidities: CAD, HTN  are also affecting patient's functional outcome.    REHAB POTENTIAL: Good   CLINICAL DECISION MAKING: Evolving/moderate complexity   EVALUATION COMPLEXITY: Moderate     GOALS: Goals reviewed with patient? Yes   SHORT TERM GOALS: Target date: 01/14/2022   Patient will be independent with initial HEP  Baseline: Goal status: INITIAL   2.  Pain report to be no greater than 4/10   Baseline:  Goal status: INITIAL   3.  Radicular symptoms to centralize  Baseline:  Goal status: INITIAL     LONG TERM GOALS: Target date: 02/11/2022   Patient to be independent with advanced HEP  Baseline:  Goal status: INITIAL   2.  Patient to report pain no greater than 2/10  Baseline:  Goal status: INITIAL   3.  Eliminate radicular symptoms Baseline:  Goal status: INITIAL   4.  Patient to report 80% improvement in overall symptoms Baseline:  Goal status: INITIAL   5.  Patient to be able to walk 1 mile without increased pain. Baseline:  Goal status: INITIAL   6.  Patient to be able to perform all household cleaning tasks and laundry without increased pain.  Baseline:  Goal status: INITIAL     PLAN: PT FREQUENCY: 2x/week   PT DURATION: 8 weeks   PLANNED INTERVENTIONS: Therapeutic exercises, Therapeutic activity, Neuromuscular re-education, Balance training, Gait training, Patient/Family education, Joint mobilization, Stair training, Aquatic Therapy, Dry Needling, Electrical stimulation, Spinal mobilization, Cryotherapy, Moist heat, Taping, Traction, Ultrasound, Ionotophoresis '4mg'$ /ml Dexamethasone, Manual therapy, and Re-evaluation.   PLAN FOR NEXT SESSION: progress core strengthening and add in LE strengthening.      Stacy Gardner, PT, DPT 07/06/235:07 PM

## 2022-01-01 ENCOUNTER — Ambulatory Visit: Payer: Medicare Other

## 2022-01-14 ENCOUNTER — Ambulatory Visit: Payer: Medicare Other

## 2022-01-14 DIAGNOSIS — M6281 Muscle weakness (generalized): Secondary | ICD-10-CM | POA: Diagnosis not present

## 2022-01-14 DIAGNOSIS — R262 Difficulty in walking, not elsewhere classified: Secondary | ICD-10-CM

## 2022-01-14 DIAGNOSIS — M5416 Radiculopathy, lumbar region: Secondary | ICD-10-CM

## 2022-01-14 DIAGNOSIS — R252 Cramp and spasm: Secondary | ICD-10-CM

## 2022-01-14 NOTE — Therapy (Signed)
OUTPATIENT PHYSICAL THERAPY TREATMENT NOTE   Patient Name: Julie Petty MRN: [id number] DOB:04/14/1932, 88 y.o., female Today's Date: 01/14/2022  PCP: Aura Dials, PA-C REFERRING PROVIDER: Burnard Hawthorne, NP  END OF SESSION:   PT End of Session - 01/14/22 1411     Visit Number 5    Date for PT Re-Evaluation 02/11/22    Authorization Type [employer]ICARE    PT Start Time [phone]    PT Stop Time 1015    PT Time Calculation (min) 42 min    Activity Tolerance Patient tolerated treatment well    Behavior During Therapy WFL for tasks assessed/performed               Past Medical History:  Diagnosis Date   Complication of anesthesia    Essential hypertension    GERD (gastroesophageal reflux disease)    Glaucoma    Hernia    Hyperlipemia    Myocardial infarction ([place])    NSTEMI 04/24/18 (40% D2 by LHC; Dx: Takotsubo CM vs coronary spasm vs myocarditis)   PONV (postoperative nausea and vomiting)    Past Surgical History:  Procedure Laterality Date   CARDIAC CATHETERIZATION  04/05/03   CHOLECYSTECTOMY N/A 08/17/2018   Procedure: Laparoscopic Cholecystectomy;  Surgeon: Erroll Luna, MD;  Location: [facility];  Service: General;  Laterality: N/A;   KNEE ARTHROSCOPY  09/17/05   left   LEFT HEART CATH AND CORONARY ANGIOGRAPHY N/A 04/26/2018   Procedure: LEFT HEART CATH AND CORONARY ANGIOGRAPHY;  Surgeon: Leonie Man, MD;  Location: [facility];  Service: Cardiovascular;  Laterality: N/A;   VAGINAL HYSTERECTOMY  1980's   Patient Active Problem List   Diagnosis Date Noted   Radiculopathy, lumbar region 12/17/2021   Lung nodule 02/03/2020   Coronary artery disease involving native coronary artery of native heart without angina pectoris 08/14/2018   Cardiomyopathy ([place])    NSTEMI (non-ST elevated myocardial infarction) ([place]) 04/24/2018   Essential hypertension 04/24/2018   Hyperlipidemia with target LDL less than 70 04/24/2018   Inguinal  hernia 01/14/2011    REFERRING DIAG: M54.16 (ICD-10-CM) - Radiculopathy, lumbar region M48.061 (ICD-10-CM) - Spinal stenosis, lumbar region without neurogenic claudication M47.816 (ICD-10-CM) - Spondylosis without myelopathy or radiculopathy, lumbar   THERAPY DIAG:  Muscle weakness (generalized)  Difficulty in walking, not elsewhere classified  Radiculopathy, lumbar region  Cramp and spasm  Rationale for Evaluation and Treatment Rehabilitation  PERTINENT HISTORY: see above  PRECAUTIONS: fall  SUBJECTIVE: pt reports "I am not doing good.  I'm about the same.  My son sent me some exercises to try. "  Exercises included some exercises on her stomach.     PAIN:  Are you having pain? No and Yes: NPRS scale: 4/10 Pain location: low back Pain description: aching Aggravating factors: standing/walking Relieving factors: flexion exercises, meds   OBJECTIVE: (objective measures completed at initial evaluation unless otherwise dated)  OBJECTIVE:    DIAGNOSTIC FINDINGS:  None found but scheduled for MRI   PATIENT SURVEYS:     SCREENING FOR RED FLAGS: Bowel or bladder incontinence: No Spinal tumors: No Cauda equina syndrome: No Compression fracture: No Abdominal aneurysm: No   COGNITION:           Overall cognitive status: Within functional limits for tasks assessed                          SENSATION: WFL   MUSCLE LENGTH: Hamstrings: Right 60 deg;  Left 60 deg Thomas test: Right pos ; Left pos    POSTURE: rounded shoulders, forward head, and increased thoracic kyphosis     LUMBAR ROM:    Active  A/PROM  eval  Flexion WNL  Extension WNL  Right lateral flexion    Left lateral flexion    Right rotation    Left rotation     (Blank rows = not tested)   LOWER EXTREMITY ROM:      WFL   LOWER EXTREMITY MMT:     Generally 4 to 4+/5   LUMBAR SPECIAL TESTS:  Straight leg raise test: Negative   FUNCTIONAL TESTS:  5 times sit to stand: 13.2s no UE  support Timed up and go (TUG): 11.43s no AD   GAIT: Distance walked: 50 Assistive device utilized: None Level of assistance: Complete Independence Comments:  short step length   TODAY'S TREATMENT: 01/14/22  Lengthy discussion and education on activities at home and how to manage certain tasks with her back pain.   Suggested specific timing for HEP.   Lengthy review of HEP:  patient was not doing all of her exercises due to confused on some of them.  Son also sent patient some exercises which included some on her stomach, spent several minutes educating patient on anterolisthesis and need to avoid extension and that some of the exercises he sent her on her stomach would place her at risk due to her anterolisthesis PPT x 20 Supine alternating arms/legs (dead bugs) 2x10 PPT with alternating 90/90 heel taps Reviewed all stretches including hamstring and quad ( patient was not doing the quad stretch because she felt like she was not doing this correctly)   TODAY'S TREATMENT: 12/26/21  Nustep 6 mins L5 (PT present to discuss progress) Supine SLR 2x10 each Supine alternating arms/legs (dead bugs) 2x10 Hooklying hip abduction green band x20  Same hand/knee ball press 2x10 Standing marching - one hand at bar 2x10 Standing opp arm/knee raises one hand at bar 2x10 Side stepping 30'x2 each leg Gait for increased bil step height and decreased forward head carriage 500' mod I with minimal cues, noted decreased lateral trunk sway with cues for increased step height.   TODAY'S TREATMENT:12/23/21  FOTO completed: score 44 best 57 Nustep 5 mins L5 (PT present to discuss progress) 5xSTS: 13.2s no UE support TUG: 11.43s no AD Standing marching with one hand at bar x20 Standing marching x20 without UE support Supine alternating arms/legs (dead bugs) 2x10 Supine SLR x20 each Hooklying hip abduction yellow band x20    TODAY'S TREATMENT :  12/19/21 NuStep x 5 min level 5 (PT present to discuss  progress) Reviewed HEP Added PPT x 20 PPT with march x 20 PPT with alternating arms and legs x 20 PPT with SLR x 20  TODAY'S TREATMENT  Initial eval completed and intiated HEP   PATIENT EDUCATION:  Education details: Added new core exercises to HEP Person educated: Patient Education method: Explanation, Demonstration, Verbal cues, and Handouts Education comprehension: verbalized understanding, returned demonstration, and verbal cues required     HOME EXERCISE PROGRAM: Access Code: [email or web address] URL: [email or web address] Date: 01/14/2022 Prepared by: Candyce Churn  Exercises - Supine Hamstring Stretch with Strap  - 1 x daily - 7 x weekly - 1 sets - 3 reps - 30 sec hold - Quadricep Stretch with Chair and Counter Support  - 1 x daily - 7 x weekly - 1 sets - 3 reps - 30 sec hold - Supine Posterior Pelvic Tilt  -  1 x daily - 7 x weekly - 3 sets - 10 reps - Supine 90/90 Alternating Heel Touches with Posterior Pelvic Tilt  - 1 x daily - 7 x weekly - 3 sets - 10 reps - Dead Bug  - 1 x daily - 7 x weekly - 3 sets - 10 reps - Supine Double Knee to Chest  - 1 x daily - 7 x weekly - 1 sets - 3 reps - 1 min hold - Standing Lumbar Spine Flexion Stretch Counter  - 1 x daily - 7 x weekly - 1 sets - 10 reps - 10 sec holdAccess Code: [email or web address] URL: [email or web address] Date: 12/19/2021 Prepared by: Candyce Churn  Exercises - Supine Hamstring Stretch with Strap  - 1 x daily - 7 x weekly - 1 sets - 3 reps - 30 sec hold - Quadricep Stretch with Chair and Counter Support  - 1 x daily - 7 x weekly - 1 sets - 3 reps - 30 sec hold - Supine Posterior Pelvic Tilt  - 1 x daily - 7 x weekly - 3 sets - 10 reps - Supine 90/90 Alternating Heel Touches with Posterior Pelvic Tilt  - 1 x daily - 7 x weekly - 3 sets - 10 reps - Dead Bug  - 1 x daily - 7 x weekly - 3 sets - 10 repsAccess Code: [email or web address] URL: [email or web address] Date: 12/17/2021 Prepared by: Candyce Churn   Exercises - Supine Hamstring Stretch with Strap  - 1 x daily - 7 x weekly - 1 sets - 3 reps - 30 sec hold - Quadricep Stretch with Chair and Counter Support  - 1 x daily - 7 x weekly - 1 sets - 3 reps - 30 sec hold   ASSESSMENT:   CLINICAL IMPRESSION: Kamarah did not report any progress since last visit.  Her son had sent her some exercises which included extension exercises.  We educated her on anterolisthesis and that extension should be avoided.  After lengthy education, patient was able to return understanding.  She is able to do all exercises today with no increase in pain.   She would benefit from continued LE flexibility exercises and core stabilization to reduce pain and allow patient to resume prior level of function.      OBJECTIVE IMPAIRMENTS decreased activity tolerance, decreased knowledge of condition, decreased mobility, difficulty walking, decreased ROM, decreased strength, hypomobility, increased fascial restrictions, increased muscle spasms, impaired flexibility, impaired sensation, improper body mechanics, postural dysfunction, and pain.    ACTIVITY LIMITATIONS carrying, lifting, bending, sitting, standing, squatting, sleeping, stairs, transfers, bed mobility, continence, bathing, toileting, dressing, and caring for others   PARTICIPATION LIMITATIONS: meal prep, cleaning, laundry, driving, shopping, community activity, yard work, and church   PERSONAL FACTORS Age, Fitness, and 1-2 comorbidities: CAD, HTN  are also affecting patient's functional outcome.    REHAB POTENTIAL: Good   CLINICAL DECISION MAKING: Evolving/moderate complexity   EVALUATION COMPLEXITY: Moderate     GOALS: Goals reviewed with patient? Yes   SHORT TERM GOALS: Target date: 01/14/2022   Patient will be independent with initial HEP  Baseline: Goal status: Progressing   2.  Pain report to be no greater than 4/10  Baseline:  Goal status: Progressing   3.  Radicular symptoms to  centralize  Baseline:  Goal status: MET     LONG TERM GOALS: Target date: 02/11/2022   Patient to be independent with advanced HEP  Baseline:  Goal status: INITIAL   2.  Patient  to report pain no greater than 2/10  Baseline:  Goal status: INITIAL   3.  Eliminate radicular symptoms Baseline:  Goal status: INITIAL   4.  Patient to report 80% improvement in overall symptoms Baseline:  Goal status: INITIAL   5.  Patient to be able to walk 1 mile without increased pain. Baseline:  Goal status: INITIAL   6.  Patient to be able to perform all household cleaning tasks and laundry without increased pain.  Baseline:  Goal status: INITIAL     PLAN: PT FREQUENCY: 2x/week   PT DURATION: 8 weeks   PLANNED INTERVENTIONS: Therapeutic exercises, Therapeutic activity, Neuromuscular re-education, Balance training, Gait training, Patient/Family education, Joint mobilization, Stair training, Aquatic Therapy, Dry Needling, Electrical stimulation, Spinal mobilization, Cryotherapy, Moist heat, Taping, Traction, Ultrasound, Ionotophoresis 72m/ml Dexamethasone, Manual therapy, and Re-evaluation.   PLAN FOR NEXT SESSION: progress core strengthening and LE strengthening.      JAnderson MaltaB. Megann Easterwood, PT 01/14/22 2:32 PM  [address] SPetreyGRichville [place] [address]Phone # [phone] [phone]

## 2022-01-17 ENCOUNTER — Ambulatory Visit: Payer: Medicare Other | Admitting: Physical Therapy

## 2022-01-20 ENCOUNTER — Encounter: Payer: Self-pay | Admitting: Physical Therapy

## 2022-01-20 ENCOUNTER — Ambulatory Visit: Payer: Medicare Other | Admitting: Physical Therapy

## 2022-01-20 DIAGNOSIS — R262 Difficulty in walking, not elsewhere classified: Secondary | ICD-10-CM

## 2022-01-20 DIAGNOSIS — R252 Cramp and spasm: Secondary | ICD-10-CM

## 2022-01-20 DIAGNOSIS — M6281 Muscle weakness (generalized): Secondary | ICD-10-CM

## 2022-01-20 DIAGNOSIS — M5416 Radiculopathy, lumbar region: Secondary | ICD-10-CM

## 2022-01-20 NOTE — Therapy (Signed)
OUTPATIENT PHYSICAL THERAPY TREATMENT NOTE   Patient Name: Julie Petty MRN: [id number] DOB:Aug 09, 1931, 88 y.o., female Today's Date: 01/20/2022  PCP: Aura Dials, PA-C REFERRING PROVIDER: Burnard Hawthorne, NP  END OF SESSION:   PT End of Session - 01/20/22 0759     Visit Number 6    Date for PT Re-Evaluation 02/11/22    Authorization Type [employer] MEDICARE    PT Start Time 0800    PT Stop Time [phone]    PT Time Calculation (min) 38 min    Activity Tolerance Patient tolerated treatment well    Behavior During Therapy [employer] for tasks assessed/performed               Past Medical History:  Diagnosis Date   Complication of anesthesia    Essential hypertension    GERD (gastroesophageal reflux disease)    Glaucoma    Hernia    Hyperlipemia    Myocardial infarction ([place])    NSTEMI 04/24/18 (40% D2 by LHC; Dx: Takotsubo CM vs coronary spasm vs myocarditis)   PONV (postoperative nausea and vomiting)    Past Surgical History:  Procedure Laterality Date   CARDIAC CATHETERIZATION  04/05/03   CHOLECYSTECTOMY N/A 08/17/2018   Procedure: Laparoscopic Cholecystectomy;  Surgeon: Erroll Luna, MD;  Location: [place];  Service: General;  Laterality: N/A;   KNEE ARTHROSCOPY  09/17/05   left   LEFT HEART CATH AND CORONARY ANGIOGRAPHY N/A 04/26/2018   Procedure: LEFT HEART CATH AND CORONARY ANGIOGRAPHY;  Surgeon: Leonie Man, MD;  Location: [place] [facility];  Service: Cardiovascular;  Laterality: N/A;   VAGINAL HYSTERECTOMY  1980's   Patient Active Problem List   Diagnosis Date Noted   Radiculopathy, lumbar region 12/17/2021   Lung nodule 02/03/2020   Coronary artery disease involving native coronary artery of native heart without angina pectoris 08/14/2018   Cardiomyopathy ([place])    NSTEMI (non-ST elevated myocardial infarction) ([place]) 04/24/2018   Essential hypertension 04/24/2018   Hyperlipidemia with target LDL less than 70 04/24/2018   Inguinal  hernia 01/14/2011    REFERRING DIAG: M54.16 (ICD-10-CM) - Radiculopathy, lumbar region M48.061 (ICD-10-CM) - Spinal stenosis, lumbar region without neurogenic claudication M47.816 (ICD-10-CM) - Spondylosis without myelopathy or radiculopathy, lumbar   THERAPY DIAG:  Muscle weakness (generalized)  Difficulty in walking, not elsewhere classified  Radiculopathy, lumbar region  Cramp and spasm  Rationale for Evaluation and Treatment Rehabilitation  PERTINENT HISTORY: see above  PRECAUTIONS: fall  SUBJECTIVE: I had an injection last Wednesday. Unsure if she feels any difference yet, she thinks she is sore from the needles.      PAIN:  Are you having pain? No and Yes: NPRS scale: 4/10 Pain location: low back Pain description: aching Aggravating factors: standing/walking Relieving factors: flexion exercises, meds   OBJECTIVE: (objective measures completed at initial evaluation unless otherwise dated)  OBJECTIVE:    DIAGNOSTIC FINDINGS:  None found but scheduled for MRI   PATIENT SURVEYS:     SCREENING FOR RED FLAGS: Bowel or bladder incontinence: No Spinal tumors: No Cauda equina syndrome: No Compression fracture: No Abdominal aneurysm: No   COGNITION:           Overall cognitive status: Within functional limits for tasks assessed                          SENSATION: WFL   MUSCLE LENGTH: Hamstrings: Right 60 deg; Left 60 deg Thomas test: Right pos ;  Left pos    POSTURE: rounded shoulders, forward head, and increased thoracic kyphosis     LUMBAR ROM:    Active  A/PROM  eval  Flexion WNL  Extension WNL  Right lateral flexion    Left lateral flexion    Right rotation    Left rotation     (Blank rows = not tested)   LOWER EXTREMITY ROM:      WFL   LOWER EXTREMITY MMT:     Generally 4 to 4+/5   LUMBAR SPECIAL TESTS:  Straight leg raise test: Negative   FUNCTIONAL TESTS:  5 times sit to stand: 13.2s no UE support Timed up and go (TUG): 11.43s no  AD   GAIT: Distance walked: 50 Assistive device utilized: None Level of assistance: Complete Independence Comments:  short step length   TODAY'S TREATMENT:   01/20/22: Supine in hooklying wiith MHP to back: TA contraction with Pilates breath: 2x5, first set PTA hands on lower abs, second set had pt put her hands on her lower abs to feel contraction. TA with sinlge leg clamshell 2x5 bil with PTA cuing TA with hands TA with tiny, slow steps 2x5 each side: TC at TA Isometric hip flexion Bil 3 sec hold 3x Bil Supine Single Knee to chest 3x 10 sec Bil Nutsep 5 min L2  with PTA present   01/14/22  Lengthy discussion and education on activities at home and how to manage certain tasks with her back pain.   Suggested specific timing for HEP.   Lengthy review of HEP:  patient was not doing all of her exercises due to confused on some of them.  Son also sent patient some exercises which included some on her stomach, spent several minutes educating patient on anterolisthesis and need to avoid extension and that some of the exercises he sent her on her stomach would place her at risk due to her anterolisthesis PPT x 20 Supine alternating arms/legs (dead bugs) 2x10 PPT with alternating 90/90 heel taps Reviewed all stretches including hamstring and quad ( patient was not doing the quad stretch because she felt like she was not doing this correctly)   TODAY'S TREATMENT: 12/26/21  Nustep 6 mins L5 (PT present to discuss progress) Supine SLR 2x10 each Supine alternating arms/legs (dead bugs) 2x10 Hooklying hip abduction green band x20  Same hand/knee ball press 2x10 Standing marching - one hand at bar 2x10 Standing opp arm/knee raises one hand at bar 2x10 Side stepping 30'x2 each leg Gait for increased bil step height and decreased forward head carriage 500' mod I with minimal cues, noted decreased lateral trunk sway with cues for increased step height.   TODAY'S TREATMENT  Initial eval completed  and intiated HEP   PATIENT EDUCATION:  Education details: Added new core exercises to HEP Person educated: Patient Education method: Explanation, Demonstration, Verbal cues, and Handouts Education comprehension: verbalized understanding, returned demonstration, and verbal cues required     HOME EXERCISE PROGRAM: Access Code: [email or web address] URL: [email or web address] Date: 01/14/2022 Prepared by: Candyce Churn  Exercises - Supine Hamstring Stretch with Strap  - 1 x daily - 7 x weekly - 1 sets - 3 reps - 30 sec hold - Quadricep Stretch with Chair and Counter Support  - 1 x daily - 7 x weekly - 1 sets - 3 reps - 30 sec hold - Supine Posterior Pelvic Tilt  - 1 x daily - 7 x weekly - 3 sets - 10 reps - Supine 90/90 Alternating Heel Touches  with Posterior Pelvic Tilt  - 1 x daily - 7 x weekly - 3 sets - 10 reps - Dead Bug  - 1 x daily - 7 x weekly - 3 sets - 10 reps - Supine Double Knee to Chest  - 1 x daily - 7 x weekly - 1 sets - 3 reps - 1 min hold - Standing Lumbar Spine Flexion Stretch Counter  - 1 x daily - 7 x weekly - 1 sets - 10 reps - 10 sec holdAccess Code: [email or web address] URL: [email or web address] Date: 12/19/2021 Prepared by: Candyce Churn  Exercises - Supine Hamstring Stretch with Strap  - 1 x daily - 7 x weekly - 1 sets - 3 reps - 30 sec hold - Quadricep Stretch with Chair and Counter Support  - 1 x daily - 7 x weekly - 1 sets - 3 reps - 30 sec hold - Supine Posterior Pelvic Tilt  - 1 x daily - 7 x weekly - 3 sets - 10 reps - Supine 90/90 Alternating Heel Touches with Posterior Pelvic Tilt  - 1 x daily - 7 x weekly - 3 sets - 10 reps - Dead Bug  - 1 x daily - 7 x weekly - 3 sets - 10 repsAccess Code: [email or web address] URL: [email or web address] Date: 12/17/2021 Prepared by: Candyce Churn   Exercises - Supine Hamstring Stretch with Strap  - 1 x daily - 7 x weekly - 1 sets - 3 reps - 30 sec hold - Quadricep Stretch with Chair and Counter  Support  - 1 x daily - 7 x weekly - 1 sets - 3 reps - 30 sec hold   ASSESSMENT:   CLINICAL IMPRESSION: Pt arrives with mild back pain. She received a back injection last Wednesday and feels her current soreness is from the needles possibly. Pt demonstrates weakness in her ability to contract her TA.      OBJECTIVE IMPAIRMENTS decreased activity tolerance, decreased knowledge of condition, decreased mobility, difficulty walking, decreased ROM, decreased strength, hypomobility, increased fascial restrictions, increased muscle spasms, impaired flexibility, impaired sensation, improper body mechanics, postural dysfunction, and pain.    ACTIVITY LIMITATIONS carrying, lifting, bending, sitting, standing, squatting, sleeping, stairs, transfers, bed mobility, continence, bathing, toileting, dressing, and caring for others   PARTICIPATION LIMITATIONS: meal prep, cleaning, laundry, driving, shopping, community activity, yard work, and church   PERSONAL FACTORS Age, Fitness, and 1-2 comorbidities: CAD, HTN  are also affecting patient's functional outcome.    REHAB POTENTIAL: Good   CLINICAL DECISION MAKING: Evolving/moderate complexity   EVALUATION COMPLEXITY: Moderate     GOALS: Goals reviewed with patient? Yes   SHORT TERM GOALS: Target date: 01/14/2022   Patient will be independent with initial HEP  Baseline: Goal status: Progressing   2.  Pain report to be no greater than 4/10  Baseline:  Goal status: Progressing   3.  Radicular symptoms to centralize  Baseline:  Goal status: MET     LONG TERM GOALS: Target date: 02/11/2022   Patient to be independent with advanced HEP  Baseline:  Goal status: INITIAL   2.  Patient to report pain no greater than 2/10  Baseline:  Goal status: INITIAL   3.  Eliminate radicular symptoms Baseline:  Goal status: INITIAL   4.  Patient to report 80% improvement in overall symptoms Baseline:  Goal status: INITIAL   5.  Patient to be able to  walk 1 mile without increased pain. Baseline:  Goal status: INITIAL   6.  Patient to  be able to perform all household cleaning tasks and laundry without increased pain.  Baseline:  Goal status: INITIAL     PLAN: PT FREQUENCY: 2x/week   PT DURATION: 8 weeks   PLANNED INTERVENTIONS: Therapeutic exercises, Therapeutic activity, Neuromuscular re-education, Balance training, Gait training, Patient/Family education, Joint mobilization, Stair training, Aquatic Therapy, Dry Needling, Electrical stimulation, Spinal mobilization, Cryotherapy, Moist heat, Taping, Traction, Ultrasound, Ionotophoresis 19m/ml Dexamethasone, Manual therapy, and Re-evaluation.   PLAN FOR NEXT SESSION: progress core strengthening and LE strengthening.     JMyrene Galas PTA 01/20/22 8:33 AM   [address] SPiersonGPort Clinton [place] [address]Phone # [phone] [phone]

## 2022-01-24 ENCOUNTER — Ambulatory Visit: Payer: Medicare Other | Attending: Nurse Practitioner | Admitting: Physical Therapy

## 2022-01-24 ENCOUNTER — Encounter: Payer: Self-pay | Admitting: Physical Therapy

## 2022-01-24 DIAGNOSIS — R262 Difficulty in walking, not elsewhere classified: Secondary | ICD-10-CM | POA: Insufficient documentation

## 2022-01-24 DIAGNOSIS — R252 Cramp and spasm: Secondary | ICD-10-CM | POA: Diagnosis present

## 2022-01-24 DIAGNOSIS — M6281 Muscle weakness (generalized): Secondary | ICD-10-CM | POA: Diagnosis present

## 2022-01-24 DIAGNOSIS — M5416 Radiculopathy, lumbar region: Secondary | ICD-10-CM | POA: Diagnosis present

## 2022-01-24 NOTE — Therapy (Signed)
OUTPATIENT PHYSICAL THERAPY TREATMENT NOTE   Patient Name: Julie Petty MRN: [id number] DOB:1931/12/12, 88 y.o., female Today's Date: 01/24/2022  PCP: Aura Dials, PA-C REFERRING PROVIDER: Burnard Hawthorne, NP  END OF SESSION:   PT End of Session - 01/24/22 0804     Visit Number 7    Date for PT Re-Evaluation 02/11/22    Authorization Type [employer] MEDICARE    PT Start Time 0800    PT Stop Time [phone]    PT Time Calculation (min) 41 min    Activity Tolerance Patient tolerated treatment well    Behavior During Therapy [facility] for tasks assessed/performed                Past Medical History:  Diagnosis Date   Complication of anesthesia    Essential hypertension    GERD (gastroesophageal reflux disease)    Glaucoma    Hernia    Hyperlipemia    Myocardial infarction ([place])    NSTEMI 04/24/18 (40% D2 by LHC; Dx: Takotsubo CM vs coronary spasm vs myocarditis)   PONV (postoperative nausea and vomiting)    Past Surgical History:  Procedure Laterality Date   CARDIAC CATHETERIZATION  04/05/03   CHOLECYSTECTOMY N/A 08/17/2018   Procedure: Laparoscopic Cholecystectomy;  Surgeon: Erroll Luna, MD;  Location: [place];  Service: General;  Laterality: N/A;   KNEE ARTHROSCOPY  09/17/05   left   LEFT HEART CATH AND CORONARY ANGIOGRAPHY N/A 04/26/2018   Procedure: LEFT HEART CATH AND CORONARY ANGIOGRAPHY;  Surgeon: Leonie Man, MD;  Location: [place] [facility];  Service: Cardiovascular;  Laterality: N/A;   VAGINAL HYSTERECTOMY  1980's   Patient Active Problem List   Diagnosis Date Noted   Radiculopathy, lumbar region 12/17/2021   Lung nodule 02/03/2020   Coronary artery disease involving native coronary artery of native heart without angina pectoris 08/14/2018   Cardiomyopathy ([place])    NSTEMI (non-ST elevated myocardial infarction) ([place]) 04/24/2018   Essential hypertension 04/24/2018   Hyperlipidemia with target LDL less than 70 04/24/2018   Inguinal  hernia 01/14/2011    REFERRING DIAG: M54.16 (ICD-10-CM) - Radiculopathy, lumbar region M48.061 (ICD-10-CM) - Spinal stenosis, lumbar region without neurogenic claudication M47.816 (ICD-10-CM) - Spondylosis without myelopathy or radiculopathy, lumbar   THERAPY DIAG:  Muscle weakness (generalized)  Difficulty in walking, not elsewhere classified  Radiculopathy, lumbar region  Cramp and spasm  Rationale for Evaluation and Treatment Rehabilitation  PERTINENT HISTORY: see above  PRECAUTIONS: fall  SUBJECTIVE: I have had a very good week, best yet.  PAIN:  Are you having pain? None right now   OBJECTIVE:    DIAGNOSTIC FINDINGS:  None found but scheduled for MRI   PATIENT SURVEYS:     SCREENING FOR RED FLAGS: Bowel or bladder incontinence: No Spinal tumors: No Cauda equina syndrome: No Compression fracture: No Abdominal aneurysm: No   COGNITION:           Overall cognitive status: Within functional limits for tasks assessed                          SENSATION: WFL   MUSCLE LENGTH: Hamstrings: Right 60 deg; Left 60 deg Thomas test: Right pos ; Left pos    POSTURE: rounded shoulders, forward head, and increased thoracic kyphosis     LUMBAR ROM:    Active  A/PROM  eval  Flexion WNL  Extension WNL  Right lateral flexion    Left lateral flexion  Right rotation    Left rotation     (Blank rows = not tested)   LOWER EXTREMITY ROM:      WFL   LOWER EXTREMITY MMT:     Generally 4 to 4+/5   LUMBAR SPECIAL TESTS:  Straight leg raise test: Negative   FUNCTIONAL TESTS:  5 times sit to stand: 13.2s no UE support Timed up and go (TUG): 11.43s no AD   GAIT: Distance walked: 50 Assistive device utilized: None Level of assistance: Complete Independence Comments:  short step length   TODAY'S TREATMENT:   01/24/22:Supine in hooklying wiith MHP to back: TA contraction with Pilates breath: 2x5, pt provided her own TC with her hands on TA. No assist from PTA  today. TA with red band clamshell 2x10: gave pt band for home.  TA with tiny, slow steps 2x5 each side: TC at TA Isometric hip flexion Bil 3 sec hold 5x Bil Supine Single Knee to chest 3x 10 sec Bil Nutsep 6 min L2  with PTA present Supine bridge: 3 sec hold 2x5 Sit  to stand 10x no UE  01/20/22: Supine in hooklying wiith MHP to back: TA contraction with Pilates breath: 2x5, first set PTA hands on lower abs, second set had pt put her hands on her lower abs to feel contraction. TA with sinlge leg clamshell 2x5 bil with PTA cuing TA with hands TA with tiny, slow steps 2x5 each side: TC at TA Isometric hip flexion Bil 3 sec hold 3x Bil Supine Single Knee to chest 3x 10 sec Bil Nutsep 5 min L2  with PTA present   01/14/22  Lengthy discussion and education on activities at home and how to manage certain tasks with her back pain.   Suggested specific timing for HEP.   Lengthy review of HEP:  patient was not doing all of her exercises due to confused on some of them.  Son also sent patient some exercises which included some on her stomach, spent several minutes educating patient on anterolisthesis and need to avoid extension and that some of the exercises he sent her on her stomach would place her at risk due to her anterolisthesis PPT x 20 Supine alternating arms/legs (dead bugs) 2x10 PPT with alternating 90/90 heel taps Reviewed all stretches including hamstring and quad ( patient was not doing the quad stretch because she felt like she was not doing this correctly)    TODAY'S TREATMENT  Initial eval completed and intiated HEP   PATIENT EDUCATION:  Education details: Added new core exercises to HEP Person educated: Patient Education method: Explanation, Demonstration, Verbal cues, and Handouts Education comprehension: verbalized understanding, returned demonstration, and verbal cues required     HOME EXERCISE PROGRAM: Access Code: [email or web address] URL:  [email or web address] Date: 01/14/2022 Prepared by: Candyce Churn  Exercises - Supine Hamstring Stretch with Strap  - 1 x daily - 7 x weekly - 1 sets - 3 reps - 30 sec hold - Quadricep Stretch with Chair and Counter Support  - 1 x daily - 7 x weekly - 1 sets - 3 reps - 30 sec hold - Supine Posterior Pelvic Tilt  - 1 x daily - 7 x weekly - 3 sets - 10 reps - Supine 90/90 Alternating Heel Touches with Posterior Pelvic Tilt  - 1 x daily - 7 x weekly - 3 sets - 10 reps - Dead Bug  - 1 x daily - 7 x weekly - 3 sets - 10 reps - Supine Double Knee to  Chest  - 1 x daily - 7 x weekly - 1 sets - 3 reps - 1 min hold - Standing Lumbar Spine Flexion Stretch Counter  - 1 x daily - 7 x weekly - 1 sets - 10 reps - 10 sec holdAccess Code: [email or web address] URL: [email or web address] Date: 12/19/2021 Prepared by: Candyce Churn  Exercises - Supine Hamstring Stretch with Strap  - 1 x daily - 7 x weekly - 1 sets - 3 reps - 30 sec hold - Quadricep Stretch with Chair and Counter Support  - 1 x daily - 7 x weekly - 1 sets - 3 reps - 30 sec hold - Supine Posterior Pelvic Tilt  - 1 x daily - 7 x weekly - 3 sets - 10 reps - Supine 90/90 Alternating Heel Touches with Posterior Pelvic Tilt  - 1 x daily - 7 x weekly - 3 sets - 10 reps - Dead Bug  - 1 x daily - 7 x weekly - 3 sets - 10 repsAccess Code: [email or web address] URL: [email or web address] Date: 12/17/2021 Prepared by: Candyce Churn   Exercises - Supine Hamstring Stretch with Strap  - 1 x daily - 7 x weekly - 1 sets - 3 reps - 30 sec hold - Quadricep Stretch with Chair and Counter Support  - 1 x daily - 7 x weekly - 1 sets - 3 reps - 30 sec hold  01/24/22: added supine bridge, isometric hip flexion and sit to stand to HEP: See Medbridge for details ASSESSMENT:   CLINICAL IMPRESSION: Pt arrives with no pain and reports essentially no pain since Monday. She requested LE specific exercises to add to HEP today. Pt had no issue with  any new exercises today. Pt even baked a pound cake yesterday which is her example of doing better.    OBJECTIVE IMPAIRMENTS decreased activity tolerance, decreased knowledge of condition, decreased mobility, difficulty walking, decreased ROM, decreased strength, hypomobility, increased fascial restrictions, increased muscle spasms, impaired flexibility, impaired sensation, improper body mechanics, postural dysfunction, and pain.    ACTIVITY LIMITATIONS carrying, lifting, bending, sitting, standing, squatting, sleeping, stairs, transfers, bed mobility, continence, bathing, toileting, dressing, and caring for others   PARTICIPATION LIMITATIONS: meal prep, cleaning, laundry, driving, shopping, community activity, yard work, and church   PERSONAL FACTORS Age, Fitness, and 1-2 comorbidities: CAD, HTN  are also affecting patient's functional outcome.    REHAB POTENTIAL: Good   CLINICAL DECISION MAKING: Evolving/moderate complexity   EVALUATION COMPLEXITY: Moderate     GOALS: Goals reviewed with patient? Yes   SHORT TERM GOALS: Target date: 01/14/2022   Patient will be independent with initial HEP  Baseline: Goal status: Progressing   2.  Pain report to be no greater than 4/10  Baseline:  Goal status: Progressing   3.  Radicular symptoms to centralize  Baseline:  Goal status: MET     LONG TERM GOALS: Target date: 02/11/2022   Patient to be independent with advanced HEP  Baseline:  Goal status: INITIAL   2.  Patient to report pain no greater than 2/10  Baseline:  Goal status: INITIAL   3.  Eliminate radicular symptoms Baseline:  Goal status: INITIAL   4.  Patient to report 80% improvement in overall symptoms Baseline:  Goal status: INITIAL   5.  Patient to be able to walk 1 mile without increased pain. Baseline:  Goal status: INITIAL   6.  Patient to be able to perform all household cleaning tasks and laundry without increased pain.  Baseline:  Goal  status: INITIAL      PLAN: PT FREQUENCY: 2x/week   PT DURATION: 8 weeks   PLANNED INTERVENTIONS: Therapeutic exercises, Therapeutic activity, Neuromuscular re-education, Balance training, Gait training, Patient/Family education, Joint mobilization, Stair training, Aquatic Therapy, Dry Needling, Electrical stimulation, Spinal mobilization, Cryotherapy, Moist heat, Taping, Traction, Ultrasound, Ionotophoresis 66m/ml Dexamethasone, Manual therapy, and Re-evaluation.   PLAN FOR NEXT SESSION: review of new exercises given today, progress core strengthening and LE strengthening.     JMyrene Galas PTA 01/24/22 8:42 AM   [facility] [address] SNeapolis[place]astview [place] [address]Phone # [phone] [phone]

## 2022-01-28 ENCOUNTER — Ambulatory Visit: Payer: Medicare Other

## 2022-01-28 DIAGNOSIS — R262 Difficulty in walking, not elsewhere classified: Secondary | ICD-10-CM

## 2022-01-28 DIAGNOSIS — M5416 Radiculopathy, lumbar region: Secondary | ICD-10-CM

## 2022-01-28 DIAGNOSIS — R252 Cramp and spasm: Secondary | ICD-10-CM

## 2022-01-28 DIAGNOSIS — M6281 Muscle weakness (generalized): Secondary | ICD-10-CM | POA: Diagnosis not present

## 2022-01-28 NOTE — Therapy (Signed)
OUTPATIENT PHYSICAL THERAPY TREATMENT NOTE   Patient Name: Julie Petty MRN: [id number] DOB:September 11, 1931, 88 y.o., female Today's Date: 01/28/2022  PCP: Aura Dials, PA-C REFERRING PROVIDER: Burnard Hawthorne, NP  END OF SESSION:   PT End of Session - 01/28/22 1113     Visit Number 8    Date for PT Re-Evaluation 02/11/22    Authorization Type [employer]ICARE    PT Start Time 1106    PT Stop Time 1145    PT Time Calculation (min) 39 min    Activity Tolerance Patient tolerated treatment well    Behavior During Therapy WFL for tasks assessed/performed                Past Medical History:  Diagnosis Date   Complication of anesthesia    Essential hypertension    GERD (gastroesophageal reflux disease)    Glaucoma    Hernia    Hyperlipemia    Myocardial infarction ([place])    NSTEMI 04/24/18 (40% D2 by LHC; Dx: Takotsubo CM vs coronary spasm vs myocarditis)   PONV (postoperative nausea and vomiting)    Past Surgical History:  Procedure Laterality Date   CARDIAC CATHETERIZATION  04/05/03   CHOLECYSTECTOMY N/A 08/17/2018   Procedure: Laparoscopic Cholecystectomy;  Surgeon: Erroll Luna, MD;  Location: [facility];  Service: General;  Laterality: N/A;   KNEE ARTHROSCOPY  09/17/05   left   LEFT HEART CATH AND CORONARY ANGIOGRAPHY N/A 04/26/2018   Procedure: LEFT HEART CATH AND CORONARY ANGIOGRAPHY;  Surgeon: Leonie Man, MD;  Location: [facility];  Service: Cardiovascular;  Laterality: N/A;   VAGINAL HYSTERECTOMY  1980's   Patient Active Problem List   Diagnosis Date Noted   Radiculopathy, lumbar region 12/17/2021   Lung nodule 02/03/2020   Coronary artery disease involving native coronary artery of native heart without angina pectoris 08/14/2018   Cardiomyopathy ([place])    NSTEMI (non-ST elevated myocardial infarction) ([place]) 04/24/2018   Essential hypertension 04/24/2018   Hyperlipidemia with target LDL less than 70 04/24/2018   Inguinal  hernia 01/14/2011    REFERRING DIAG: M54.16 (ICD-10-CM) - Radiculopathy, lumbar region M48.061 (ICD-10-CM) - Spinal stenosis, lumbar region without neurogenic claudication M47.816 (ICD-10-CM) - Spondylosis without myelopathy or radiculopathy, lumbar   THERAPY DIAG:  Muscle weakness (generalized)  Difficulty in walking, not elsewhere classified  Radiculopathy, lumbar region  Cramp and spasm  Rationale for Evaluation and Treatment Rehabilitation  PERTINENT HISTORY: see above  PRECAUTIONS: fall  SUBJECTIVE: "I'm doing good but I woke up with more pain this morning.  But I think I kind of overdid it yesterday"  PAIN:  Are you having pain? None right now   OBJECTIVE:    DIAGNOSTIC FINDINGS:  None found but scheduled for MRI   PATIENT SURVEYS:     SCREENING FOR RED FLAGS: Bowel or bladder incontinence: No Spinal tumors: No Cauda equina syndrome: No Compression fracture: No Abdominal aneurysm: No   COGNITION:           Overall cognitive status: Within functional limits for tasks assessed                          SENSATION: WFL   MUSCLE LENGTH: Hamstrings: Right 60 deg; Left 60 deg Thomas test: Right pos ; Left pos    POSTURE: rounded shoulders, forward head, and increased thoracic kyphosis     LUMBAR ROM:    Active  A/PROM  eval  Flexion WNL  Extension WNL  Right lateral flexion    Left lateral flexion    Right rotation    Left rotation     (Blank rows = not tested)   LOWER EXTREMITY ROM:      WFL   LOWER EXTREMITY MMT:     Generally 4 to 4+/5   LUMBAR SPECIAL TESTS:  Straight leg raise test: Negative   FUNCTIONAL TESTS:  5 times sit to stand: 13.2s no UE support Timed up and go (TUG): 11.43s no AD   GAIT: Distance walked: 50 Assistive device utilized: None Level of assistance: Complete Independence Comments:  short step length   TODAY'S TREATMENT:   01/28/22: Nustep x 5 min level 5  Hamstring stretching supine 3 x 30 sec both IT  band/lateral hip stretch 3 x 20 sec both Hook lying trunk rotation x 20 Supine Single Knee to chest 3x 10 sec Bil Supine double knee to chest 3 x 10 sec TA with blue loop clamshell 2x10:   Sidelying clam with blue loop 2 x 10 both Hooklying PPT with heel alternating heel taps Educated patient on importance of flexibility along with core strength   01/24/22:Supine in hooklying wiith MHP to back: TA contraction with Pilates breath: 2x5, pt provided her own TC with her hands on TA. No assist from PTA today. TA with red band clamshell 2x10: gave pt band for home.  TA with tiny, slow steps 2x5 each side: TC at TA Isometric hip flexion Bil 3 sec hold 5x Bil Supine Single Knee to chest 3x 10 sec Bil Nutsep 6 min L2  with PTA present Supine bridge: 3 sec hold 2x5 Sit  to stand 10x no UE  01/20/22: Supine in hooklying wiith MHP to back: TA contraction with Pilates breath: 2x5, first set PTA hands on lower abs, second set had pt put her hands on her lower abs to feel contraction. TA with sinlge leg clamshell 2x5 bil with PTA cuing TA with hands TA with tiny, slow steps 2x5 each side: TC at TA Isometric hip flexion Bil 3 sec hold 3x Bil Supine Single Knee to chest 3x 10 sec Bil Nutsep 5 min L2  with PTA present    PATIENT EDUCATION:  Education details: Added new core exercises to HEP Person educated: Patient Education method: Explanation, Demonstration, Verbal cues, and Handouts Education comprehension: verbalized understanding, returned demonstration, and verbal cues required     HOME EXERCISE PROGRAM: Access Code: [email or web address] URL: [email or web address] Date: 01/14/2022 Prepared by: Candyce Churn  Exercises - Supine Hamstring Stretch with Strap  - 1 x daily - 7 x weekly - 1 sets - 3 reps - 30 sec hold - Quadricep Stretch with Chair and Counter Support  - 1 x daily - 7 x weekly - 1 sets - 3 reps - 30 sec hold - Supine Posterior Pelvic Tilt  - 1 x daily - 7 x weekly - 3 sets  - 10 reps - Supine 90/90 Alternating Heel Touches with Posterior Pelvic Tilt  - 1 x daily - 7 x weekly - 3 sets - 10 reps - Dead Bug  - 1 x daily - 7 x weekly - 3 sets - 10 reps - Supine Double Knee to Chest  - 1 x daily - 7 x weekly - 1 sets - 3 reps - 1 min hold - Standing Lumbar Spine Flexion Stretch Counter  - 1 x daily - 7 x weekly - 1 sets - 10 reps - 10 sec holdAccess Code: [email or web address]  URL: [email or web address] Date: 12/19/2021 Prepared by: Candyce Churn  Exercises - Supine Hamstring Stretch with Strap  - 1 x daily - 7 x weekly - 1 sets - 3 reps - 30 sec hold - Quadricep Stretch with Chair and Counter Support  - 1 x daily - 7 x weekly - 1 sets - 3 reps - 30 sec hold - Supine Posterior Pelvic Tilt  - 1 x daily - 7 x weekly - 3 sets - 10 reps - Supine 90/90 Alternating Heel Touches with Posterior Pelvic Tilt  - 1 x daily - 7 x weekly - 3 sets - 10 reps - Dead Bug  - 1 x daily - 7 x weekly - 3 sets - 10 repsAccess Code: [email or web address] URL: [email or web address] Date: 12/17/2021 Prepared by: Candyce Churn   Exercises - Supine Hamstring Stretch with Strap  - 1 x daily - 7 x weekly - 1 sets - 3 reps - 30 sec hold - Quadricep Stretch with Chair and Counter Support  - 1 x daily - 7 x weekly - 1 sets - 3 reps - 30 sec hold  01/24/22: added supine bridge, isometric hip flexion and sit to stand to HEP: See Medbridge for details ASSESSMENT:   CLINICAL IMPRESSION: Harshitha is progressing appropriately.  She woke with some pain this morning but she admits she was carrying something heavy yesterday and working around the house and then did all of her exercises.  She has reported overall reduction in pain since starting therapy and having injection.  She is compliant with her HEP and well motivated.  She should continue to improve.      OBJECTIVE IMPAIRMENTS decreased activity tolerance, decreased knowledge of condition, decreased mobility, difficulty walking, decreased ROM,  decreased strength, hypomobility, increased fascial restrictions, increased muscle spasms, impaired flexibility, impaired sensation, improper body mechanics, postural dysfunction, and pain.    ACTIVITY LIMITATIONS carrying, lifting, bending, sitting, standing, squatting, sleeping, stairs, transfers, bed mobility, continence, bathing, toileting, dressing, and caring for others   PARTICIPATION LIMITATIONS: meal prep, cleaning, laundry, driving, shopping, community activity, yard work, and church   PERSONAL FACTORS Age, Fitness, and 1-2 comorbidities: CAD, HTN  are also affecting patient's functional outcome.    REHAB POTENTIAL: Good   CLINICAL DECISION MAKING: Evolving/moderate complexity   EVALUATION COMPLEXITY: Moderate     GOALS: Goals reviewed with patient? Yes   SHORT TERM GOALS: Target date: 01/14/2022   Patient will be independent with initial HEP  Baseline: Goal status: Progressing   2.  Pain report to be no greater than 4/10  Baseline:  Goal status: Progressing   3.  Radicular symptoms to centralize  Baseline:  Goal status: MET     LONG TERM GOALS: Target date: 02/11/2022   Patient to be independent with advanced HEP  Baseline:  Goal status: INITIAL   2.  Patient to report pain no greater than 2/10  Baseline:  Goal status: INITIAL   3.  Eliminate radicular symptoms Baseline:  Goal status: Met 01-28-22   4.  Patient to report 80% improvement in overall symptoms Baseline:  Goal status: INITIAL   5.  Patient to be able to walk 1 mile without increased pain. Baseline:  Goal status: INITIAL   6.  Patient to be able to perform all household cleaning tasks and laundry without increased pain.  Baseline:  Goal status: INITIAL     PLAN: PT FREQUENCY: 2x/week   PT DURATION: 8 weeks   PLANNED INTERVENTIONS: Therapeutic exercises, Therapeutic activity, Neuromuscular re-education, Balance  training, Gait training, Patient/Family education, Joint mobilization,  Stair training, Aquatic Therapy, Dry Needling, Electrical stimulation, Spinal mobilization, Cryotherapy, Moist heat, Taping, Traction, Ultrasound, Ionotophoresis 12m/ml Dexamethasone, Manual therapy, and Re-evaluation.   PLAN FOR NEXT SESSION: review of new exercises given today, progress core strengthening and LE strengthening.     JAnderson MaltaB. Moiz Ryant, PT 01/28/22 10:08 PM   [facility] [address] [place]ia[place]ana [place] [address]Phone # [phone] [phone]

## 2022-01-30 ENCOUNTER — Ambulatory Visit: Payer: Medicare Other

## 2022-01-30 DIAGNOSIS — M6281 Muscle weakness (generalized): Secondary | ICD-10-CM

## 2022-01-30 DIAGNOSIS — R252 Cramp and spasm: Secondary | ICD-10-CM

## 2022-01-30 DIAGNOSIS — R262 Difficulty in walking, not elsewhere classified: Secondary | ICD-10-CM

## 2022-01-30 DIAGNOSIS — M5416 Radiculopathy, lumbar region: Secondary | ICD-10-CM

## 2022-01-30 NOTE — Therapy (Signed)
OUTPATIENT PHYSICAL THERAPY TREATMENT NOTE   Patient Name: Julie Petty MRN: [id number] DOB:1932/03/13, 88 y.o., female Today's Date: 01/30/2022  PCP: Aura Dials, PA-C REFERRING PROVIDER: Burnard Hawthorne, NP  END OF SESSION:   PT End of Session - 01/30/22 0852     Visit Number 9    Date for PT Re-Evaluation 02/11/22    Authorization Type [employer] MEDICARE    Progress Note Due on Visit 10    PT Start Time [phone]    PT Stop Time 0930    PT Time Calculation (min) 43 min    Activity Tolerance Patient tolerated treatment well    Behavior During Therapy [facility] for tasks assessed/performed                Past Medical History:  Diagnosis Date   Complication of anesthesia    Essential hypertension    GERD (gastroesophageal reflux disease)    Glaucoma    Hernia    Hyperlipemia    Myocardial infarction ([place])    NSTEMI 04/24/18 (40% D2 by LHC; Dx: Takotsubo CM vs coronary spasm vs myocarditis)   PONV (postoperative nausea and vomiting)    Past Surgical History:  Procedure Laterality Date   CARDIAC CATHETERIZATION  04/05/03   CHOLECYSTECTOMY N/A 08/17/2018   Procedure: Laparoscopic Cholecystectomy;  Surgeon: Erroll Luna, MD;  Location: [facility];  Service: General;  Laterality: N/A;   KNEE ARTHROSCOPY  09/17/05   left   LEFT HEART CATH AND CORONARY ANGIOGRAPHY N/A 04/26/2018   Procedure: LEFT HEART CATH AND CORONARY ANGIOGRAPHY;  Surgeon: Leonie Man, MD;  Location: [facility];  Service: Cardiovascular;  Laterality: N/A;   VAGINAL HYSTERECTOMY  1980's   Patient Active Problem List   Diagnosis Date Noted   Radiculopathy, lumbar region 12/17/2021   Lung nodule 02/03/2020   Coronary artery disease involving native coronary artery of native heart without angina pectoris 08/14/2018   Cardiomyopathy ([place])    NSTEMI (non-ST elevated myocardial infarction) ([place]) 04/24/2018   Essential hypertension 04/24/2018   Hyperlipidemia with target LDL  less than 70 04/24/2018   Inguinal hernia 01/14/2011    REFERRING DIAG: M54.16 (ICD-10-CM) - Radiculopathy, lumbar region M48.061 (ICD-10-CM) - Spinal stenosis, lumbar region without neurogenic claudication M47.816 (ICD-10-CM) - Spondylosis without myelopathy or radiculopathy, lumbar   THERAPY DIAG:  Muscle weakness (generalized)  Difficulty in walking, not elsewhere classified  Radiculopathy, lumbar region  Cramp and spasm  Rationale for Evaluation and Treatment Rehabilitation  PERTINENT HISTORY: see above  PRECAUTIONS: fall  SUBJECTIVE: No new complaints.  "I was able to ride my bike for a while yesterday"  "My legs still cramp but I have been using mustard to help with this"   PAIN:  Are you having pain? None right now   OBJECTIVE:    DIAGNOSTIC FINDINGS:  None found but scheduled for MRI   PATIENT SURVEYS:     SCREENING FOR RED FLAGS: Bowel or bladder incontinence: No Spinal tumors: No Cauda equina syndrome: No Compression fracture: No Abdominal aneurysm: No   COGNITION:           Overall cognitive status: Within functional limits for tasks assessed                          SENSATION: WFL   MUSCLE LENGTH: Hamstrings: Right 60 deg; Left 60 deg Thomas test: Right pos ; Left pos    POSTURE: rounded shoulders, forward head, and increased thoracic  kyphosis     LUMBAR ROM:    Active  A/PROM  eval  Flexion WNL  Extension WNL  Right lateral flexion    Left lateral flexion    Right rotation    Left rotation     (Blank rows = not tested)   LOWER EXTREMITY ROM:      WFL   LOWER EXTREMITY MMT:     Generally 4 to 4+/5   LUMBAR SPECIAL TESTS:  Straight leg raise test: Negative   FUNCTIONAL TESTS:  5 times sit to stand: 13.2s no UE support Timed up and go (TUG): 11.43s no AD   GAIT: Distance walked: 50 Assistive device utilized: None Level of assistance: Complete Independence Comments:  short step length   TODAY'S TREATMENT:    01/30/22: Nustep x 5 min level 5  Sit to stand from mat table x 10 Seated LAQ 2 x 10 with 5 lb ankle weight Seated march x 20 with 5 lb ankle weight Seated hip ER 2 x 10 with 5 lb ankle weight Supine SLR 2 x 10 Supine hip abduction 2 x 10 PPT x 20 PPT with 90/90 heel taps x20 PPT with alternating arms and legs x 20 Supine clam with yellow tband x 20 Sidelying clam with yellow tband  2 x 10 Trunk rotation x 20  01/28/22: Nustep x 5 min level 5  Hamstring stretching supine 3 x 30 sec both IT band/lateral hip stretch 3 x 20 sec both Hook lying trunk rotation x 20 Supine Single Knee to chest 3x 10 sec Bil Supine double knee to chest 3 x 10 sec TA with blue loop clamshell 2x10:   Sidelying clam with blue loop 2 x 10 both Hooklying PPT with heel alternating heel taps Educated patient on importance of flexibility along with core strength   01/24/22:Supine in hooklying wiith MHP to back: TA contraction with Pilates breath: 2x5, pt provided her own TC with her hands on TA. No assist from PTA today. TA with red band clamshell 2x10: gave pt band for home.  TA with tiny, slow steps 2x5 each side: TC at TA Isometric hip flexion Bil 3 sec hold 5x Bil Supine Single Knee to chest 3x 10 sec Bil Nutsep 6 min L2  with PTA present Supine bridge: 3 sec hold 2x5 Sit  to stand 10x no UE  01/20/22: Supine in hooklying wiith MHP to back: TA contraction with Pilates breath: 2x5, first set PTA hands on lower abs, second set had pt put her hands on her lower abs to feel contraction. TA with sinlge leg clamshell 2x5 bil with PTA cuing TA with hands TA with tiny, slow steps 2x5 each side: TC at TA Isometric hip flexion Bil 3 sec hold 3x Bil Supine Single Knee to chest 3x 10 sec Bil Nutsep 5 min L2  with PTA present    PATIENT EDUCATION:  Education details: Added new core exercises to HEP Person educated: Patient Education method: Explanation, Demonstration, Verbal cues, and Handouts Education  comprehension: verbalized understanding, returned demonstration, and verbal cues required     HOME EXERCISE PROGRAM: Access Code: [email or web address] URL: [email or web address] Date: 01/14/2022 Prepared by: Candyce Churn  Exercises - Supine Hamstring Stretch with Strap  - 1 x daily - 7 x weekly - 1 sets - 3 reps - 30 sec hold - Quadricep Stretch with Chair and Counter Support  - 1 x daily - 7 x weekly - 1 sets - 3 reps - 30 sec hold - Supine  Posterior Pelvic Tilt  - 1 x daily - 7 x weekly - 3 sets - 10 reps - Supine 90/90 Alternating Heel Touches with Posterior Pelvic Tilt  - 1 x daily - 7 x weekly - 3 sets - 10 reps - Dead Bug  - 1 x daily - 7 x weekly - 3 sets - 10 reps - Supine Double Knee to Chest  - 1 x daily - 7 x weekly - 1 sets - 3 reps - 1 min hold - Standing Lumbar Spine Flexion Stretch Counter  - 1 x daily - 7 x weekly - 1 sets - 10 reps - 10 sec holdAccess Code: [email or web address] URL: [email or web address] Date: 12/19/2021 Prepared by: Candyce Churn  Exercises - Supine Hamstring Stretch with Strap  - 1 x daily - 7 x weekly - 1 sets - 3 reps - 30 sec hold - Quadricep Stretch with Chair and Counter Support  - 1 x daily - 7 x weekly - 1 sets - 3 reps - 30 sec hold - Supine Posterior Pelvic Tilt  - 1 x daily - 7 x weekly - 3 sets - 10 reps - Supine 90/90 Alternating Heel Touches with Posterior Pelvic Tilt  - 1 x daily - 7 x weekly - 3 sets - 10 reps - Dead Bug  - 1 x daily - 7 x weekly - 3 sets - 10 repsAccess Code: [email or web address] URL: [email or web address] Date: 12/17/2021 Prepared by: Candyce Churn   Exercises - Supine Hamstring Stretch with Strap  - 1 x daily - 7 x weekly - 1 sets - 3 reps - 30 sec hold - Quadricep Stretch with Chair and Counter Support  - 1 x daily - 7 x weekly - 1 sets - 3 reps - 30 sec hold  01/24/22: added supine bridge, isometric hip flexion and sit to stand to HEP: See Medbridge for details ASSESSMENT:   CLINICAL  IMPRESSION: Kennidee was able to do LE exercise without any cramping.  Her left LE is significantly weaker than her right.  She reported decreased pain following session today.  She should continue to improve.   Patient will benefit from continued skilled PT for Lumbar stabilization and LE strengthening to allow patient to continue to function independently and safely and to reduce fall risk.     OBJECTIVE IMPAIRMENTS decreased activity tolerance, decreased knowledge of condition, decreased mobility, difficulty walking, decreased ROM, decreased strength, hypomobility, increased fascial restrictions, increased muscle spasms, impaired flexibility, impaired sensation, improper body mechanics, postural dysfunction, and pain.    ACTIVITY LIMITATIONS carrying, lifting, bending, sitting, standing, squatting, sleeping, stairs, transfers, bed mobility, continence, bathing, toileting, dressing, and caring for others   PARTICIPATION LIMITATIONS: meal prep, cleaning, laundry, driving, shopping, community activity, yard work, and church   PERSONAL FACTORS Age, Fitness, and 1-2 comorbidities: CAD, HTN  are also affecting patient's functional outcome.    REHAB POTENTIAL: Good   CLINICAL DECISION MAKING: Evolving/moderate complexity   EVALUATION COMPLEXITY: Moderate     GOALS: Goals reviewed with patient? Yes   SHORT TERM GOALS: Target date: 01/14/2022   Patient will be independent with initial HEP  Baseline: Goal status: MET 01/30/22   2.  Pain report to be no greater than 4/10  Baseline:  Goal status: MET 01/30/22   3.  Radicular symptoms to centralize  Baseline:  Goal status: MET     LONG TERM GOALS: Target date: 02/11/2022   Patient to be independent with advanced HEP  Baseline:  Goal status: INITIAL  2.  Patient to report pain no greater than 2/10  Baseline:  Goal status: INITIAL   3.  Eliminate radicular symptoms Baseline:  Goal status: Met 01-28-22   4.  Patient to report 80%  improvement in overall symptoms Baseline:  Goal status: INITIAL   5.  Patient to be able to walk 1 mile without increased pain. Baseline:  Goal status: INITIAL   6.  Patient to be able to perform all household cleaning tasks and laundry without increased pain.  Baseline:  Goal status: INITIAL     PLAN: PT FREQUENCY: 2x/week   PT DURATION: 8 weeks   PLANNED INTERVENTIONS: Therapeutic exercises, Therapeutic activity, Neuromuscular re-education, Balance training, Gait training, Patient/Family education, Joint mobilization, Stair training, Aquatic Therapy, Dry Needling, Electrical stimulation, Spinal mobilization, Cryotherapy, Moist heat, Taping, Traction, Ultrasound, Ionotophoresis 59m/ml Dexamethasone, Manual therapy, and Re-evaluation.   PLAN FOR NEXT SESSION: review of new exercises given today, progress core strengthening and LE strengthening.     JAnderson MaltaB. Salvador Coupe, PT 01/30/22 9:30 AM   [address] [place] [address]Phone # [phone] [phone]

## 2022-02-04 ENCOUNTER — Ambulatory Visit: Payer: Medicare Other

## 2022-02-04 DIAGNOSIS — R252 Cramp and spasm: Secondary | ICD-10-CM

## 2022-02-04 DIAGNOSIS — M6281 Muscle weakness (generalized): Secondary | ICD-10-CM | POA: Diagnosis not present

## 2022-02-04 DIAGNOSIS — M5416 Radiculopathy, lumbar region: Secondary | ICD-10-CM

## 2022-02-04 DIAGNOSIS — R262 Difficulty in walking, not elsewhere classified: Secondary | ICD-10-CM

## 2022-02-04 NOTE — Therapy (Signed)
OUTPATIENT PHYSICAL THERAPY TREATMENT NOTE   Patient Name: Julie Petty MRN: [id number] DOB:07/12/1931, 88 y.o., female Today's Date: 02/04/2022  PCP: Spencer, Sara C, PA-C REFERRING PROVIDER: Brunson-Ollison, Joyce E, NP  END OF SESSION:   PT End of Session - 02/04/22 1003     Visit Number 10    Date for PT Re-Evaluation 02/11/22    Authorization Type [employer]    Progress Note Due on Visit 20    PT Start Time 0935    PT Stop Time 1015    PT Time Calculation (min) 40 min    Activity Tolerance Other (comment)   Patient needs lengthy education on options for her condition.  She limits her options for rehab due to 2 bad experiences (1 at silver sneakers class and 1 aquatics class experience) She does not understand why her legs still hurt.   Behavior During Therapy WFL for tasks assessed/performed                Past Medical History:  Diagnosis Date   Complication of anesthesia    Essential hypertension    GERD (gastroesophageal reflux disease)    Glaucoma    Hernia    Hyperlipemia    Myocardial infarction (HCC)    NSTEMI 04/24/18 (40% D2 by LHC; Dx: Takotsubo CM vs coronary spasm vs myocarditis)   PONV (postoperative nausea and vomiting)    Past Surgical History:  Procedure Laterality Date   CARDIAC CATHETERIZATION  04/05/03   CHOLECYSTECTOMY N/A 08/17/2018   Procedure: Laparoscopic Cholecystectomy;  Surgeon: Cornett, Thomas, MD;  Location: [facility];  Service: General;  Laterality: N/A;   KNEE ARTHROSCOPY  09/17/05   left   LEFT HEART CATH AND CORONARY ANGIOGRAPHY N/A 04/26/2018   Procedure: LEFT HEART CATH AND CORONARY ANGIOGRAPHY;  Surgeon: Harding, David W, MD;  Location: [facility];  Service: Cardiovascular;  Laterality: N/A;   VAGINAL HYSTERECTOMY  1980's   Patient Active Problem List   Diagnosis Date Noted   Radiculopathy, lumbar region 12/17/2021   Lung nodule 02/03/2020   Coronary artery disease involving native coronary artery  of native heart without angina pectoris 08/14/2018   Cardiomyopathy (HCC)    NSTEMI (non-ST elevated myocardial infarction) (HCC) 04/24/2018   Essential hypertension 04/24/2018   Hyperlipidemia with target LDL less than 70 04/24/2018   Inguinal hernia 01/14/2011    REFERRING DIAG: M54.16 (ICD-10-CM) - Radiculopathy, lumbar region M48.061 (ICD-10-CM) - Spinal stenosis, lumbar region without neurogenic claudication M47.816 (ICD-10-CM) - Spondylosis without myelopathy or radiculopathy, lumbar   THERAPY DIAG:  Muscle weakness (generalized)  Difficulty in walking, not elsewhere classified  Radiculopathy, lumbar region  Cramp and spasm  Rationale for Evaluation and Treatment Rehabilitation  PERTINENT HISTORY: see above  PRECAUTIONS: fall  Progress Note Reporting Period 12/17/21 to 02/04/22  See note below for Objective Data and Assessment of Progress/Goals.     SUBJECTIVE: "My back feels pretty good but my legs hurt and I just don't understand that"   PAIN:  Are you having pain? None right now   OBJECTIVE:    DIAGNOSTIC FINDINGS:  None found but scheduled for MRI   PATIENT SURVEYS:     SCREENING FOR RED FLAGS: Bowel or bladder incontinence: No Spinal tumors: No Cauda equina syndrome: No Compression fracture: No Abdominal aneurysm: No   COGNITION:           Overall cognitive status: Within functional limits for tasks assessed                            SENSATION: WFL   MUSCLE LENGTH: Hamstrings: Right 60 deg; Left 60 deg Thomas test: Right pos ; Left pos    POSTURE: rounded shoulders, forward head, and increased thoracic kyphosis     LUMBAR ROM:    Active  A/PROM  eval A/PROM  02/04/22  Flexion WNL WNL  Extension WNL WNL  Right lateral flexion   WNL  Left lateral flexion   WNL  Right rotation   WNL  Left rotation   WNL   (Blank rows = not tested)   LOWER EXTREMITY ROM:      WFL   LOWER EXTREMITY MMT:     Generally 4 to 4+/5   LUMBAR SPECIAL  TESTS:  Straight leg raise test: Negative   FUNCTIONAL TESTS:  5 times sit to stand: 13.2s no UE support (02/04/22  12.8 sec) Timed up and go (TUG): 11.43s no AD  (02/04/22 11.23 sec)   GAIT: Distance walked: 200 Assistive device utilized: None Level of assistance: Complete Independence Comments:  short step length  (02/04/22 improving step length)   TODAY'S TREATMENT:   02/04/22: PPT x 20 PPT with 90/90 heel taps x20 PPT with alternating arms and legs x 20  10th visit re-assessment: see below 15 min or more discussing patients options and her resignation to return to silver sneakers or aquatics due to "bad experiences".  Educated on spinal stenosis and posture and prognosis that the spacing issue will change enough to resolve her symptoms.  Educated on claudication vs. Venous insufficiency vs. Radiculopathy.  Explained to patient that her options may be limited due to her age and health history and that she may want to reconsider the aquatics program and how she would want to explain to the instructor about her past experiences so that she feels comfortable that they understand.  PPT with SLR  and yellow plyo ball to knee 2 x 10 each LE Trunk rotation x 20  01/30/22: Nustep x 5 min level 5  Sit to stand from mat table x 10 Seated LAQ 2 x 10 with 5 lb ankle weight Seated march x 20 with 5 lb ankle weight Seated hip ER 2 x 10 with 5 lb ankle weight Supine SLR 2 x 10 Supine hip abduction 2 x 10 PPT x 20 PPT with 90/90 heel taps x20 PPT with alternating arms and legs x 20 Supine clam with yellow tband x 20 Sidelying clam with yellow tband  2 x 10 Trunk rotation x 20  01/28/22: Nustep x 5 min level 5  Hamstring stretching supine 3 x 30 sec both IT band/lateral hip stretch 3 x 20 sec both Hook lying trunk rotation x 20 Supine Single Knee to chest 3x 10 sec Bil Supine double knee to chest 3 x 10 sec TA with blue loop clamshell 2x10:   Sidelying clam with blue loop 2 x 10  both Hooklying PPT with heel alternating heel taps Educated patient on importance of flexibility along with core strength      PATIENT EDUCATION:  Education details: Added new core exercises to HEP Person educated: Patient Education method: Explanation, Demonstration, Verbal cues, and Handouts Education comprehension: verbalized understanding, returned demonstration, and verbal cues required     HOME EXERCISE PROGRAM: Access Code: [email or web address] URL: [email or web address] Date: 01/14/2022 Prepared by: Candyce Churn  Exercises - Supine Hamstring Stretch with Strap  - 1 x daily - 7 x weekly - 1 sets - 3 reps - 30 sec hold - Tourist information centre manager with Chair and Counter Support  -  1 x daily - 7 x weekly - 1 sets - 3 reps - 30 sec hold - Supine Posterior Pelvic Tilt  - 1 x daily - 7 x weekly - 3 sets - 10 reps - Supine 90/90 Alternating Heel Touches with Posterior Pelvic Tilt  - 1 x daily - 7 x weekly - 3 sets - 10 reps - Dead Bug  - 1 x daily - 7 x weekly - 3 sets - 10 reps - Supine Double Knee to Chest  - 1 x daily - 7 x weekly - 1 sets - 3 reps - 1 min hold - Standing Lumbar Spine Flexion Stretch Counter  - 1 x daily - 7 x weekly - 1 sets - 10 reps - 10 sec holdAccess Code: [email or web address] URL: [email or web address] Date: 12/19/2021 Prepared by: Candyce Churn  Exercises - Supine Hamstring Stretch with Strap  - 1 x daily - 7 x weekly - 1 sets - 3 reps - 30 sec hold - Quadricep Stretch with Chair and Counter Support  - 1 x daily - 7 x weekly - 1 sets - 3 reps - 30 sec hold - Supine Posterior Pelvic Tilt  - 1 x daily - 7 x weekly - 3 sets - 10 reps - Supine 90/90 Alternating Heel Touches with Posterior Pelvic Tilt  - 1 x daily - 7 x weekly - 3 sets - 10 reps - Dead Bug  - 1 x daily - 7 x weekly - 3 sets - 10 repsAccess Code: [email or web address] URL: [email or web address] Date: 12/17/2021 Prepared by: Candyce Churn   Exercises - Supine Hamstring Stretch with  Strap  - 1 x daily - 7 x weekly - 1 sets - 3 reps - 30 sec hold - Quadricep Stretch with Chair and Counter Support  - 1 x daily - 7 x weekly - 1 sets - 3 reps - 30 sec hold  01/24/22: added supine bridge, isometric hip flexion and sit to stand to HEP: See Medbridge for details ASSESSMENT:   CLINICAL IMPRESSION: Audr[place]na was able to complete all exercises today with ease.  Her leg pain could be claudication but symptoms are not dependent on activity.  She is taking a statin but this was changed in the past year due to the leg pain and she has resolution of the leg pain that she was having with the Lipitor.  She is unsure that this is still coming from the statin and questions this due to the fact that it is only her lower legs bilaterally.  She does have some venous issues but states this was always in the right LE vs. Left.  She has f/u appt with MD this week.  Patient will benefit from continued skilled PT for Lumbar stabilization and LE strengthening to allow patient to continue to function independently and safely and to reduce fall risk.     OBJECTIVE IMPAIRMENTS decreased activity tolerance, decreased knowledge of condition, decreased mobility, difficulty walking, decreased ROM, decreased strength, hypomobility, increased fascial restrictions, increased muscle spasms, impaired flexibility, impaired sensation, improper body mechanics, postural dysfunction, and pain.    ACTIVITY LIMITATIONS carrying, lifting, bending, sitting, standing, squatting, sleeping, stairs, transfers, bed mobility, continence, bathing, toileting, dressing, and caring for others   PARTICIPATION LIMITATIONS: meal prep, cleaning, laundry, driving, shopping, community activity, yard work, and church   PERSONAL FACTORS Age, Fitness, and 1-2 comorbidities: CAD, HTN  are also affecting patient's functional outcome.    REHAB POTENTIAL: Good   CLINICAL DECISION MAKING: Evolving/moderate complexity  EVALUATION COMPLEXITY:  Moderate     GOALS: Goals reviewed with patient? Yes   SHORT TERM GOALS: Target date: 01/14/2022   Patient will be independent with initial HEP  Baseline: Goal status: MET 01/30/22   2.  Pain report to be no greater than 4/10  Baseline:  Goal status: MET 01/30/22   3.  Radicular symptoms to centralize  Baseline:  Goal status: MET     LONG TERM GOALS: Target date: 02/11/2022   Patient to be independent with advanced HEP  Baseline:  Goal status: INITIAL   2.  Patient to report pain no greater than 2/10  Baseline:  Goal status: INITIAL   3.  Eliminate radicular symptoms Baseline:  Goal status: Met 01-28-22   4.  Patient to report 80% improvement in overall symptoms Baseline:  Goal status: INITIAL   5.  Patient to be able to walk 1 mile without increased pain. Baseline:  Goal status: INITIAL   6.  Patient to be able to perform all household cleaning tasks and laundry without increased pain.  Baseline:  Goal status: INITIAL     PLAN: PT FREQUENCY: 2x/week   PT DURATION: 8 weeks   PLANNED INTERVENTIONS: Therapeutic exercises, Therapeutic activity, Neuromuscular re-education, Balance training, Gait training, Patient/Family education, Joint mobilization, Stair training, Aquatic Therapy, Dry Needling, Electrical stimulation, Spinal mobilization, Cryotherapy, Moist heat, Taping, Traction, Ultrasound, Ionotophoresis 60m/ml Dexamethasone, Manual therapy, and Re-evaluation.   PLAN FOR NEXT SESSION: Proceed based on MD recommendations.  Review of new exercises given today, progress core strengthening and LE strengthening.     JAnderson MaltaB. Celina Shiley, PT 02/04/22 1:05 PM   [facility] [address] [place] [place] [address]Phone # [phone] [phone]

## 2022-02-06 ENCOUNTER — Ambulatory Visit: Payer: Medicare Other

## 2022-02-11 ENCOUNTER — Ambulatory Visit: Payer: Medicare Other

## 2022-02-11 ENCOUNTER — Telehealth: Payer: Self-pay

## 2022-02-11 DIAGNOSIS — M6281 Muscle weakness (generalized): Secondary | ICD-10-CM | POA: Diagnosis not present

## 2022-02-11 DIAGNOSIS — R262 Difficulty in walking, not elsewhere classified: Secondary | ICD-10-CM

## 2022-02-11 DIAGNOSIS — R252 Cramp and spasm: Secondary | ICD-10-CM

## 2022-02-11 DIAGNOSIS — M5416 Radiculopathy, lumbar region: Secondary | ICD-10-CM

## 2022-02-11 NOTE — Telephone Encounter (Signed)
Call placed to provider to inquire about possible vascular causes for patients leg symptoms.  Patient has obvious lumbar degenerative changes but could some of her leg pain be associated with other issues.  Left message with provider on voicemail.

## 2022-02-11 NOTE — Therapy (Signed)
OUTPATIENT PHYSICAL THERAPY DISCHARGE NOTE   Patient Name: Julie Petty MRN: [id number] DOB:01/21/1932, 88 y.o., female Today's Date: 02/11/2022  PCP: Aura Dials, PA-C REFERRING PROVIDER: Burnard Hawthorne, NP  END OF SESSION:   PT End of Session - 02/11/22 1022     Visit Number 11    Date for PT Re-Evaluation 02/11/22    Authorization Type [employer]ICARE    Progress Note Due on Visit 20    PT Start Time 1015    PT Stop Time 1100    PT Time Calculation (min) 45 min    Activity Tolerance Patient tolerated treatment well    Behavior During Therapy WFL for tasks assessed/performed                Past Medical History:  Diagnosis Date   Complication of anesthesia    Essential hypertension    GERD (gastroesophageal reflux disease)    Glaucoma    Hernia    Hyperlipemia    Myocardial infarction ([place])    NSTEMI 04/24/18 (40% D2 by LHC; Dx: Takotsubo CM vs coronary spasm vs myocarditis)   PONV (postoperative nausea and vomiting)    Past Surgical History:  Procedure Laterality Date   CARDIAC CATHETERIZATION  04/05/03   CHOLECYSTECTOMY N/A 08/17/2018   Procedure: Laparoscopic Cholecystectomy;  Surgeon: Erroll Luna, MD;  Location: [facility];  Service: General;  Laterality: N/A;   KNEE ARTHROSCOPY  09/17/05   left   LEFT HEART CATH AND CORONARY ANGIOGRAPHY N/A 04/26/2018   Procedure: LEFT HEART CATH AND CORONARY ANGIOGRAPHY;  Surgeon: Leonie Man, MD;  Location: [facility];  Service: Cardiovascular;  Laterality: N/A;   VAGINAL HYSTERECTOMY  1980's   Patient Active Problem List   Diagnosis Date Noted   Radiculopathy, lumbar region 12/17/2021   Lung nodule 02/03/2020   Coronary artery disease involving native coronary artery of native heart without angina pectoris 08/14/2018   Cardiomyopathy ([place])    NSTEMI (non-ST elevated myocardial infarction) ([place]) 04/24/2018   Essential hypertension 04/24/2018   Hyperlipidemia with target LDL  less than 70 04/24/2018   Inguinal hernia 01/14/2011    REFERRING DIAG: M54.16 (ICD-10-CM) - Radiculopathy, lumbar region M48.061 (ICD-10-CM) - Spinal stenosis, lumbar region without neurogenic claudication M47.816 (ICD-10-CM) - Spondylosis without myelopathy or radiculopathy, lumbar   THERAPY DIAG:  Muscle weakness (generalized)  Difficulty in walking, not elsewhere classified  Radiculopathy, lumbar region  Cramp and spasm  Rationale for Evaluation and Treatment Rehabilitation  PERTINENT HISTORY: see above  PRECAUTIONS: fall  SUBJECTIVE: Patient states she is doing "pretty good".  She is scheduled for injections in October.  She feels she would like to continue her HEP independently.     PAIN:  Are you having pain? None right now   OBJECTIVE:    DIAGNOSTIC FINDINGS:  None found but scheduled for MRI   PATIENT SURVEYS:     SCREENING FOR RED FLAGS: Bowel or bladder incontinence: No Spinal tumors: No Cauda equina syndrome: No Compression fracture: No Abdominal aneurysm: No   COGNITION:           Overall cognitive status: Within functional limits for tasks assessed                          SENSATION: WFL   MUSCLE LENGTH: Hamstrings: Right 60 deg; Left 60 deg Thomas test: Right pos ; Left pos    POSTURE: rounded shoulders, forward head, and increased thoracic kyphosis  LUMBAR ROM:    Active  A/PROM  eval A/PROM  02/04/22 A/PROM  02/11/22  Flexion WNL WNL WNL  Extension WNL WNL WNL  Right lateral flexion   WNL WNL  Left lateral flexion   WNL WNL  Right rotation   WNL WNL  Left rotation   WNL WNL   (Blank rows = not tested)   LOWER EXTREMITY ROM:      WFL   LOWER EXTREMITY MMT:     Generally 4 to 4+/5   LUMBAR SPECIAL TESTS:  Straight leg raise test: Negative   FUNCTIONAL TESTS:  5 times sit to stand: 13.2s no UE support (02/04/22  12.8 sec) Timed up and go (TUG): 11.43s no AD  (02/04/22 11.23 sec)   GAIT: Distance walked: 200 Assistive  device utilized: None Level of assistance: Complete Independence Comments:  short step length  (02/04/22 improving step length)   TODAY'S TREATMENT:  02/11/22: DC assessment.  Entire session spent on educating patient on various possibilities for her leg pain.  Used visual resources to educate patient on the anatomy of the lumbar spine.  Explained s/s of claudication vs. Venous or arterial issues of the lower extremities.  Explained that her lumbar spine issues would easily point to that as the cause of her leg pain but that she could also have circulatory issues that may cause similar symptoms.  We discussed how her statin drug could contribute as well.  Gave patient DC plan to continue her HEP and suggested walking schedule.    02/04/22: PPT x 20 PPT with 90/90 heel taps x20 PPT with alternating arms and legs x 20  10th visit re-assessment: see below 15 min or more discussing patients options and her resignation to return to silver sneakers or aquatics due to "bad experiences".  Educated on spinal stenosis and posture and prognosis that the spacing issue will change enough to resolve her symptoms.  Educated on claudication vs. Venous insufficiency vs. Radiculopathy.  Explained to patient that her options may be limited due to her age and health history and that she may want to reconsider the aquatics program and how she would want to explain to the instructor about her past experiences so that she feels comfortable that they understand.  PPT with SLR  and yellow plyo ball to knee 2 x 10 each LE Trunk rotation x 20  01/30/22: Nustep x 5 min level 5  Sit to stand from mat table x 10 Seated LAQ 2 x 10 with 5 lb ankle weight Seated march x 20 with 5 lb ankle weight Seated hip ER 2 x 10 with 5 lb ankle weight Supine SLR 2 x 10 Supine hip abduction 2 x 10 PPT x 20 PPT with 90/90 heel taps x20 PPT with alternating arms and legs x 20 Supine clam with yellow tband x 20 Sidelying clam with yellow  tband  2 x 10 Trunk rotation x 20    PATIENT EDUCATION:  Education details: Added new core exercises to HEP Person educated: Patient Education method: Explanation, Demonstration, Verbal cues, and Handouts Education comprehension: verbalized understanding, returned demonstration, and verbal cues required     HOME EXERCISE PROGRAM: Access Code: [email or web address] URL: [email or web address] Date: 01/14/2022 Prepared by: Candyce Churn  Exercises - Supine Hamstring Stretch with Strap  - 1 x daily - 7 x weekly - 1 sets - 3 reps - 30 sec hold - Quadricep Stretch with Chair and Counter Support  - 1 x daily - 7 x weekly -  1 sets - 3 reps - 30 sec hold - Supine Posterior Pelvic Tilt  - 1 x daily - 7 x weekly - 3 sets - 10 reps - Supine 90/90 Alternating Heel Touches with Posterior Pelvic Tilt  - 1 x daily - 7 x weekly - 3 sets - 10 reps - Dead Bug  - 1 x daily - 7 x weekly - 3 sets - 10 reps - Supine Double Knee to Chest  - 1 x daily - 7 x weekly - 1 sets - 3 reps - 1 min hold - Standing Lumbar Spine Flexion Stretch Counter  - 1 x daily - 7 x weekly - 1 sets - 10 reps - 10 sec holdAccess Code: [email or web address] URL: [email or web address] Date: 12/19/2021 Prepared by: Candyce Churn  Exercises - Supine Hamstring Stretch with Strap  - 1 x daily - 7 x weekly - 1 sets - 3 reps - 30 sec hold - Quadricep Stretch with Chair and Counter Support  - 1 x daily - 7 x weekly - 1 sets - 3 reps - 30 sec hold - Supine Posterior Pelvic Tilt  - 1 x daily - 7 x weekly - 3 sets - 10 reps - Supine 90/90 Alternating Heel Touches with Posterior Pelvic Tilt  - 1 x daily - 7 x weekly - 3 sets - 10 reps - Dead Bug  - 1 x daily - 7 x weekly - 3 sets - 10 repsAccess Code: [email or web address] URL: [email or web address] Date: 12/17/2021 Prepared by: Candyce Churn   Exercises - Supine Hamstring Stretch with Strap  - 1 x daily - 7 x weekly - 1 sets - 3 reps - 30 sec hold - Quadricep Stretch with  Chair and Counter Support  - 1 x daily - 7 x weekly - 1 sets - 3 reps - 30 sec hold  01/24/22: added supine bridge, isometric hip flexion and sit to stand to HEP: See Medbridge for details ASSESSMENT:   CLINICAL IMPRESSION: Lauryn has met a plateau and wishes to continue her HEP independently.  She continues to have leg pain that is worse in the morning but admits she is able to do more during the day and she feels more stable when going out or being outdoors.  She is independent and compliant with her HEP.  She is averse to returning to silver sneakers as she suffered a fall there and she does not want to return to water aerobics due to going under water one time, therefore her guided exercise options are limited.  She was instructed on several activities that she could do independently at the gym but she states she will likely just do her HEP and walk at home.  Due to her leg symptoms and her explaining that when she wakes with severe leg pain in the morning, she does ankle pumps and this completely resolves her pain, PT called PCP to inquire as to whether a LE vascular consult would be appropriate.   Patient is to call and follow up with PCP regarding this.  We will DC at this time per patient request and max potential reached.    OBJECTIVE IMPAIRMENTS decreased activity tolerance, decreased knowledge of condition, decreased mobility, difficulty walking, decreased ROM, decreased strength, hypomobility, increased fascial restrictions, increased muscle spasms, impaired flexibility, impaired sensation, improper body mechanics, postural dysfunction, and pain.    ACTIVITY LIMITATIONS carrying, lifting, bending, sitting, standing, squatting, sleeping, stairs, transfers, bed mobility, continence, bathing, toileting, dressing, and caring for others   PARTICIPATION  LIMITATIONS: meal prep, cleaning, laundry, driving, shopping, community activity, yard work, and church   PERSONAL FACTORS Age, Fitness, and 1-2  comorbidities: CAD, HTN  are also affecting patient's functional outcome.    REHAB POTENTIAL: Good   CLINICAL DECISION MAKING: Evolving/moderate complexity   EVALUATION COMPLEXITY: Moderate     GOALS: Goals reviewed with patient? Yes   SHORT TERM GOALS: Target date: 01/14/2022   Patient will be independent with initial HEP  Baseline: Goal status: MET 01/30/22   2.  Pain report to be no greater than 4/10  Baseline:  Goal status: MET 01/30/22   3.  Radicular symptoms to centralize  Baseline:  Goal status: MET     LONG TERM GOALS: Target date: 02/11/2022   Patient to be independent with advanced HEP  Baseline:  Goal status: MET   2.  Patient to report pain no greater than 2/10  Baseline:  Goal status: MET   3.  Eliminate radicular symptoms Baseline:  Goal status: Met 01-28-22   4.  Patient to report 80% improvement in overall symptoms Baseline:  Goal status: Partially met   5.  Patient to be able to walk 1 mile without increased pain. Baseline:  Goal status: Partially met   6.  Patient to be able to perform all household cleaning tasks and laundry without increased pain.  Baseline:  Goal status: Partially met     PLAN: PT FREQUENCY: 2x/week   PT DURATION: 8 weeks   PLANNED INTERVENTIONS: Therapeutic exercises, Therapeutic activity, Neuromuscular re-education, Balance training, Gait training, Patient/Family education, Joint mobilization, Stair training, Aquatic Therapy, Dry Needling, Electrical stimulation, Spinal mobilization, Cryotherapy, Moist heat, Taping, Traction, Ultrasound, Ionotophoresis 79m/ml Dexamethasone, Manual therapy, and Re-evaluation.   PLAN FOR NEXT SESSION: We will DC at this time.    PHYSICAL THERAPY DISCHARGE SUMMARY  Visits from Start of Care: 11  Current functional level related to goals / functional outcomes: See above   Remaining deficits: See above   Education / Equipment: See above   Patient agrees to discharge. Patient  goals were partially met. Patient is being discharged due to being pleased with the current functional level.     JAnderson MaltaB. Jeromy Borcherding, PT 02/11/22 9:54 PM   BNanafaliaBCharleston [place]entonGThe [place] [address]Phone # [phone] [phone]

## 2022-04-18 ENCOUNTER — Other Ambulatory Visit: Payer: Self-pay | Admitting: Urology

## 2023-01-14 ENCOUNTER — Ambulatory Visit: Payer: Medicare Other | Admitting: Cardiovascular Disease

## 2023-02-04 ENCOUNTER — Other Ambulatory Visit: Payer: Self-pay | Admitting: Urology

## 2023-03-05 ENCOUNTER — Ambulatory Visit: Payer: Medicare Other | Admitting: Physical Therapy

## 2023-03-09 ENCOUNTER — Ambulatory Visit: Payer: Medicare Other | Admitting: Physical Therapy

## 2023-07-31 ENCOUNTER — Emergency Department (HOSPITAL_COMMUNITY): Payer: Medicare Other

## 2023-07-31 ENCOUNTER — Inpatient Hospital Stay (HOSPITAL_COMMUNITY)
Admission: EM | Admit: 2023-07-31 | Discharge: 2023-08-04 | DRG: 183 | Disposition: A | Discharge status: Skilled Nursing Facility | Payer: Medicare Other | Source: Skilled Nursing Facility | Attending: Internal Medicine | Admitting: Internal Medicine

## 2023-07-31 ENCOUNTER — Encounter (HOSPITAL_COMMUNITY): Payer: Self-pay | Admitting: Emergency Medicine

## 2023-07-31 ENCOUNTER — Other Ambulatory Visit: Payer: Self-pay

## 2023-07-31 DIAGNOSIS — E7800 Pure hypercholesterolemia, unspecified: Secondary | ICD-10-CM | POA: Diagnosis present

## 2023-07-31 DIAGNOSIS — Z66 Do not resuscitate: Secondary | ICD-10-CM | POA: Diagnosis present

## 2023-07-31 DIAGNOSIS — S0003XA Contusion of scalp, initial encounter: Secondary | ICD-10-CM | POA: Diagnosis present

## 2023-07-31 DIAGNOSIS — S2242XD Multiple fractures of ribs, left side, subsequent encounter for fracture with routine healing: Secondary | ICD-10-CM | POA: Diagnosis not present

## 2023-07-31 DIAGNOSIS — I4510 Unspecified right bundle-branch block: Secondary | ICD-10-CM | POA: Diagnosis present

## 2023-07-31 DIAGNOSIS — R54 Age-related physical debility: Secondary | ICD-10-CM | POA: Diagnosis present

## 2023-07-31 DIAGNOSIS — Z90710 Acquired absence of both cervix and uterus: Secondary | ICD-10-CM

## 2023-07-31 DIAGNOSIS — I10 Essential (primary) hypertension: Secondary | ICD-10-CM | POA: Diagnosis present

## 2023-07-31 DIAGNOSIS — I480 Paroxysmal atrial fibrillation: Secondary | ICD-10-CM | POA: Diagnosis present

## 2023-07-31 DIAGNOSIS — E876 Hypokalemia: Secondary | ICD-10-CM | POA: Diagnosis present

## 2023-07-31 DIAGNOSIS — Z8249 Family history of ischemic heart disease and other diseases of the circulatory system: Secondary | ICD-10-CM | POA: Diagnosis not present

## 2023-07-31 DIAGNOSIS — K219 Gastro-esophageal reflux disease without esophagitis: Secondary | ICD-10-CM | POA: Diagnosis present

## 2023-07-31 DIAGNOSIS — Z7901 Long term (current) use of anticoagulants: Secondary | ICD-10-CM | POA: Diagnosis not present

## 2023-07-31 DIAGNOSIS — J101 Influenza due to other identified influenza virus with other respiratory manifestations: Secondary | ICD-10-CM

## 2023-07-31 DIAGNOSIS — I2510 Atherosclerotic heart disease of native coronary artery without angina pectoris: Secondary | ICD-10-CM | POA: Diagnosis present

## 2023-07-31 DIAGNOSIS — H409 Unspecified glaucoma: Secondary | ICD-10-CM | POA: Diagnosis present

## 2023-07-31 DIAGNOSIS — J189 Pneumonia, unspecified organism: Secondary | ICD-10-CM | POA: Diagnosis not present

## 2023-07-31 DIAGNOSIS — I4892 Unspecified atrial flutter: Secondary | ICD-10-CM | POA: Diagnosis present

## 2023-07-31 DIAGNOSIS — I483 Typical atrial flutter: Secondary | ICD-10-CM | POA: Diagnosis not present

## 2023-07-31 DIAGNOSIS — Z888 Allergy status to other drugs, medicaments and biological substances status: Secondary | ICD-10-CM

## 2023-07-31 DIAGNOSIS — E860 Dehydration: Secondary | ICD-10-CM | POA: Diagnosis present

## 2023-07-31 DIAGNOSIS — Y92009 Unspecified place in unspecified non-institutional (private) residence as the place of occurrence of the external cause: Secondary | ICD-10-CM | POA: Diagnosis not present

## 2023-07-31 DIAGNOSIS — I252 Old myocardial infarction: Secondary | ICD-10-CM | POA: Diagnosis not present

## 2023-07-31 DIAGNOSIS — S2242XA Multiple fractures of ribs, left side, initial encounter for closed fracture: Secondary | ICD-10-CM | POA: Diagnosis present

## 2023-07-31 DIAGNOSIS — S270XXS Traumatic pneumothorax, sequela: Secondary | ICD-10-CM

## 2023-07-31 DIAGNOSIS — Z87440 Personal history of urinary (tract) infections: Secondary | ICD-10-CM

## 2023-07-31 DIAGNOSIS — J1000 Influenza due to other identified influenza virus with unspecified type of pneumonia: Principal | ICD-10-CM | POA: Diagnosis present

## 2023-07-31 DIAGNOSIS — R55 Syncope and collapse: Secondary | ICD-10-CM | POA: Diagnosis not present

## 2023-07-31 DIAGNOSIS — R32 Unspecified urinary incontinence: Secondary | ICD-10-CM | POA: Diagnosis present

## 2023-07-31 DIAGNOSIS — W19XXXS Unspecified fall, sequela: Secondary | ICD-10-CM | POA: Diagnosis not present

## 2023-07-31 DIAGNOSIS — Z79899 Other long term (current) drug therapy: Secondary | ICD-10-CM

## 2023-07-31 DIAGNOSIS — Z882 Allergy status to sulfonamides status: Secondary | ICD-10-CM

## 2023-07-31 DIAGNOSIS — M5416 Radiculopathy, lumbar region: Secondary | ICD-10-CM | POA: Diagnosis present

## 2023-07-31 DIAGNOSIS — Z1152 Encounter for screening for COVID-19: Secondary | ICD-10-CM

## 2023-07-31 DIAGNOSIS — S270XXA Traumatic pneumothorax, initial encounter: Secondary | ICD-10-CM | POA: Diagnosis present

## 2023-07-31 DIAGNOSIS — W19XXXA Unspecified fall, initial encounter: Secondary | ICD-10-CM | POA: Diagnosis present

## 2023-07-31 DIAGNOSIS — Z885 Allergy status to narcotic agent status: Secondary | ICD-10-CM

## 2023-07-31 DIAGNOSIS — R001 Bradycardia, unspecified: Secondary | ICD-10-CM | POA: Diagnosis present

## 2023-07-31 LAB — CBC
HCT: 46 % (ref 36.0–46.0)
Hemoglobin: 15 g/dL (ref 12.0–15.0)
MCH: 33.1 pg (ref 26.0–34.0)
MCHC: 32.6 g/dL (ref 30.0–36.0)
MCV: 101.5 fL — ABNORMAL HIGH (ref 80.0–100.0)
Platelets: 188 10*3/uL (ref 150–400)
RBC: 4.53 MIL/uL (ref 3.87–5.11)
RDW: 12.6 % (ref 11.5–15.5)
WBC: 9.4 10*3/uL (ref 4.0–10.5)
nRBC: 0 % (ref 0.0–0.2)

## 2023-07-31 LAB — BASIC METABOLIC PANEL
Anion gap: 10 (ref 5–15)
BUN: 27 mg/dL — ABNORMAL HIGH (ref 8–23)
CO2: 26 mmol/L (ref 22–32)
Calcium: 9.2 mg/dL (ref 8.9–10.3)
Chloride: 101 mmol/L (ref 98–111)
Creatinine, Ser: 1.05 mg/dL — ABNORMAL HIGH (ref 0.44–1.00)
GFR, Estimated: 51 mL/min — ABNORMAL LOW (ref >60)
Glucose, Bld: 129 mg/dL — ABNORMAL HIGH (ref 70–99)
Potassium: 3.6 mmol/L (ref 3.5–5.1)
Sodium: 137 mmol/L (ref 135–145)

## 2023-07-31 LAB — RESP PANEL BY RT-PCR (RSV, FLU A&B, COVID)  RVPGX2
Influenza A by PCR: POSITIVE — AB
Influenza B by PCR: NEGATIVE
Resp Syncytial Virus by PCR: NEGATIVE
SARS Coronavirus 2 by RT PCR: NEGATIVE

## 2023-07-31 LAB — MAGNESIUM: Magnesium: 2.1 mg/dL (ref 1.7–2.4)

## 2023-07-31 LAB — TROPONIN I (HIGH SENSITIVITY): Troponin I (High Sensitivity): 6 ng/L (ref <18)

## 2023-07-31 MED ORDER — ACETAMINOPHEN 500 MG PO TABS
1000.0000 mg | ORAL_TABLET | Freq: Three times a day (TID) | ORAL | Status: DC
  Administered 2023-07-31 – 2023-08-04 (×11): 1000 mg via ORAL
  Filled 2023-07-31 (×11): qty 2

## 2023-07-31 MED ORDER — LIDOCAINE 5 % EX PTCH
1.0000 | MEDICATED_PATCH | Freq: Every day | CUTANEOUS | Status: DC
  Administered 2023-07-31 – 2023-08-03 (×4): 1 via TRANSDERMAL
  Filled 2023-07-31 (×5): qty 1

## 2023-07-31 MED ORDER — ONDANSETRON HCL 4 MG PO TABS: 4.0000 mg | ORAL_TABLET | Freq: Four times a day (QID) | ORAL | Status: DC | PRN

## 2023-07-31 MED ORDER — ATORVASTATIN CALCIUM 10 MG PO TABS
10.0000 mg | ORAL_TABLET | Freq: Every day | ORAL | Status: DC
  Administered 2023-07-31 – 2023-08-03 (×4): 10 mg via ORAL
  Filled 2023-07-31 (×4): qty 1

## 2023-07-31 MED ORDER — DOXYCYCLINE HYCLATE 100 MG PO TABS
100.0000 mg | ORAL_TABLET | Freq: Two times a day (BID) | ORAL | Status: DC
  Administered 2023-08-01: 100 mg via ORAL
  Filled 2023-07-31: qty 1

## 2023-07-31 MED ORDER — SODIUM CHLORIDE 0.9 % IV SOLN: 1.0000 g | INTRAVENOUS | Status: DC

## 2023-07-31 MED ORDER — DOXYCYCLINE HYCLATE 100 MG PO TABS
100.0000 mg | ORAL_TABLET | Freq: Once | ORAL | Status: AC
  Administered 2023-07-31: 100 mg via ORAL
  Filled 2023-07-31: qty 1

## 2023-07-31 MED ORDER — PANTOPRAZOLE SODIUM 40 MG PO TBEC
40.0000 mg | DELAYED_RELEASE_TABLET | Freq: Every day | ORAL | Status: DC
  Administered 2023-08-01 – 2023-08-04 (×4): 40 mg via ORAL
  Filled 2023-07-31 (×4): qty 1

## 2023-07-31 MED ORDER — ACETAMINOPHEN 325 MG PO TABS
650.0000 mg | ORAL_TABLET | Freq: Once | ORAL | Status: AC
  Administered 2023-07-31: 650 mg via ORAL
  Filled 2023-07-31: qty 2

## 2023-07-31 MED ORDER — EZETIMIBE 10 MG PO TABS
10.0000 mg | ORAL_TABLET | Freq: Every day | ORAL | Status: DC
  Administered 2023-08-01 – 2023-08-03 (×3): 10 mg via ORAL
  Filled 2023-07-31 (×4): qty 1

## 2023-07-31 MED ORDER — AMLODIPINE BESYLATE 2.5 MG PO TABS
2.5000 mg | ORAL_TABLET | Freq: Every day | ORAL | Status: DC
  Administered 2023-08-01 – 2023-08-03 (×3): 2.5 mg via ORAL
  Filled 2023-07-31 (×4): qty 1

## 2023-07-31 MED ORDER — KETOROLAC TROMETHAMINE 30 MG/ML IJ SOLN
30.0000 mg | Freq: Once | INTRAMUSCULAR | Status: AC
  Administered 2023-07-31: 30 mg via INTRAVENOUS
  Filled 2023-07-31: qty 1

## 2023-07-31 MED ORDER — SODIUM CHLORIDE 0.9 % IV SOLN: INTRAVENOUS | Status: AC

## 2023-07-31 MED ORDER — ENOXAPARIN SODIUM 40 MG/0.4ML IJ SOSY
40.0000 mg | PREFILLED_SYRINGE | INTRAMUSCULAR | Status: DC
  Administered 2023-07-31 – 2023-08-03 (×4): 40 mg via SUBCUTANEOUS
  Filled 2023-07-31 (×4): qty 0.4

## 2023-07-31 MED ORDER — SODIUM CHLORIDE 0.9 % IV SOLN
1.0000 g | Freq: Once | INTRAVENOUS | Status: AC
  Administered 2023-07-31: 1 g via INTRAVENOUS
  Filled 2023-07-31: qty 10

## 2023-07-31 MED ORDER — ACETAMINOPHEN 650 MG RE SUPP: 650.0000 mg | Freq: Four times a day (QID) | RECTAL | Status: DC | PRN

## 2023-07-31 MED ORDER — SENNOSIDES-DOCUSATE SODIUM 8.6-50 MG PO TABS: 1.0000 | ORAL_TABLET | Freq: Every evening | ORAL | Status: DC | PRN

## 2023-07-31 MED ORDER — HYDROMORPHONE HCL 1 MG/ML IJ SOLN: 0.5000 mg | Freq: Four times a day (QID) | INTRAMUSCULAR | Status: DC | PRN

## 2023-07-31 MED ORDER — GABAPENTIN 100 MG PO CAPS
100.0000 mg | ORAL_CAPSULE | Freq: Every day | ORAL | Status: DC
  Administered 2023-08-01 – 2023-08-03 (×3): 100 mg via ORAL
  Filled 2023-07-31 (×4): qty 1

## 2023-07-31 MED ORDER — ACETAMINOPHEN 325 MG PO TABS: 650.0000 mg | ORAL_TABLET | Freq: Four times a day (QID) | ORAL | Status: DC | PRN

## 2023-07-31 MED ORDER — ONDANSETRON HCL 4 MG/2ML IJ SOLN
4.0000 mg | Freq: Four times a day (QID) | INTRAMUSCULAR | Status: DC | PRN
  Administered 2023-08-01 – 2023-08-03 (×2): 4 mg via INTRAVENOUS
  Filled 2023-07-31 (×2): qty 2

## 2023-07-31 NOTE — ED Provider Notes (Signed)
 [place] [facility] AT [facility] Provider Note   CSN: [id number] Arrival date & time: 07/31/23  1426     History  Chief Complaint  Patient presents with   Fall   Emesis   Loss of Consciousness    Julie Petty is a 89 y.o. female presenting to the ED from her nursing facility with concern for weakness, fall and a head injury.  Patient reports that she has had viral type syndromes for about 3 to 4 days with general fatigue, runny nose, cough.  She says she had a rapid negative COVID and flu test performed at her friend's facility.  Today the patient got lightheaded and fell, striking the back of her head and her left ribs.  She is not on anticoagulation.  She is on chronic Keflex  for recurrent UTIs.  She does not have dysuria at this time.  Her children are present at the bedside for exam.  HPI     Home Medications Prior to Admission medications   Medication Sig Start Date End Date Taking? Authorizing Provider  amLODipine  (NORVASC ) 5 MG tablet Take 0.5 tablets (2.5 mg total) by mouth at bedtime. 05/07/18  Yes Meng, Hao, PA  atorvastatin  (LIPITOR) 10 MG tablet Take 10 mg by mouth at bedtime.   Yes [provider]  ezetimibe  (ZETIA ) 10 MG tablet Take 10 mg by mouth daily.   Yes [provider]  brinzolamide  (AZOPT ) 1 % ophthalmic suspension Place 1 drop into both eyes 2 (two) times daily.     [provider]  gabapentin  (NEURONTIN ) 100 MG capsule Take 100 mg by mouth daily.    [provider]  metoprolol  succinate (TOPROL -XL) 25 MG 24 hr tablet Take 0.5 tablets (12.5 mg total) by mouth daily. SCHEDULE OFFICE VISIT FOR FUTURE REFILLS 07/08/21   Meng, Hao, PA  nitroGLYCERIN  (NITROSTAT ) 0.4 MG SL tablet Place 1 tablet (0.4 mg total) under the tongue every 5 (five) minutes x 3 doses as needed for chest pain. 04/28/18   Henry Manuelita NOVAK, NP  omeprazole  (PRILOSEC) 40 MG capsule Take 40 mg by mouth daily.    [provider]  triamcinolone (KENALOG) 0.025 % ointment Apply 1 application topically every other day.  03/22/18   [provider]      Allergies    Dicyclomine, Morphine  sulfate, Mupirocin, Statins, Losartan  potassium, Morphine  and codeine, Sulfa antibiotics, Butalbital-asa-caff-codeine, Codeine, Elemental sulfur, and Losartan     Review of Systems   Review of Systems  Physical Exam Updated Vital Signs BP (!) 160/62 (BP Location: Left Arm)   Pulse (!) 57   Temp 97.8 F (36.6 C) (Oral)   Resp 19   Ht 5' 2.99 (1.6 m)   Wt 59.5 kg   SpO2 95%   BMI 23.24 kg/m  Physical Exam Constitutional:      General: She is not in acute distress. HENT:     Head: Normocephalic.     Comments: Small occipital hematoma Eyes:     Conjunctiva/sclera: Conjunctivae normal.     Pupils: Pupils are equal, round, and reactive to light.  Cardiovascular:     Rate and Rhythm: Normal rate and regular rhythm.  Pulmonary:     Effort: Pulmonary effort is normal. No respiratory distress.     Comments: 89-95% on room air Abdominal:     General: There is no distension.     Tenderness: There is no abdominal tenderness.  Musculoskeletal:     Comments: Left lateral rib tenderness  Skin:    General: Skin is warm and dry.  Neurological:     General: No focal deficit present.     Mental Status: She is alert. Mental status is at baseline.  Psychiatric:        Mood and Affect: Mood normal.        Behavior: Behavior normal.     ED Results / Procedures / Treatments   Labs (all labs ordered are listed, but only abnormal results are displayed) Labs Reviewed  RESP PANEL BY RT-PCR (RSV, FLU A&B, COVID)  RVPGX2 - Abnormal; Notable for the following components:      Result Value   Influenza A by PCR POSITIVE (*)    All other components within normal limits  BASIC METABOLIC PANEL - Abnormal; Notable for the following components:   Glucose, Bld 129 (*)    BUN 27 (*)    Creatinine, Ser 1.05 (*)    GFR, Estimated  51 (*)    All other components within normal limits  CBC - Abnormal; Notable for the following components:   MCV 101.5 (*)    All other components within normal limits  EXPECTORATED SPUTUM ASSESSMENT W GRAM STAIN, RFLX TO RESP C  MRSA NEXT GEN BY PCR, NASAL  MAGNESIUM   URINALYSIS, ROUTINE W REFLEX MICROSCOPIC  CBC  BASIC METABOLIC PANEL  VITAMIN D  25 HYDROXY (VIT D DEFICIENCY, FRACTURES)  TSH  PROCALCITONIN  TROPONIN I (HIGH SENSITIVITY)    EKG EKG Interpretation Date/Time:  Friday July 31 2023 16:30:27 EST Ventricular Rate:  59 PR Interval:  180 QRS Duration:  114 QT Interval:  442 QTC Calculation: 438 R Axis:   -61  Text Interpretation: Sinus rhythm Probable left atrial enlargement LAD, consider left anterior fascicular block Confirmed by Cottie Cough [phone]) on 07/31/2023 4:40:31 PM  Radiology CT Chest Wo Contrast Addendum Date: 07/31/2023 ADDENDUM REPORT: 07/31/2023 17:45 ADDENDUM: The original report was by Dr. Ryan Salvage. The following addendum is by Dr. Ryan Salvage: Critical Value/emergent results were called by telephone at the time of interpretation on 07/31/2023 at 5:34 pm to provider Trinaty Bundrick [facility] , who verbally acknowledged these results. Electronically Signed   By: Ryan Salvage M.D.   On: 07/31/2023 17:45   Result Date: 07/31/2023 CLINICAL DATA:  Chest trauma, clinically suspected left rib fractures. Fall, syncope. EXAM: CT CHEST WITHOUT CONTRAST TECHNIQUE: Multidetector CT imaging of the chest was performed following the standard protocol without IV contrast. RADIATION DOSE REDUCTION: This exam was performed according to the departmental dose-optimization program which includes automated exposure control, adjustment of the mA and/or kV according to patient size and/or use of iterative reconstruction technique. COMPARISON:  Chest radiograph 12/21/2019 and chest CT 07/12/2018 FINDINGS: Cardiovascular: Coronary, aortic arch, and branch vessel  atherosclerotic vascular disease. Mild cardiomegaly. Mediastinum/Nodes: Unremarkable Lungs/Pleura: Biapical pleuroparenchymal scarring. Airway thickening noted with cylindrical bronchiectasis in the right middle lobe and airway plugging in the right middle lobe and both lower lobes. Clustered reticulonodular opacities in the right middle lobe posteriorly are increased from prior but still favor atypical infectious bronchiolitis based on morphology. Dependent atelectasis noted in both lower lobes. Airway plugging noted in the left upper lobe. There is a 2% or less left pneumothorax noted best seen anterior to the left pericardium. Trace left hemothorax. Upper Abdomen: Abdominal aortic atherosclerosis, including substantial atheromatous plaque at the origins of the celiac trunk and superior mesenteric artery. Musculoskeletal: Acute segmental fractures of the left third, fourth, sixth, seventh, eighth, and tenth ribs noted. Additional acute fractures of  the left ninth and eleventh ribs noted. Thoracic kyphosis. IMPRESSION: 1. Acute segmental fractures of the left third, fourth, sixth, seventh, eighth, and tenth ribs. Additional acute fractures of the left ninth and eleventh ribs. 2. There is a 2% or less left pneumothorax noted best seen anterior to the left pericardium. Trace left hemothorax. 3. Airway thickening with cylindrical bronchiectasis in the right middle lobe and airway plugging in the right middle lobe and both lower lobes. Clustered reticulonodular opacities in the right middle lobe posteriorly are increased from prior but still favor atypical infectious bronchiolitis based on morphology. 4. Dependent atelectasis in both lower lobes. 5. Mild cardiomegaly. 6. Coronary, aortic arch, and branch vessel atherosclerotic vascular disease. 7.  Aortic Atherosclerosis (ICD10-I70.0). Radiology assistant personnel have been notified to put me in telephone contact with the referring physician or the referring  physician's clinical representative in order to discuss these findings. Once this communication is established I will issue an addendum to this report for documentation purposes. Electronically Signed: By: Ryan Salvage M.D. On: 07/31/2023 17:29   CT Head Wo Contrast Result Date: 07/31/2023 CLINICAL DATA:  Polytrauma, blunt EXAM: CT HEAD WITHOUT CONTRAST CT CERVICAL SPINE WITHOUT CONTRAST TECHNIQUE: Multidetector CT imaging of the head and cervical spine was performed following the standard protocol without intravenous contrast. Multiplanar CT image reconstructions of the cervical spine were also generated. RADIATION DOSE REDUCTION: This exam was performed according to the departmental dose-optimization program which includes automated exposure control, adjustment of the mA and/or kV according to patient size and/or use of iterative reconstruction technique. COMPARISON:  CT head 07/12/2018. FINDINGS: CT HEAD FINDINGS Brain: No evidence of acute infarction, hemorrhage, hydrocephalus, extra-axial collection or mass lesion/mass effect. Vascular: No hyperdense vessel. Skull: No acute fracture.  Small high left scalp contusion. Sinuses/Orbits: Clear sinuses.  No acute orbital findings. Other: No mastoid effusions. CT CERVICAL SPINE FINDINGS Alignment: Approximately 2 mm of anterolisthesis of C4 on C5, likely degenerative given severe facet arthropathy on the left at this level. Mild broad levocurvature. Skull base and vertebrae: Vertebral body heights are maintained. No evidence of acute fracture. Osteopenia. Soft tissues and spinal canal: No prevertebral fluid or swelling. No visible canal hematoma. Disc levels: Multilevel facet uncovertebral hypertrophy. Multilevel degenerative disc disease, greatest at C6-C7. Upper chest: Visualized lung apices are clear. IMPRESSION: 1. No evidence of acute abnormality intracranially or in the cervical spine. 2. Small high left scalp contusion. Electronically Signed   By:  Gilmore GORMAN Molt M.D.   On: 07/31/2023 16:37   CT Cervical Spine Wo Contrast Result Date: 07/31/2023 CLINICAL DATA:  Polytrauma, blunt EXAM: CT HEAD WITHOUT CONTRAST CT CERVICAL SPINE WITHOUT CONTRAST TECHNIQUE: Multidetector CT imaging of the head and cervical spine was performed following the standard protocol without intravenous contrast. Multiplanar CT image reconstructions of the cervical spine were also generated. RADIATION DOSE REDUCTION: This exam was performed according to the departmental dose-optimization program which includes automated exposure control, adjustment of the mA and/or kV according to patient size and/or use of iterative reconstruction technique. COMPARISON:  CT head 07/12/2018. FINDINGS: CT HEAD FINDINGS Brain: No evidence of acute infarction, hemorrhage, hydrocephalus, extra-axial collection or mass lesion/mass effect. Vascular: No hyperdense vessel. Skull: No acute fracture.  Small high left scalp contusion. Sinuses/Orbits: Clear sinuses.  No acute orbital findings. Other: No mastoid effusions. CT CERVICAL SPINE FINDINGS Alignment: Approximately 2 mm of anterolisthesis of C4 on C5, likely degenerative given severe facet arthropathy on the left at this level. Mild broad levocurvature. Skull base and vertebrae:  Vertebral body heights are maintained. No evidence of acute fracture. Osteopenia. Soft tissues and spinal canal: No prevertebral fluid or swelling. No visible canal hematoma. Disc levels: Multilevel facet uncovertebral hypertrophy. Multilevel degenerative disc disease, greatest at C6-C7. Upper chest: Visualized lung apices are clear. IMPRESSION: 1. No evidence of acute abnormality intracranially or in the cervical spine. 2. Small high left scalp contusion. Electronically Signed   By: Gilmore GORMAN Molt M.D.   On: 07/31/2023 16:37   CT T-SPINE NO CHARGE Result Date: 07/31/2023 CLINICAL DATA:  Fall, syncopal episode EXAM: CT THORACIC SPINE WITHOUT CONTRAST TECHNIQUE: Multidetector  CT images of the thoracic were obtained using the standard protocol without intravenous contrast. RADIATION DOSE REDUCTION: This exam was performed according to the departmental dose-optimization program which includes automated exposure control, adjustment of the mA and/or kV according to patient size and/or use of iterative reconstruction technique. COMPARISON:  07/27/2018 MRI thoracic spine, 07/12/2018 CT chest no prior dedicated CT of the thoracic spine FINDINGS: Alignment: Exaggeration of the normal thoracic kyphosis. Trace anterolisthesis of C7 on T1. No other significant listhesis. Vertebrae: No acute fracture or suspicious osseous lesion. Chronic mild wedging of T7, T8, and T9. 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 For additional findings in the thorax, including rib findings, please see same-day CT chest. Disc levels: No significant spinal canal stenosis or neural foraminal narrowing. IMPRESSION: No acute fracture or traumatic listhesis in the thoracic spine. Electronically Signed   By: Donald Campion M.D.   On: 07/31/2023 16:37    Procedures .Critical Care  Performed by: Cottie Donnice PARAS, MD Authorized by: Cottie Donnice PARAS, MD   Critical care provider statement:    Critical care time (minutes):  35   Critical care time was exclusive of:  Separately billable procedures and treating other patients   Critical care was necessary to treat or prevent imminent or life-threatening deterioration of the following conditions:  Trauma   Critical care was time spent personally by me on the following activities:  Ordering and performing treatments and interventions, ordering and review of laboratory studies, ordering and review of radiographic studies, pulse oximetry, review of old charts, examination of patient and evaluation of patient's  response to treatment   Care discussed with: admitting provider       Medications Ordered in ED Medications  enoxaparin  (LOVENOX ) injection 40 mg (40 mg Subcutaneous Given 07/31/23 2224)  senna-docusate (Senokot-S) tablet 1 tablet (has no administration in time range)  ondansetron  (ZOFRAN ) tablet 4 mg (has no administration in time range)    Or  ondansetron  (ZOFRAN ) injection 4 mg (has no administration in time range)  lidocaine  (LIDODERM ) 5 % 1 patch (1 patch Transdermal Patch Applied 07/31/23 1946)  HYDROmorphone  (DILAUDID ) injection 0.5 mg (has no administration in time range)  acetaminophen  (TYLENOL ) tablet 1,000 mg (1,000 mg Oral Given 07/31/23 2224)  0.9 %  sodium chloride  infusion ( Intravenous Restarted 07/31/23 2126)  amLODipine  (NORVASC ) tablet 2.5 mg (has no administration in time range)  atorvastatin  (LIPITOR) tablet 10 mg (has no administration in time range)  ezetimibe  (ZETIA ) tablet 10 mg (has no administration in time range)  gabapentin  (NEURONTIN ) capsule 100 mg (has no administration in time range)  pantoprazole  (PROTONIX ) EC tablet 40 mg (has no administration in time range)  cefTRIAXone  (ROCEPHIN ) 1 g in sodium chloride  0.9 % 100 mL IVPB (has no  administration in time range)  doxycycline  (VIBRA -TABS) tablet 100 mg (has no administration in time range)  acetaminophen  (TYLENOL ) tablet 650 mg (650 mg Oral Given 07/31/23 1704)  cefTRIAXone  (ROCEPHIN ) 1 g in sodium chloride  0.9 % 100 mL IVPB (1 g Intravenous New Bag/Given 07/31/23 1828)  doxycycline  (VIBRA -TABS) tablet 100 mg (100 mg Oral Given 07/31/23 1828)  ketorolac  (TORADOL ) 30 MG/ML injection 30 mg (30 mg Intravenous Given 07/31/23 1823)    ED Course/ Medical Decision Making/ A&P Clinical Course as of 07/31/23 2259  Fri Jul 31, 2023  1759 Dr Curvin general surgery recommending hospitalist admission, conservative care, transfer to [facility] for trauma service consult and monitoring [MT]  1811 Pt and family updated about workup and  admission plans.  Therapeutic O2 ordered for small PTX.  IV toradol .  She remains comfortable in the bed [MT]    Clinical Course User Index [MT] [place] Talcott, Donnice PARAS, MD                                 Medical Decision Making Amount and/or Complexity of Data Reviewed Labs: ordered. Radiology: ordered.  Risk OTC drugs. Prescription drug management. Decision regarding hospitalization.   This patient presents to the ED with concern for syncope vs near syncope, fall, head and rib injury. This involves an extensive number of treatment options, and is a complaint that carries with it a high risk of complications and morbidity.  The differential diagnosis includes viral URI vs anemia vs dehydration vs other  Possible head contusion, with her advanced age, CT imaging ordered to evaluate for ICH  CT C spine and CT chest - suspect likely left sided rib fx  Borderline hypoxia, O2 saturation 89-95% on room air, but no in respiratory distress  Co-morbidities that complicate the patient evaluation: age  Additional history obtained from EMS, family   I ordered and personally interpreted labs.  The pertinent results include:  Influenza A positive.  Hgb wnl.  BMP and trop unremarkable.  I ordered imaging studies including CT imaging I independently visualized and interpreted imaging which showed multiple rib fractures, trace pneumothorax, consolidation lower lungs I agree with the radiologist interpretation  The patient was maintained on a cardiac monitor.  I personally viewed and interpreted the cardiac monitored which showed an underlying rhythm of: regular HR  Per my interpretation the patient's ECG shows no acute ischemic findings  I ordered medication including IV rocephin  and doxycycline  for PNA; toradol  for pain, tylenol  for pain; supplemental oxygen for PTX  I have reviewed the patients home medicines and have made adjustments as needed  Test Considered: doubt acute PE,  meningitis  After the interventions noted above, I reevaluated the patient and found that they have: improved   Dispostion:  After consideration of the diagnostic results and the patients response to treatment, I feel that the patent would benefit from medical admission.         Final Clinical Impression(s) / ED Diagnoses Final diagnoses:  Closed fracture of multiple ribs of left side, initial encounter  Traumatic pneumothorax, sequela  Influenza A    Rx / DC Orders ED Discharge Orders     None         Cottie Donnice PARAS, MD 07/31/23 2300

## 2023-07-31 NOTE — Consult Note (Signed)
 Reason for Consult: Rib fractures Referring Physician: Dr. Cottie Lillie Julie Petty is an 89 y.o. female.  HPI: The patient is an 89 year old white female who is a resident of a assisted living facility.  She has been sick with the flu recently.  She was standing at the sink in her kitchen when she passed out and fell on her left side onto the ground.  She aroused and went back to her bedroom under her own power.  She called the nurses for assistance.  She was brought to the emergency department.  She complains of left-sided chest pain laterally  Past Medical History:  Diagnosis Date   Complication of anesthesia    Essential hypertension    GERD (gastroesophageal reflux disease)    Glaucoma    Hernia    Hyperlipemia    Myocardial infarction (HCC)    NSTEMI 04/24/18 (40% D2 by LHC; Dx: Takotsubo CM vs coronary spasm vs myocarditis)   PONV (postoperative nausea and vomiting)     Past Surgical History:  Procedure Laterality Date   CARDIAC CATHETERIZATION  04/05/03   CHOLECYSTECTOMY N/A 08/17/2018   Procedure: Laparoscopic Cholecystectomy;  Surgeon: Vanderbilt Ned, MD;  Location: [facility] OR;  Service: General;  Laterality: N/A;   KNEE ARTHROSCOPY  09/17/05   left   LEFT HEART CATH AND CORONARY ANGIOGRAPHY N/A 04/26/2018   Procedure: LEFT HEART CATH AND CORONARY ANGIOGRAPHY;  Surgeon: Anner Alm LELON, MD;  Location: [facility] INVASIVE CV LAB;  Service: Cardiovascular;  Laterality: N/A;   VAGINAL HYSTERECTOMY  1980's    Family History  Problem Relation Age of Onset   Heart attack Brother     Social History:  reports that she has never smoked. She has never used smokeless tobacco. She reports that she does not drink alcohol  and does not use drugs.  Allergies:  Allergies  Allergen Reactions   Morphine  And Codeine Other (See Comments)    Acid reflux, sick on stomach   Codeine Nausea Only   Elemental Sulfur Nausea Only    Medications: I have reviewed the patient's current  medications.  Results for orders placed or performed during the hospital encounter of 07/31/23 (from the past 48 hours)  Basic metabolic panel     Status: Abnormal   Collection Time: 07/31/23  3:40 PM  Result Value Ref Range   Sodium 137 135 - 145 mmol/L   Potassium 3.6 3.5 - 5.1 mmol/L   Chloride 101 98 - 111 mmol/L   CO2 26 22 - 32 mmol/L   Glucose, Bld 129 (H) 70 - 99 mg/dL    Comment: Glucose reference range applies only to samples taken after fasting for at least 8 hours.   BUN 27 (H) 8 - 23 mg/dL   Creatinine, Ser 8.94 (H) 0.44 - 1.00 mg/dL   Calcium  9.2 8.9 - 10.3 mg/dL   GFR, Estimated 51 (L) >60 mL/min    Comment: (NOTE) Calculated using the CKD-EPI Creatinine Equation (2021)    Anion gap 10 5 - 15    Comment: Performed at [facility], [address], [place], [place] [address]  CBC     Status: Abnormal   Collection Time: 07/31/23  3:40 PM  Result Value Ref Range   WBC 9.4 4.0 - 10.5 K/uL   RBC 4.53 3.87 - 5.11 MIL/uL   Hemoglobin 15.0 12.0 - 15.0 g/dL   HCT 53.9 63.9 - 53.9 %   MCV 101.5 (H) 80.0 - 100.0 fL   MCH 33.1 26.0 -  34.0 pg   MCHC 32.6 30.0 - 36.0 g/dL   RDW 87.3 88.4 - 84.4 %   Platelets 188 150 - 400 K/uL   nRBC 0.0 0.0 - 0.2 %    Comment: Performed at [facility], [address], [place], [place] [address]  Magnesium      Status: None   Collection Time: 07/31/23  3:40 PM  Result Value Ref Range   Magnesium  2.1 1.7 - 2.4 mg/dL    Comment: Performed at [facility], [address] [place], [place] [address]  Troponin I (High Sensitivity)     Status: None   Collection Time: 07/31/23  3:40 PM  Result Value Ref Range   Troponin I (High Sensitivity) 6 <18 ng/L    Comment: (NOTE) Elevated high sensitivity troponin I (hsTnI) values and significant  changes across serial measurements may suggest ACS but many other  chronic and acute conditions are known to elevate hsTnI results.  Refer to the  Links section for chest pain algorithms and additional  guidance. Performed at [facility], [address] [place], [place] [address]   Resp panel by RT-PCR (RSV, Flu A&B, Covid) Anterior Nasal Swab     Status: Abnormal   Collection Time: 07/31/23  4:03 PM   Specimen: Anterior Nasal Swab  Result Value Ref Range   SARS Coronavirus 2 by RT PCR NEGATIVE NEGATIVE    Comment: (NOTE) SARS-CoV-2 target nucleic acids are NOT DETECTED.  The SARS-CoV-2 RNA is generally detectable in upper respiratory specimens during the acute phase of infection. The lowest concentration of SARS-CoV-2 viral copies this assay can detect is 138 copies/mL. A negative result does not preclude SARS-Cov-2 infection and should not be used as the sole basis for treatment or other patient management decisions. A negative result may occur with  improper specimen collection/handling, submission of specimen other than nasopharyngeal swab, presence of viral mutation(s) within the areas targeted by this assay, and inadequate number of viral copies(<138 copies/mL). A negative result must be combined with clinical observations, patient history, and epidemiological information. The expected result is Negative.  Fact Sheet for Patients:  bloggercourse.com  Fact Sheet for Healthcare Providers:  seriousbroker.it  This test is no t yet approved or cleared by the [place]  FDA and  has been authorized for detection and/or diagnosis of SARS-CoV-2 by FDA under an Emergency Use Authorization (EUA). This EUA will remain  in effect (meaning this test can be used) for the duration of the COVID-19 declaration under Section 564(b)(1) of the Act, 21 U.S.C.section 360bbb-3(b)(1), unless the authorization is terminated  or revoked sooner.       Influenza A by PCR POSITIVE (A) NEGATIVE   Influenza B by PCR NEGATIVE NEGATIVE    Comment: (NOTE) The Xpert  Xpress SARS-CoV-2/FLU/RSV plus assay is intended as an aid in the diagnosis of influenza from Nasopharyngeal swab specimens and should not be used as a sole basis for treatment. Nasal washings and aspirates are unacceptable for Xpert Xpress SARS-CoV-2/FLU/RSV testing.  Fact Sheet for Patients: bloggercourse.com  Fact Sheet for Healthcare Providers: seriousbroker.it  This test is not yet approved or cleared by the [place]  FDA and has been authorized for detection and/or diagnosis of SARS-CoV-2 by FDA under an Emergency Use Authorization (EUA). This EUA will remain in effect (meaning this test can be used) for the duration of the COVID-19 declaration under Section 564(b)(1) of the Act, 21 U.S.C. section 360bbb-3(b)(1), unless the authorization is terminated or revoked.  Resp Syncytial Virus by PCR NEGATIVE NEGATIVE    Comment: (NOTE) Fact Sheet for Patients: bloggercourse.com  Fact Sheet for Healthcare Providers: seriousbroker.it  This test is not yet approved or cleared by the [place]  FDA and has been authorized for detection and/or diagnosis of SARS-CoV-2 by FDA under an Emergency Use Authorization (EUA). This EUA will remain in effect (meaning this test can be used) for the duration of the COVID-19 declaration under Section 564(b)(1) of the Act, 21 U.S.C. section 360bbb-3(b)(1), unless the authorization is terminated or revoked.  Performed at [facility], [address] [place], [place] [address]     CT Chest Wo Contrast Addendum Date: 07/31/2023 ADDENDUM REPORT: 07/31/2023 17:45 ADDENDUM: The original report was by Dr. Ryan Salvage. The following addendum is by Dr. Ryan Salvage: Critical Value/emergent results were called by telephone at the time of interpretation on 07/31/2023 at 5:34 pm to provider MATTHEW [facility] , who verbally  acknowledged these results. Electronically Signed   By: Ryan Salvage M.D.   On: 07/31/2023 17:45   Result Date: 07/31/2023 CLINICAL DATA:  Chest trauma, clinically suspected left rib fractures. Fall, syncope. EXAM: CT CHEST WITHOUT CONTRAST TECHNIQUE: Multidetector CT imaging of the chest was performed following the standard protocol without IV contrast. RADIATION DOSE REDUCTION: This exam was performed according to the departmental dose-optimization program which includes automated exposure control, adjustment of the mA and/or kV according to patient size and/or use of iterative reconstruction technique. COMPARISON:  Chest radiograph 12/21/2019 and chest CT 07/12/2018 FINDINGS: Cardiovascular: Coronary, aortic arch, and branch vessel atherosclerotic vascular disease. Mild cardiomegaly. Mediastinum/Nodes: Unremarkable Lungs/Pleura: Biapical pleuroparenchymal scarring. Airway thickening noted with cylindrical bronchiectasis in the right middle lobe and airway plugging in the right middle lobe and both lower lobes. Clustered reticulonodular opacities in the right middle lobe posteriorly are increased from prior but still favor atypical infectious bronchiolitis based on morphology. Dependent atelectasis noted in both lower lobes. Airway plugging noted in the left upper lobe. There is a 2% or less left pneumothorax noted best seen anterior to the left pericardium. Trace left hemothorax. Upper Abdomen: Abdominal aortic atherosclerosis, including substantial atheromatous plaque at the origins of the celiac trunk and superior mesenteric artery. Musculoskeletal: Acute segmental fractures of the left third, fourth, sixth, seventh, eighth, and tenth ribs noted. Additional acute fractures of the left ninth and eleventh ribs noted. Thoracic kyphosis. IMPRESSION: 1. Acute segmental fractures of the left third, fourth, sixth, seventh, eighth, and tenth ribs. Additional acute fractures of the left ninth and eleventh ribs.  2. There is a 2% or less left pneumothorax noted best seen anterior to the left pericardium. Trace left hemothorax. 3. Airway thickening with cylindrical bronchiectasis in the right middle lobe and airway plugging in the right middle lobe and both lower lobes. Clustered reticulonodular opacities in the right middle lobe posteriorly are increased from prior but still favor atypical infectious bronchiolitis based on morphology. 4. Dependent atelectasis in both lower lobes. 5. Mild cardiomegaly. 6. Coronary, aortic arch, and branch vessel atherosclerotic vascular disease. 7.  Aortic Atherosclerosis (ICD10-I70.0). Radiology assistant personnel have been notified to put me in telephone contact with the referring physician or the referring physician's clinical representative in order to discuss these findings. Once this communication is established I will issue an addendum to this report for documentation purposes. Electronically Signed: By: Ryan Salvage M.D. On: 07/31/2023 17:29   CT Head Wo Contrast Result Date: 07/31/2023 CLINICAL DATA:  Polytrauma, blunt EXAM: CT HEAD WITHOUT CONTRAST CT CERVICAL  SPINE WITHOUT CONTRAST TECHNIQUE: Multidetector CT imaging of the head and cervical spine was performed following the standard protocol without intravenous contrast. Multiplanar CT image reconstructions of the cervical spine were also generated. RADIATION DOSE REDUCTION: This exam was performed according to the departmental dose-optimization program which includes automated exposure control, adjustment of the mA and/or kV according to patient size and/or use of iterative reconstruction technique. COMPARISON:  CT head 07/12/2018. FINDINGS: CT HEAD FINDINGS Brain: No evidence of acute infarction, hemorrhage, hydrocephalus, extra-axial collection or mass lesion/mass effect. Vascular: No hyperdense vessel. Skull: No acute fracture.  Small high left scalp contusion. Sinuses/Orbits: Clear sinuses.  No acute orbital  findings. Other: No mastoid effusions. CT CERVICAL SPINE FINDINGS Alignment: Approximately 2 mm of anterolisthesis of C4 on C5, likely degenerative given severe facet arthropathy on the left at this level. Mild broad levocurvature. Skull base and vertebrae: Vertebral body heights are maintained. No evidence of acute fracture. Osteopenia. Soft tissues and spinal canal: No prevertebral fluid or swelling. No visible canal hematoma. Disc levels: Multilevel facet uncovertebral hypertrophy. Multilevel degenerative disc disease, greatest at C6-C7. Upper chest: Visualized lung apices are clear. IMPRESSION: 1. No evidence of acute abnormality intracranially or in the cervical spine. 2. Small high left scalp contusion. Electronically Signed   By: Gilmore GORMAN Molt M.D.   On: 07/31/2023 16:37   CT Cervical Spine Wo Contrast Result Date: 07/31/2023 CLINICAL DATA:  Polytrauma, blunt EXAM: CT HEAD WITHOUT CONTRAST CT CERVICAL SPINE WITHOUT CONTRAST TECHNIQUE: Multidetector CT imaging of the head and cervical spine was performed following the standard protocol without intravenous contrast. Multiplanar CT image reconstructions of the cervical spine were also generated. RADIATION DOSE REDUCTION: This exam was performed according to the departmental dose-optimization program which includes automated exposure control, adjustment of the mA and/or kV according to patient size and/or use of iterative reconstruction technique. COMPARISON:  CT head 07/12/2018. FINDINGS: CT HEAD FINDINGS Brain: No evidence of acute infarction, hemorrhage, hydrocephalus, extra-axial collection or mass lesion/mass effect. Vascular: No hyperdense vessel. Skull: No acute fracture.  Small high left scalp contusion. Sinuses/Orbits: Clear sinuses.  No acute orbital findings. Other: No mastoid effusions. CT CERVICAL SPINE FINDINGS Alignment: Approximately 2 mm of anterolisthesis of C4 on C5, likely degenerative given severe facet arthropathy on the left at this  level. Mild broad levocurvature. Skull base and vertebrae: Vertebral body heights are maintained. No evidence of acute fracture. Osteopenia. Soft tissues and spinal canal: No prevertebral fluid or swelling. No visible canal hematoma. Disc levels: Multilevel facet uncovertebral hypertrophy. Multilevel degenerative disc disease, greatest at C6-C7. Upper chest: Visualized lung apices are clear. IMPRESSION: 1. No evidence of acute abnormality intracranially or in the cervical spine. 2. Small high left scalp contusion. Electronically Signed   By: Gilmore GORMAN Molt M.D.   On: 07/31/2023 16:37   CT T-SPINE NO CHARGE Result Date: 07/31/2023 CLINICAL DATA:  Fall, syncopal episode EXAM: CT THORACIC SPINE WITHOUT CONTRAST TECHNIQUE: Multidetector CT images of the thoracic were obtained using the standard protocol without intravenous contrast. RADIATION DOSE REDUCTION: This exam was performed according to the departmental dose-optimization program which includes automated exposure control, adjustment of the mA and/or kV according to patient size and/or use of iterative reconstruction technique. COMPARISON:  07/27/2018 MRI thoracic spine, 07/12/2018 CT chest no prior dedicated CT of the thoracic spine FINDINGS: Alignment: Exaggeration of the normal thoracic kyphosis. Trace anterolisthesis of C7 on T1. No other significant listhesis. Vertebrae: No acute fracture or suspicious osseous lesion. Chronic mild wedging of T7, T8, and T9.  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 For additional findings in the thorax, including rib findings, please see same-day CT chest. Disc levels: No significant spinal canal stenosis or neural foraminal narrowing. IMPRESSION: No acute fracture or traumatic listhesis in the thoracic spine. Electronically Signed   By: Donald Campion M.D.    On: 07/31/2023 16:37    Review of Systems  Constitutional:  Positive for fatigue.  HENT:  Positive for congestion.   Eyes: Negative.   Respiratory:  Positive for cough.   Cardiovascular:  Positive for chest pain.  Gastrointestinal: Negative.   Endocrine: Negative.   Genitourinary: Negative.   Musculoskeletal: Negative.   Skin: Negative.   Allergic/Immunologic: Negative.   Neurological: Negative.   Hematological: Negative.   Psychiatric/Behavioral: Negative.     Blood pressure 135/66, pulse 60, temperature 97.9 F (36.6 C), temperature source Oral, resp. rate 20, SpO2 97%. Physical Exam Vitals reviewed.  Constitutional:      General: She is not in acute distress.    Appearance: Normal appearance. She is normal weight.  HENT:     Head: Normocephalic and atraumatic.     Right Ear: External ear normal.     Left Ear: External ear normal.     Nose: Nose normal.     Mouth/Throat:     Mouth: Mucous membranes are moist.     Pharynx: Oropharynx is clear.  Eyes:     General: No scleral icterus.    Extraocular Movements: Extraocular movements intact.     Conjunctiva/sclera: Conjunctivae normal.     Pupils: Pupils are equal, round, and reactive to light.  Cardiovascular:     Rate and Rhythm: Normal rate and regular rhythm.     Pulses: Normal pulses.  Pulmonary:     Effort: Pulmonary effort is normal. No respiratory distress.     Breath sounds: Normal breath sounds.     Comments: There is left chest wall tenderness laterally Abdominal:     General: Abdomen is flat.     Palpations: Abdomen is soft.     Tenderness: There is no abdominal tenderness.  Musculoskeletal:        General: No swelling or deformity. Normal range of motion.     Cervical back: Normal range of motion and neck supple. No rigidity or tenderness.  Skin:    General: Skin is warm and dry.     Coloration: Skin is not jaundiced.  Neurological:     General: No focal deficit present.     Mental Status: She is  alert and oriented to person, place, and time.  Psychiatric:        Mood and Affect: Mood normal.        Behavior: Behavior normal.     Assessment/Plan: The patient experienced a fall from level ground.  She has multiple left-sided lateral rib fractures with a single dot of pneumothorax air anterior to the mediastinum area.  She also has the flu.  At this point she is not having any respiratory distress.  She will need aggressive pulmonary toilet and admission to the medical service.  The management for her rib fractures will be pain control.  She may benefit from being transferred to [facility] so that she can be followed by the trauma service.  The general surgery service at [facility] will sign off.  Deward Null III 07/31/2023,  6:51 PM

## 2023-07-31 NOTE — ED Notes (Signed)
 Patient transported to CT

## 2023-07-31 NOTE — ED Notes (Signed)
 Placed pt on 2L NC.

## 2023-07-31 NOTE — ED Triage Notes (Signed)
 Patient presents post fall due to syncope today,  in which she may have hit her head. Tenderness reported on the back of her head. She also complains of tenderness to the left lateral chest wall and believes she may have broken ribs. She does not remember the fall. Not on blood thinners. For about 48 hours she has suffered with nausea and vomiting.      EMS vitals 140/60 BP 64 HR 18 RR 94% SPO2 on room air 155 CBG

## 2023-07-31 NOTE — H&P (Signed)
 History and Physical  MONA AYARS FMW:[id number] DOB: 05-14-1932 DOA: 07/31/2023  PCP: Jacques Camie Pepper, PA-C   Chief Complaint: Fall, syncope, generalized weakness  HPI: Julie Petty is a 89 y.o. female with medical history significant for HTN, HLD, GERD, lumbar radiculopathy, and NSTEMI/CAD who presents from her assisted living facility to the ED for evaluation after a fall.  Patient reports she was diagnosed with the flu 3 to 4 days ago due to persistent cough, fatigue, runny nose. She was given cough and cold medications that she has been taking but her symptoms did not improve and she has been persistently weak over the last few days. Today, while she was getting some water at her kitchen sink, she felt lightheaded before she could sit down, she fell hitting her left side and head on the floor.  She arose right away, walked back to her bedroom and called the nurse for assistance.  She endorses left-sided pain, generalized weakness and persistent dry cough but denies any chest pain, palpitations, shortness of breath, dysuria, headache or vision changes.  ED Course: Initial vitals showed afebrile, mild bradycardia with HR in the 50s, hypertensive with BP of 174/65 and SpO2 of 93% on room air.  Labs significant for positive influenza A PCR test.  CT chest shows acute segmental fractures of the left 3rd-10th ribs with very small left pneumothorax and trace left hemothorax as well as opacities in the right middle lobe concerning for atypical infectious bronchiolitis. Patient received IV Toradol  30 mg x 1 IV Rocephin , oral doxycycline  and Tylenol  650 mg x 1.  General surgery was consulted for evaluation and they recommended transfer to [facility] for trauma surgery evaluation. [facility] was consulted for admission  Review of Systems: Please see HPI for pertinent positives and negatives. A complete 10 system review of systems are otherwise negative.  Past Medical History:  Diagnosis Date    Complication of anesthesia    Essential hypertension    GERD (gastroesophageal reflux disease)    Glaucoma    Hernia    Hyperlipemia    Myocardial infarction (HCC)    NSTEMI 04/24/18 (40% D2 by LHC; Dx: Takotsubo CM vs coronary spasm vs myocarditis)   PONV (postoperative nausea and vomiting)    Past Surgical History:  Procedure Laterality Date   CARDIAC CATHETERIZATION  04/05/03   CHOLECYSTECTOMY N/A 08/17/2018   Procedure: Laparoscopic Cholecystectomy;  Surgeon: Vanderbilt Ned, MD;  Location: [facility] OR;  Service: General;  Laterality: N/A;   KNEE ARTHROSCOPY  09/17/05   left   LEFT HEART CATH AND CORONARY ANGIOGRAPHY N/A 04/26/2018   Procedure: LEFT HEART CATH AND CORONARY ANGIOGRAPHY;  Surgeon: Anner Alm LELON, MD;  Location: [facility] INVASIVE CV LAB;  Service: Cardiovascular;  Laterality: N/A;   VAGINAL HYSTERECTOMY  1980's   Social History:  reports that she has never smoked. She has never used smokeless tobacco. She reports that she does not drink alcohol  and does not use drugs.  Allergies  Allergen Reactions   Morphine  And Codeine Other (See Comments)    Acid reflux, sick on stomach   Codeine Nausea Only   Elemental Sulfur Nausea Only    Family History  Problem Relation Age of Onset   Heart attack Brother      Prior to Admission medications   Medication Sig Start Date End Date Taking? Authorizing Provider  amLODipine  (NORVASC ) 5 MG tablet Take 0.5 tablets (2.5 mg total) by mouth at bedtime. 05/07/18   Meng, Hao, PA  brinzolamide  (AZOPT ) 1 %  ophthalmic suspension Place 1 drop into both eyes 2 (two) times daily.     [provider]  Calcium  Carb-Cholecalciferol  (CALCIUM  600/VITAMIN D3 PO) Take 1 tablet by mouth every other day.    [provider]  gabapentin  (NEURONTIN ) 100 MG capsule Take 100 mg by mouth daily.    [provider]  metoprolol  succinate (TOPROL -XL) 25 MG 24 hr tablet Take 0.5 tablets (12.5 mg total) by mouth daily. SCHEDULE OFFICE VISIT FOR  FUTURE REFILLS 07/08/21   Janene Boer, PA  nitroGLYCERIN  (NITROSTAT ) 0.4 MG SL tablet Place 1 tablet (0.4 mg total) under the tongue every 5 (five) minutes x 3 doses as needed for chest pain. 04/28/18   Henry Manuelita NOVAK, NP  omeprazole  (PRILOSEC) 40 MG capsule Take 40 mg by mouth daily.    [provider]  rosuvastatin  (CRESTOR ) 5 MG tablet Take 5 mg by mouth daily. Patient not taking: Reported on 01/30/2022    [provider]  triamcinolone (KENALOG) 0.025 % ointment Apply 1 application topically every other day.  03/22/18   [provider]  TURMERIC PO Take by mouth.    [provider]    Physical Exam: BP 135/66   Pulse 60   Temp 97.9 F (36.6 C) (Oral)   Resp 20   SpO2 97%  General: Pleasant, well-appearing elderly woman laying in bed. No acute distress. HEENT: Small occipital hematoma. Anicteric sclera. Dry mucous membrane. CV: Mild bradycardia. Regular rhythm. No murmurs, rubs, or gallops. No LE edema Chest wall: Mild tenderness to palpation of the left lateral ribs. Pulmonary: On 2 L [place]. Lungs CTAB. Normal effort. No wheezing or rales. Abdominal: Soft, nontender, nondistended. Normal bowel sounds. Extremities: Palpable radial and DP pulses. Normal ROM. Skin: Warm and dry. No obvious rash or lesions. Neuro: A&Ox3. Moves all extremities. Normal sensation to light touch. No focal deficit. Psych: Normal mood and affect          Labs on Admission:  Basic Metabolic Panel: Recent Labs  Lab 07/31/23 1540  NA 137  K 3.6  CL 101  CO2 26  GLUCOSE 129*  BUN 27*  CREATININE 1.05*  CALCIUM  9.2  MG 2.1   Liver Function Tests: No results for input(s): AST, ALT, ALKPHOS, BILITOT, PROT, ALBUMIN in the last 168 hours. No results for input(s): LIPASE, AMYLASE in the last 168 hours. No results for input(s): AMMONIA in the last 168 hours. CBC: Recent Labs  Lab 07/31/23 1540  WBC 9.4  HGB 15.0  HCT 46.0  MCV 101.5*  PLT 188    Cardiac Enzymes: No results for input(s): CKTOTAL, CKMB, CKMBINDEX, TROPONINI in the last 168 hours. BNP (last 3 results) No results for input(s): BNP in the last 8760 hours.  ProBNP (last 3 results) No results for input(s): PROBNP in the last 8760 hours.  CBG: No results for input(s): GLUCAP in the last 168 hours.  Radiological Exams on Admission: CT Chest Wo Contrast Addendum Date: 07/31/2023 ADDENDUM REPORT: 07/31/2023 17:45 ADDENDUM: The original report was by Dr. Ryan Salvage. The following addendum is by Dr. Ryan Salvage: Critical Value/emergent results were called by telephone at the time of interpretation on 07/31/2023 at 5:34 pm to provider MATTHEW [facility] , who verbally acknowledged these results. Electronically Signed   By: Ryan Salvage M.D.   On: 07/31/2023 17:45   Result Date: 07/31/2023 CLINICAL DATA:  Chest trauma, clinically suspected left rib fractures. Fall, syncope. EXAM: CT CHEST WITHOUT CONTRAST TECHNIQUE: Multidetector CT imaging of the chest was  performed following the standard protocol without IV contrast. RADIATION DOSE REDUCTION: This exam was performed according to the departmental dose-optimization program which includes automated exposure control, adjustment of the mA and/or kV according to patient size and/or use of iterative reconstruction technique. COMPARISON:  Chest radiograph 12/21/2019 and chest CT 07/12/2018 FINDINGS: Cardiovascular: Coronary, aortic arch, and branch vessel atherosclerotic vascular disease. Mild cardiomegaly. Mediastinum/Nodes: Unremarkable Lungs/Pleura: Biapical pleuroparenchymal scarring. Airway thickening noted with cylindrical bronchiectasis in the right middle lobe and airway plugging in the right middle lobe and both lower lobes. Clustered reticulonodular opacities in the right middle lobe posteriorly are increased from prior but still favor atypical infectious bronchiolitis based on morphology. Dependent  atelectasis noted in both lower lobes. Airway plugging noted in the left upper lobe. There is a 2% or less left pneumothorax noted best seen anterior to the left pericardium. Trace left hemothorax. Upper Abdomen: Abdominal aortic atherosclerosis, including substantial atheromatous plaque at the origins of the celiac trunk and superior mesenteric artery. Musculoskeletal: Acute segmental fractures of the left third, fourth, sixth, seventh, eighth, and tenth ribs noted. Additional acute fractures of the left ninth and eleventh ribs noted. Thoracic kyphosis. IMPRESSION: 1. Acute segmental fractures of the left third, fourth, sixth, seventh, eighth, and tenth ribs. Additional acute fractures of the left ninth and eleventh ribs. 2. There is a 2% or less left pneumothorax noted best seen anterior to the left pericardium. Trace left hemothorax. 3. Airway thickening with cylindrical bronchiectasis in the right middle lobe and airway plugging in the right middle lobe and both lower lobes. Clustered reticulonodular opacities in the right middle lobe posteriorly are increased from prior but still favor atypical infectious bronchiolitis based on morphology. 4. Dependent atelectasis in both lower lobes. 5. Mild cardiomegaly. 6. Coronary, aortic arch, and branch vessel atherosclerotic vascular disease. 7.  Aortic Atherosclerosis (ICD10-I70.0). Radiology assistant personnel have been notified to put me in telephone contact with the referring physician or the referring physician's clinical representative in order to discuss these findings. Once this communication is established I will issue an addendum to this report for documentation purposes. Electronically Signed: By: Ryan Salvage M.D. On: 07/31/2023 17:29   CT Head Wo Contrast Result Date: 07/31/2023 CLINICAL DATA:  Polytrauma, blunt EXAM: CT HEAD WITHOUT CONTRAST CT CERVICAL SPINE WITHOUT CONTRAST TECHNIQUE: Multidetector CT imaging of the head and cervical spine was  performed following the standard protocol without intravenous contrast. Multiplanar CT image reconstructions of the cervical spine were also generated. RADIATION DOSE REDUCTION: This exam was performed according to the departmental dose-optimization program which includes automated exposure control, adjustment of the mA and/or kV according to patient size and/or use of iterative reconstruction technique. COMPARISON:  CT head 07/12/2018. FINDINGS: CT HEAD FINDINGS Brain: No evidence of acute infarction, hemorrhage, hydrocephalus, extra-axial collection or mass lesion/mass effect. Vascular: No hyperdense vessel. Skull: No acute fracture.  Small high left scalp contusion. Sinuses/Orbits: Clear sinuses.  No acute orbital findings. Other: No mastoid effusions. CT CERVICAL SPINE FINDINGS Alignment: Approximately 2 mm of anterolisthesis of C4 on C5, likely degenerative given severe facet arthropathy on the left at this level. Mild broad levocurvature. Skull base and vertebrae: Vertebral body heights are maintained. No evidence of acute fracture. Osteopenia. Soft tissues and spinal canal: No prevertebral fluid or swelling. No visible canal hematoma. Disc levels: Multilevel facet uncovertebral hypertrophy. Multilevel degenerative disc disease, greatest at C6-C7. Upper chest: Visualized lung apices are clear. IMPRESSION: 1. No evidence of acute abnormality intracranially or in the cervical spine. 2. Small  high left scalp contusion. Electronically Signed   By: Gilmore GORMAN Molt M.D.   On: 07/31/2023 16:37   CT Cervical Spine Wo Contrast Result Date: 07/31/2023 CLINICAL DATA:  Polytrauma, blunt EXAM: CT HEAD WITHOUT CONTRAST CT CERVICAL SPINE WITHOUT CONTRAST TECHNIQUE: Multidetector CT imaging of the head and cervical spine was performed following the standard protocol without intravenous contrast. Multiplanar CT image reconstructions of the cervical spine were also generated. RADIATION DOSE REDUCTION: This exam was  performed according to the departmental dose-optimization program which includes automated exposure control, adjustment of the mA and/or kV according to patient size and/or use of iterative reconstruction technique. COMPARISON:  CT head 07/12/2018. FINDINGS: CT HEAD FINDINGS Brain: No evidence of acute infarction, hemorrhage, hydrocephalus, extra-axial collection or mass lesion/mass effect. Vascular: No hyperdense vessel. Skull: No acute fracture.  Small high left scalp contusion. Sinuses/Orbits: Clear sinuses.  No acute orbital findings. Other: No mastoid effusions. CT CERVICAL SPINE FINDINGS Alignment: Approximately 2 mm of anterolisthesis of C4 on C5, likely degenerative given severe facet arthropathy on the left at this level. Mild broad levocurvature. Skull base and vertebrae: Vertebral body heights are maintained. No evidence of acute fracture. Osteopenia. Soft tissues and spinal canal: No prevertebral fluid or swelling. No visible canal hematoma. Disc levels: Multilevel facet uncovertebral hypertrophy. Multilevel degenerative disc disease, greatest at C6-C7. Upper chest: Visualized lung apices are clear. IMPRESSION: 1. No evidence of acute abnormality intracranially or in the cervical spine. 2. Small high left scalp contusion. Electronically Signed   By: Gilmore GORMAN Molt M.D.   On: 07/31/2023 16:37   CT T-SPINE NO CHARGE Result Date: 07/31/2023 CLINICAL DATA:  Fall, syncopal episode EXAM: CT THORACIC SPINE WITHOUT CONTRAST TECHNIQUE: Multidetector CT images of the thoracic were obtained using the standard protocol without intravenous contrast. RADIATION DOSE REDUCTION: This exam was performed according to the departmental dose-optimization program which includes automated exposure control, adjustment of the mA and/or kV according to patient size and/or use of iterative reconstruction technique. COMPARISON:  07/27/2018 MRI thoracic spine, 07/12/2018 CT chest no prior dedicated CT of the thoracic spine  FINDINGS: Alignment: Exaggeration of the normal thoracic kyphosis. Trace anterolisthesis of C7 on T1. No other significant listhesis. Vertebrae: No acute fracture or suspicious osseous lesion. Chronic mild wedging of T7, T8, and T9. 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 For additional findings in the thorax, including rib findings, please see same-day CT chest. Disc levels: No significant spinal canal stenosis or neural foraminal narrowing. IMPRESSION: No acute fracture or traumatic listhesis in the thoracic spine. Electronically Signed   By: Donald Campion M.D.   On: 07/31/2023 16:37   Assessment/Plan Julie Petty is a 89 y.o. female with medical history significant for  HTN, HLD, GERD, lumbar radiculopathy, and NSTEMI/CAD who presents from her assisted living facility to the ED for evaluation after a fall and found to have multiple left rib fractures and pneumonia.  # Fall # Multiple rib fractures # Generalized weakness Elderly patient with recent respiratory infection with the flu causing generalized weakness presented after a fall in her kitchen in the setting of dehydration. He has mild occipital hematoma but negative CT head and cervical spine. Chest imaging reveals acute fractures from prior 3rd-10th ribs on the left side with very tiny hematoma. She is in no respiratory distress but reports left rib pain with movement or  cough.  General surgery evaluated patient at the bedside and recommended transfer to [facility] for evaluation by trauma surgery. -Admitted to med telemetry bed at [facility] General Surgery evaluated patient at the bedside, signed off -Consult trauma surgery when patient arrives at [facility] IV NS at 100 cc/h -Multimodal pain control with Tylenol , lidocaine  patch and as needed IV Dilaudid  -Continue  supplemental O2 -Pulmonary toiletry and incentive spirometer -Fall precautions -PT/OT eval  # Community-acquired pneumonia # Influenza A infection Patient reports about 1 week cough cold and cough symptoms. She tested positive for influenza A and reports taking cold and cough medications.  Chills but continues to have persistent dry cough and generalized weakness. Respiratory panel positive for influenza A infection but negative for flu RSV.  CT chest shows some opacities in the right middle lobe concerning for pneumonia. -Continue IV Rocephin  and oral doxycycline  -Mucinex  DM twice daily -Check sputum culture, and procalcitonin -Trend CBC, fever curve  # Sinus bradycardia EKG on admission shows sinus bradycardia with incomplete RBBB. On chart review, patient has a history of bradycardia dating back to EKGs in 2019. No electrolyte abnormalities. HR persistently in the 50s. -Telemetry -Hold home beta-blocker -Follow-up TSH  # HTN BP initially elevated on admission now stable with SBP in the 100s to 130s. -Continue amlodipine  -Hold Toprol -XL due to bradycardia  # HLD -Continue atorvastatin  and Zetia   # Lumbar radiculopathy -Continue gabapentin   # GERD -PPI   DVT prophylaxis: Lovenox      Code Status: Limited: Do not attempt resuscitation (DNR) -DNR-LIMITED -Do Not Intubate/DNI   Consults called: General Surgery  Family Communication: Discussed admission with son at bedside  Severity of Illness: The appropriate patient status for this patient is INPATIENT. Inpatient status is judged to be reasonable and necessary in order to provide the required intensity of service to ensure the patient's safety. The patient's presenting symptoms, physical exam findings, and initial radiographic and laboratory data in the context of their chronic comorbidities is felt to place them at high risk for further clinical deterioration. Furthermore, it is not anticipated that the patient will be  medically stable for discharge from the hospital within 2 midnights of admission.   * I certify that at the point of admission it is my clinical judgment that the patient will require inpatient hospital care spanning beyond 2 midnights from the point of admission due to high intensity of service, high risk for further deterioration and high frequency of surveillance required.*  Level of care:  Med telemetry  Lou Claretta HERO, MD 07/31/2023, 7:00 PM [facility] Pager: [phone] Isaiah 41:10   If 7PM-7AM, please contact night-coverage [email or web address] Password [email or web address]

## 2023-08-01 ENCOUNTER — Inpatient Hospital Stay (HOSPITAL_COMMUNITY): Payer: Medicare Other

## 2023-08-01 DIAGNOSIS — R55 Syncope and collapse: Secondary | ICD-10-CM

## 2023-08-01 DIAGNOSIS — J101 Influenza due to other identified influenza virus with other respiratory manifestations: Secondary | ICD-10-CM | POA: Diagnosis not present

## 2023-08-01 DIAGNOSIS — S2242XD Multiple fractures of ribs, left side, subsequent encounter for fracture with routine healing: Secondary | ICD-10-CM

## 2023-08-01 LAB — URINALYSIS, ROUTINE W REFLEX MICROSCOPIC
Bilirubin Urine: NEGATIVE
Glucose, UA: NEGATIVE mg/dL
Hgb urine dipstick: NEGATIVE
Ketones, ur: NEGATIVE mg/dL
Leukocytes,Ua: NEGATIVE
Nitrite: NEGATIVE
Protein, ur: NEGATIVE mg/dL
Specific Gravity, Urine: 1.015 (ref 1.005–1.030)
pH: 5 (ref 5.0–8.0)

## 2023-08-01 LAB — BASIC METABOLIC PANEL
Anion gap: 8 (ref 5–15)
BUN: 23 mg/dL (ref 8–23)
CO2: 24 mmol/L (ref 22–32)
Calcium: 9.1 mg/dL (ref 8.9–10.3)
Chloride: 106 mmol/L (ref 98–111)
Creatinine, Ser: 0.94 mg/dL (ref 0.44–1.00)
GFR, Estimated: 58 mL/min — ABNORMAL LOW (ref >60)
Glucose, Bld: 113 mg/dL — ABNORMAL HIGH (ref 70–99)
Potassium: 3.5 mmol/L (ref 3.5–5.1)
Sodium: 138 mmol/L (ref 135–145)

## 2023-08-01 LAB — VITAMIN D 25 HYDROXY (VIT D DEFICIENCY, FRACTURES): Vit D, 25-Hydroxy: 32.26 ng/mL (ref 30–100)

## 2023-08-01 LAB — TSH: TSH: 0.712 u[IU]/mL (ref 0.350–4.500)

## 2023-08-01 LAB — MRSA NEXT GEN BY PCR, NASAL: MRSA by PCR Next Gen: NOT DETECTED

## 2023-08-01 LAB — ECHOCARDIOGRAM COMPLETE
Area-P 1/2: 3.42 cm2
Calc EF: 66.6 %
Height: 62.992 in
P 1/2 time: 689 ms
S' Lateral: 1.9 cm
Single Plane A2C EF: 63.9 %
Single Plane A4C EF: 70.6 %
Weight: 2098.78 [oz_av]

## 2023-08-01 LAB — CBC
HCT: 37.4 % (ref 36.0–46.0)
Hemoglobin: 12.4 g/dL (ref 12.0–15.0)
MCH: 33.1 pg (ref 26.0–34.0)
MCHC: 33.2 g/dL (ref 30.0–36.0)
MCV: 99.7 fL (ref 80.0–100.0)
Platelets: 160 10*3/uL (ref 150–400)
RBC: 3.75 MIL/uL — ABNORMAL LOW (ref 3.87–5.11)
RDW: 12.6 % (ref 11.5–15.5)
WBC: 4.3 10*3/uL (ref 4.0–10.5)
nRBC: 0 % (ref 0.0–0.2)

## 2023-08-01 LAB — PROCALCITONIN: Procalcitonin: <0.1 ng/mL

## 2023-08-01 MED ORDER — BRINZOLAMIDE 1 % OP SUSP
1.0000 [drp] | Freq: Two times a day (BID) | OPHTHALMIC | Status: DC
  Administered 2023-08-01 – 2023-08-04 (×6): 1 [drp] via OPHTHALMIC
  Filled 2023-08-01: qty 10

## 2023-08-01 MED ORDER — LATANOPROST 0.005 % OP SOLN
1.0000 [drp] | Freq: Every day | OPHTHALMIC | Status: DC
  Administered 2023-08-01 – 2023-08-03 (×3): 1 [drp] via OPHTHALMIC
  Filled 2023-08-01: qty 2.5

## 2023-08-01 MED ORDER — CEPHALEXIN 250 MG PO CAPS
250.0000 mg | ORAL_CAPSULE | Freq: Every day | ORAL | Status: DC
  Administered 2023-08-01 – 2023-08-03 (×3): 250 mg via ORAL
  Filled 2023-08-01 (×4): qty 1

## 2023-08-01 MED ORDER — VITAMIN D 25 MCG (1000 UNIT) PO TABS
5000.0000 [IU] | ORAL_TABLET | Freq: Every day | ORAL | Status: DC
  Administered 2023-08-02 – 2023-08-04 (×3): 5000 [IU] via ORAL
  Filled 2023-08-01 (×3): qty 5

## 2023-08-01 MED ORDER — VITAMIN D 25 MCG (1000 UNIT) PO TABS
1000.0000 [IU] | ORAL_TABLET | Freq: Every day | ORAL | Status: DC
  Administered 2023-08-01: 1000 [IU] via ORAL
  Filled 2023-08-01: qty 1

## 2023-08-01 MED ORDER — POLYVINYL ALCOHOL 1.4 % OP SOLN: 1.0000 [drp] | OPHTHALMIC | Status: DC | PRN

## 2023-08-01 NOTE — Plan of Care (Signed)

## 2023-08-01 NOTE — Progress Notes (Addendum)
 PROGRESS NOTE   Julie Petty  FMW:[id number]    DOB: [birth year]    DOA: 07/31/2023  PCP: Jacques Camie Pepper, PA-C   I have briefly reviewed patients previous medical records in [facility]  Chief Complaint  Patient presents with   Fall   Emesis   Loss of Consciousness    Brief Hospital Course:  89 year old female, lives alone at an independent living facility, independent and still drives, medical history significant for HTN, HLD, GERD, mild CAD by cardiac cath in 2019, Takotsubo syndrome with complete recovery, recurrent UTIs on Keflex , presented to the [facility] ED via EMS following an episode of syncope, fall, head and left-sided chest pain.  She was seen at an urgent care on 2/4 for URTI symptoms, diagnosed with possible viral illness, reports that her symptoms did not get better with the 2 medications she was given, was not given Tamiflu).  She had ongoing persistent cough, fatigue, runny nose with progressive weakness for 3 to 4 days PTA.  On day of admission, while attempting to get water from her kitchen sink, felt lightheaded, passed out, fell hitting her head and left forearm and left side of her chest.  She states that she may have passed out for about 2 minutes, then was able to call for assistance.  Admitted for confirmed influenza A, several (about 10) left-sided rib fractures, tiny left pneumothorax (resolved on follow-up chest x-ray).  Seen by general surgery at [facility] who recommended transfer to [facility] for evaluation by their trauma colleagues.   Assessment & Plan:  Principal Problem:   Fall Active Problems:   Multiple closed fractures of ribs of left side   Influenza A   Pneumothorax, traumatic   Community acquired pneumonia of right middle lobe of lung   Influenza A with acute bronchitis Afebrile and hemodynamically stable since admission Not sure if she is truly hypoxic, will have RN reassess. Supportive treatment and monitor. Out of window for  initiation of Tamiflu. Procalcitonin negative, discontinued antibiotics. Consider repeating CT chest without contrast in 4 weeks to ensure resolution of abnormal findings or further evaluation as deemed necessary.  Syncope with collapse Suspected due to progressive influenza A symptoms, poor oral intake and dehydration Continue brief and gentle IV fluid hydration. Telemetry shows sinus bradycardia in the 50s without arrhythmia.  Continue telemetry Check orthostatic vital signs Last echo in 2019 had preserved EF.  Will update 2D echo  Multiple (8) left rib fractures/tiny left pneumothorax Sustained post fall Initial CT had shown tiny left pneumothorax. Acute fracture of left 3, 4, 6, 7, 8, 9, 10 and 11th rib. Follow-up chest x-ray 2/8 without evidence of pneumothorax. Trauma MD follow-up appreciated.  Agree with incentive spirometry. Multimodality pain control (scheduled Tylenol , lidocaine  patch).  Minimize opioids or sedatives.  Trauma service signed off 2/8 PT and OT evaluation.  OT is already recommending SNF.  TOC consulted.  Fall at home Details as above CT head, C-spine: No acute bony abnormalities reported. Small high left scalp contusion CT chest with abnormal findings i.e. questionable bronchiectasis reported but procalcitonin negative.?  Chronic versus viral.  No antibiotics indicated.  Essential hypertension: Check orthostatics Continuing amlodipine  2.5 Mg daily. Toprol -XL held on admission due to bradycardia (TSH 0.712).  Hyperlipidemia Continue atorvastatin  and Zetia   GERD Continue PPI  Vitamin D  insufficiency Vitamin D  25-hydroxy: 32.26.  Started vitamin D  supplements.  Glaucoma After pharmacy has verified home meds and completed home med rec, resume ophthalmic medications  History of  recurrent UTI Reportedly on Keflex  After pharmacy has verified home meds and completed home med rec, resume Keflex   Body mass index is 23.24 kg/m.   DVT prophylaxis:  enoxaparin  (LOVENOX ) injection 40 mg Start: 07/31/23 2200     Code Status: Limited: Do not attempt resuscitation (DNR) -DNR-LIMITED -Do Not Intubate/DNI :  Family Communication: Sister at bedside Disposition:  Status is: Inpatient Several rib fracture with pain for pain management and further evaluation of syncope with fall.     Consultants:   General Surgery/trauma  Procedures:   None  Antimicrobials:   Discontinued ceftriaxone  and doxycycline .   Subjective:  Seen this morning.  Sister at bedside.  Most of history as noted above.  Hard of hearing.  Does not have hearing aids here.  Mild left-sided rib cage pain on deep inspiration.  No dyspnea, cough, fever, chills or generalized bodyaches reported.  No nausea or vomiting.  Attempted to have a BM this morning but only passed flatus.  Objective:   Vitals:   07/31/23 2133 08/01/23 0000 08/01/23 0400 08/01/23 0933  BP: (!) 160/62 (!) 146/67 (!) 146/65 (!) 149/71  Pulse: (!) 57 (!) 55 (!) 53 (!) 58  Resp: 19 19 19 16   Temp: 97.8 F (36.6 C) 97.7 F (36.5 C) 97.9 F (36.6 C) 98.2 F (36.8 C)  TempSrc: Oral Oral Oral Oral  SpO2: 95% 99% 96% 96%  Weight: 59.5 kg     Height: 5' 2.99 (1.6 m)       General exam: Elderly female, moderately built and nourished lying comfortably propped up in bed without distress. Scalp: Unable to clearly identify the contusion reported on imaging. Respiratory system: Clear to auscultation. Respiratory effort normal.  Mild tenderness of left anterior rib cage but no clear point tenderness.  Also no external bruising. Cardiovascular system: S1 & S2 heard, RRR. No JVD, murmurs, rubs, gallops or clicks. No pedal edema.  Telemetry personally reviewed: Sinus bradycardia in the 50s. Gastrointestinal system: Abdomen is nondistended, soft and nontender. No organomegaly or masses felt. Normal bowel sounds heard. Central nervous system: Alert and oriented. No focal neurological deficits.  Hard of  hearing. Extremities: Symmetric 5 x 5 power.  Small dressing near the left elbow, small blister underneath per patient/sisters report. Skin: No rashes, lesions or ulcers Psychiatry: Judgement and insight appear normal. Mood & affect appropriate.     Data Reviewed:   I have personally reviewed following labs and imaging studies   CBC: Recent Labs  Lab 07/31/23 1540 08/01/23 0410  WBC 9.4 4.3  HGB 15.0 12.4  HCT 46.0 37.4  MCV 101.5* 99.7  PLT 188 160    Basic Metabolic Panel: Recent Labs  Lab 07/31/23 1540 08/01/23 0410  NA 137 138  K 3.6 3.5  CL 101 106  CO2 26 24  GLUCOSE 129* 113*  BUN 27* 23  CREATININE 1.05* 0.94  CALCIUM  9.2 9.1  MG 2.1  --     Liver Function Tests: No results for input(s): AST, ALT, ALKPHOS, BILITOT, PROT, ALBUMIN in the last 168 hours.  CBG: No results for input(s): GLUCAP in the last 168 hours.  Microbiology Studies:   Recent Results (from the past 240 hours)  Resp panel by RT-PCR (RSV, Flu A&B, Covid) Anterior Nasal Swab     Status: Abnormal   Collection Time: 07/31/23  4:03 PM   Specimen: Anterior Nasal Swab  Result Value Ref Range Status   SARS Coronavirus 2 by RT PCR NEGATIVE NEGATIVE Final  Comment: (NOTE) SARS-CoV-2 target nucleic acids are NOT DETECTED.  The SARS-CoV-2 RNA is generally detectable in upper respiratory specimens during the acute phase of infection. The lowest concentration of SARS-CoV-2 viral copies this assay can detect is 138 copies/mL. A negative result does not preclude SARS-Cov-2 infection and should not be used as the sole basis for treatment or other patient management decisions. A negative result may occur with  improper specimen collection/handling, submission of specimen other than nasopharyngeal swab, presence of viral mutation(s) within the areas targeted by this assay, and inadequate number of viral copies(<138 copies/mL). A negative result must be combined with clinical  observations, patient history, and epidemiological information. The expected result is Negative.  Fact Sheet for Patients:  bloggercourse.com  Fact Sheet for Healthcare Providers:  seriousbroker.it  This test is no t yet approved or cleared by the [place]  FDA and  has been authorized for detection and/or diagnosis of SARS-CoV-2 by FDA under an Emergency Use Authorization (EUA). This EUA will remain  in effect (meaning this test can be used) for the duration of the COVID-19 declaration under Section 564(b)(1) of the Act, 21 U.S.C.section 360bbb-3(b)(1), unless the authorization is terminated  or revoked sooner.       Influenza A by PCR POSITIVE (A) NEGATIVE Final   Influenza B by PCR NEGATIVE NEGATIVE Final    Comment: (NOTE) The Xpert Xpress SARS-CoV-2/FLU/RSV plus assay is intended as an aid in the diagnosis of influenza from Nasopharyngeal swab specimens and should not be used as a sole basis for treatment. Nasal washings and aspirates are unacceptable for Xpert Xpress SARS-CoV-2/FLU/RSV testing.  Fact Sheet for Patients: bloggercourse.com  Fact Sheet for Healthcare Providers: seriousbroker.it  This test is not yet approved or cleared by the [place]  FDA and has been authorized for detection and/or diagnosis of SARS-CoV-2 by FDA under an Emergency Use Authorization (EUA). This EUA will remain in effect (meaning this test can be used) for the duration of the COVID-19 declaration under Section 564(b)(1) of the Act, 21 U.S.C. section 360bbb-3(b)(1), unless the authorization is terminated or revoked.     Resp Syncytial Virus by PCR NEGATIVE NEGATIVE Final    Comment: (NOTE) Fact Sheet for Patients: bloggercourse.com  Fact Sheet for Healthcare Providers: seriousbroker.it  This test is not yet approved or cleared  by the [place]  FDA and has been authorized for detection and/or diagnosis of SARS-CoV-2 by FDA under an Emergency Use Authorization (EUA). This EUA will remain in effect (meaning this test can be used) for the duration of the COVID-19 declaration under Section 564(b)(1) of the Act, 21 U.S.C. section 360bbb-3(b)(1), unless the authorization is terminated or revoked.  Performed at [facility], [address], [place], [place] [address]   MRSA Next Gen by PCR, Nasal     Status: None   Collection Time: 07/31/23 11:13 PM   Specimen: Nasal Mucosa; Nasal Swab  Result Value Ref Range Status   MRSA by PCR Next Gen NOT DETECTED NOT DETECTED Final    Comment: (NOTE) The GeneXpert MRSA Assay (FDA approved for NASAL specimens only), is one component of a comprehensive MRSA colonization surveillance program. It is not intended to diagnose MRSA infection nor to guide or monitor treatment for MRSA infections. Test performance is not FDA approved in patients less than 39 years old. Performed at [facility], [address], [place], [place] [address]     Radiology Studies:  DG Chest 2 View Result Date: 08/01/2023 CLINICAL DATA:  Pneumothorax. EXAM: CHEST - 2 VIEW COMPARISON:  12/21/2019.  Chest CT 07/31/2023. FINDINGS: The cardio pericardial silhouette is enlarged. There is pulmonary vascular congestion without overt pulmonary edema. Interstitial markings are diffusely coarsened with chronic features. The trace anterior pneumothorax seen on CT scan yesterday is not evident by x-ray today. Multiple left-sided rib fractures evident, better characterized on yesterday's CT scan. Bones are diffusely demineralized. Telemetry leads overlie the chest. IMPRESSION: 1. The trace anterior pneumothorax seen on CT scan yesterday is not evident by x-ray today. 2. Multiple left-sided rib fractures, better characterized on yesterday's CT scan. Electronically Signed   By: Camellia Candle M.D.    On: 08/01/2023 07:38   CT Chest Wo Contrast Addendum Date: 07/31/2023 ADDENDUM REPORT: 07/31/2023 17:45 ADDENDUM: The original report was by Dr. Ryan Salvage. The following addendum is by Dr. Ryan Salvage: Critical Value/emergent results were called by telephone at the time of interpretation on 07/31/2023 at 5:34 pm to provider [facility] , who verbally acknowledged these results. Electronically Signed   By: Ryan Salvage M.D.   On: 07/31/2023 17:45   Result Date: 07/31/2023 CLINICAL DATA:  Chest trauma, clinically suspected left rib fractures. Fall, syncope. EXAM: CT CHEST WITHOUT CONTRAST TECHNIQUE: Multidetector CT imaging of the chest was performed following the standard protocol without IV contrast. RADIATION DOSE REDUCTION: This exam was performed according to the departmental dose-optimization program which includes automated exposure control, adjustment of the mA and/or kV according to patient size and/or use of iterative reconstruction technique. COMPARISON:  Chest radiograph 12/21/2019 and chest CT 07/12/2018 FINDINGS: Cardiovascular: Coronary, aortic arch, and branch vessel atherosclerotic vascular disease. Mild cardiomegaly. Mediastinum/Nodes: Unremarkable Lungs/Pleura: Biapical pleuroparenchymal scarring. Airway thickening noted with cylindrical bronchiectasis in the right middle lobe and airway plugging in the right middle lobe and both lower lobes. Clustered reticulonodular opacities in the right middle lobe posteriorly are increased from prior but still favor atypical infectious bronchiolitis based on morphology. Dependent atelectasis noted in both lower lobes. Airway plugging noted in the left upper lobe. There is a 2% or less left pneumothorax noted best seen anterior to the left pericardium. Trace left hemothorax. Upper Abdomen: Abdominal aortic atherosclerosis, including substantial atheromatous plaque at the origins of the celiac trunk and superior mesenteric artery.  Musculoskeletal: Acute segmental fractures of the left third, fourth, sixth, seventh, eighth, and tenth ribs noted. Additional acute fractures of the left ninth and eleventh ribs noted. Thoracic kyphosis. IMPRESSION: 1. Acute segmental fractures of the left third, fourth, sixth, seventh, eighth, and tenth ribs. Additional acute fractures of the left ninth and eleventh ribs. 2. There is a 2% or less left pneumothorax noted best seen anterior to the left pericardium. Trace left hemothorax. 3. Airway thickening with cylindrical bronchiectasis in the right middle lobe and airway plugging in the right middle lobe and both lower lobes. Clustered reticulonodular opacities in the right middle lobe posteriorly are increased from prior but still favor atypical infectious bronchiolitis based on morphology. 4. Dependent atelectasis in both lower lobes. 5. Mild cardiomegaly. 6. Coronary, aortic arch, and branch vessel atherosclerotic vascular disease. 7.  Aortic Atherosclerosis (ICD10-I70.0). Radiology assistant personnel have been notified to put me in telephone contact with the referring physician or the referring physician's clinical representative in order to discuss these findings. Once this communication is established I will issue an addendum to this report for documentation purposes. Electronically Signed: By: Ryan Salvage M.D. On: 07/31/2023 17:29   CT Head Wo Contrast Result Date: 07/31/2023 CLINICAL DATA:  Polytrauma, blunt EXAM:  CT HEAD WITHOUT CONTRAST CT CERVICAL SPINE WITHOUT CONTRAST TECHNIQUE: Multidetector CT imaging of the head and cervical spine was performed following the standard protocol without intravenous contrast. Multiplanar CT image reconstructions of the cervical spine were also generated. RADIATION DOSE REDUCTION: This exam was performed according to the departmental dose-optimization program which includes automated exposure control, adjustment of the mA and/or kV according to patient size  and/or use of iterative reconstruction technique. COMPARISON:  CT head 07/12/2018. FINDINGS: CT HEAD FINDINGS Brain: No evidence of acute infarction, hemorrhage, hydrocephalus, extra-axial collection or mass lesion/mass effect. Vascular: No hyperdense vessel. Skull: No acute fracture.  Small high left scalp contusion. Sinuses/Orbits: Clear sinuses.  No acute orbital findings. Other: No mastoid effusions. CT CERVICAL SPINE FINDINGS Alignment: Approximately 2 mm of anterolisthesis of C4 on C5, likely degenerative given severe facet arthropathy on the left at this level. Mild broad levocurvature. Skull base and vertebrae: Vertebral body heights are maintained. No evidence of acute fracture. Osteopenia. Soft tissues and spinal canal: No prevertebral fluid or swelling. No visible canal hematoma. Disc levels: Multilevel facet uncovertebral hypertrophy. Multilevel degenerative disc disease, greatest at C6-C7. Upper chest: Visualized lung apices are clear. IMPRESSION: 1. No evidence of acute abnormality intracranially or in the cervical spine. 2. Small high left scalp contusion. Electronically Signed   By: Gilmore GORMAN Molt M.D.   On: 07/31/2023 16:37   CT Cervical Spine Wo Contrast Result Date: 07/31/2023 CLINICAL DATA:  Polytrauma, blunt EXAM: CT HEAD WITHOUT CONTRAST CT CERVICAL SPINE WITHOUT CONTRAST TECHNIQUE: Multidetector CT imaging of the head and cervical spine was performed following the standard protocol without intravenous contrast. Multiplanar CT image reconstructions of the cervical spine were also generated. RADIATION DOSE REDUCTION: This exam was performed according to the departmental dose-optimization program which includes automated exposure control, adjustment of the mA and/or kV according to patient size and/or use of iterative reconstruction technique. COMPARISON:  CT head 07/12/2018. FINDINGS: CT HEAD FINDINGS Brain: No evidence of acute infarction, hemorrhage, hydrocephalus, extra-axial collection  or mass lesion/mass effect. Vascular: No hyperdense vessel. Skull: No acute fracture.  Small high left scalp contusion. Sinuses/Orbits: Clear sinuses.  No acute orbital findings. Other: No mastoid effusions. CT CERVICAL SPINE FINDINGS Alignment: Approximately 2 mm of anterolisthesis of C4 on C5, likely degenerative given severe facet arthropathy on the left at this level. Mild broad levocurvature. Skull base and vertebrae: Vertebral body heights are maintained. No evidence of acute fracture. Osteopenia. Soft tissues and spinal canal: No prevertebral fluid or swelling. No visible canal hematoma. Disc levels: Multilevel facet uncovertebral hypertrophy. Multilevel degenerative disc disease, greatest at C6-C7. Upper chest: Visualized lung apices are clear. IMPRESSION: 1. No evidence of acute abnormality intracranially or in the cervical spine. 2. Small high left scalp contusion. Electronically Signed   By: Gilmore GORMAN Molt M.D.   On: 07/31/2023 16:37   CT T-SPINE NO CHARGE Result Date: 07/31/2023 CLINICAL DATA:  Fall, syncopal episode EXAM: CT THORACIC SPINE WITHOUT CONTRAST TECHNIQUE: Multidetector CT images of the thoracic were obtained using the standard protocol without intravenous contrast. RADIATION DOSE REDUCTION: This exam was performed according to the departmental dose-optimization program which includes automated exposure control, adjustment of the mA and/or kV according to patient size and/or use of iterative reconstruction technique. COMPARISON:  07/27/2018 MRI thoracic spine, 07/12/2018 CT chest no prior dedicated CT of the thoracic spine FINDINGS: Alignment: Exaggeration of the normal thoracic kyphosis. Trace anterolisthesis of C7 on T1. No other significant listhesis. Vertebrae: No acute fracture or suspicious osseous lesion. Chronic mild  wedging of T7, T8, and T9. Paraspinal and other soft tissues: Redemonstrated left perineural cyst at T1-T2 on the left (series 15, image 30), which appears similar  to the 07/12/2018 CT and is better evaluated on the 07/27/2018 MRI. For additional findings in the thorax, including rib findings, please see same-day CT chest. Disc levels: No significant spinal canal stenosis or neural foraminal narrowing. IMPRESSION: No acute fracture or traumatic listhesis in the thoracic spine. Electronically Signed   By: Donald Campion M.D.   On: 07/31/2023 16:37    Scheduled Meds:    acetaminophen   1,000 mg Oral TID   amLODipine   2.5 mg Oral QHS   atorvastatin   10 mg Oral QHS   doxycycline   100 mg Oral Q12H   enoxaparin  (LOVENOX ) injection  40 mg Subcutaneous Q24H   ezetimibe   10 mg Oral Daily   gabapentin   100 mg Oral Daily   lidocaine   1 patch Transdermal Daily   pantoprazole   40 mg Oral Daily    Continuous Infusions:    sodium chloride  100 mL/hr at 08/01/23 0702   cefTRIAXone  (ROCEPHIN )  IV       LOS: 1 day     Trenda Mar, MD,  FACP, [facility], [facility], [facility]   [facility]      To contact the attending provider between 7A-7P or the covering provider during after hours 7P-7A, please log into the web site [email or web address] and access using universal [place] password for that web site. If you do not have the password, please call the hospital operator.  08/01/2023, 1:52 PM

## 2023-08-01 NOTE — Progress Notes (Signed)
  Echocardiogram 2D Echocardiogram has been performed.  Julie Petty 08/01/2023, 11:35 AM

## 2023-08-01 NOTE — Progress Notes (Signed)
 Physical Therapy Treatment Patient Details Name: Julie Petty MRN: [id number] DOB: 1932/06/11 Today's Date: 08/01/2023   History of Present Illness 89 y.o. female presents to [facility] 07/31/23 from ALF after feeling lightheaded and falling on L side. Pt with L 3-10 rib fxs with very small hematoma, PNA, influenza A. PMHx: HTN, HLD, GERD, lumbar radiculopathy, NSTEMI/CAD.    PT Comments  Pt in recliner upon arrival and agreeable to PT eval. Prior to admit, pt reports being ModI with RW for mobility. In today's session, pt required CGA to stand and ambulate ~30 ft with a RW. Pt desatted to 84% on 1L while ambulating. With rest and PLB, SpO2 rose to 95% on 1L. For [facility] transfer, pt was able to perform pericare, however, required assist to don underwear. Pt presents to therapy session below baseline with decreased strength, balance, and mobility. Pt would benefit from acute skilled PT to address functional impairments. Recommending post-acute rehab <3 hrs to work towards independence with mobility and prevent future falls. Acute PT to follow.      If plan is discharge home, recommend the following: A little help with walking and/or transfers;A little help with bathing/dressing/bathroom;Assist for transportation;Help with stairs or ramp for entrance;Assistance with cooking/housework   Can travel by private vehicle     Yes  Equipment Recommendations  None recommended by PT (pt owns equipment)       Precautions / Restrictions Precautions Precautions: Fall;Other (comment) Precaution Comments: watch O2 SATs, bladder incontinence Restrictions Weight Bearing Restrictions Per Provider Order: No     Mobility  Bed Mobility Overal bed mobility: Needs Assistance Bed Mobility: Supine to Sit     Supine to sit: Contact guard     General bed mobility comments: CGA for safety, pt began moving before cues were provided for log roll technique for rib fxs. Discussed sit/sidelying and rolling transfer with  pt    Transfers Overall transfer level: Needs assistance Equipment used: Rolling walker (2 wheels) Transfers: Sit to/from Stand Sit to Stand: Contact guard assist      General transfer comment: CGA for safety    Ambulation/Gait Ambulation/Gait assistance: Contact guard assist Gait Distance (Feet): 30 Feet Assistive device: Rolling walker (2 wheels) Gait Pattern/deviations: Step-through pattern, Shuffle, Trunk flexed, Narrow base of support Gait velocity: decr     General Gait Details: short and steady steps, desatted to 84% on 1L. With rest and PLB, rose to 95%. RN notified      Balance Overall balance assessment: Needs assistance, Mild deficits observed, not formally tested, History of Falls Sitting-balance support: No upper extremity supported, Feet supported Sitting balance-Leahy Scale: Good     Standing balance support: Bilateral upper extremity supported, During functional activity, Reliant on assistive device for balance Standing balance-Leahy Scale: Poor Standing balance comment: reliant on RW       Cognition Arousal: Alert Behavior During Therapy: WFL for tasks assessed/performed Overall Cognitive Status: Within Functional Limits for tasks assessed                Pertinent Vitals/Pain Pain Assessment Pain Assessment: Faces Faces Pain Scale: Hurts a little bit Pain Location: L rib fx areas Pain Descriptors / Indicators: Tender, Stabbing Pain Intervention(s): Limited activity within patient's tolerance, Monitored during session, Repositioned    Home Living Family/patient expects to be discharged to:: Assisted living  Home Equipment: Cane - single point;Grab bars - toilet;Grab bars - tub/shower;Rolling Walker (2 wheels) Additional Comments: pt lives at [facility] ILF/ALF  PT Goals (current goals can now be found in the care plan section) Acute Rehab PT Goals Patient Stated Goal: to work on balance PT Goal Formulation: With patient Time For  Goal Achievement: 08/15/23 Potential to Achieve Goals: Good    Frequency    Min 1X/week       AM-PAC PT 6 Clicks Mobility   Outcome Measure  Help needed turning from your back to your side while in a flat bed without using bedrails?: A Little Help needed moving from lying on your back to sitting on the side of a flat bed without using bedrails?: A Little Help needed moving to and from a bed to a chair (including a wheelchair)?: A Little Help needed standing up from a chair using your arms (e.g., wheelchair or bedside chair)?: A Little Help needed to walk in hospital room?: A Little Help needed climbing 3-5 steps with a railing? : A Lot 6 Click Score: 17    End of Session Equipment Utilized During Treatment: Gait belt;Oxygen Activity Tolerance: Patient tolerated treatment well Patient left: in bed;with call bell/phone within reach;with bed alarm set;with family/visitor present Nurse Communication: Mobility status;Other (comment) (O2 sats) PT Visit Diagnosis: Unsteadiness on feet (R26.81);History of falling (Z91.81);Muscle weakness (generalized) (M62.81)     Time: 8665-8596 PT Time Calculation (min) (ACUTE ONLY): 29 min  Charges:    $Therapeutic Activity: 8-22 mins PT General Charges $$ ACUTE PT VISIT: 1 Visit                    Kate ORN, PT, DPT Secure Chat Preferred  Rehab Office [phone]   Kate BRAVO Wendolyn 08/01/2023, 2:17 PM

## 2023-08-01 NOTE — Progress Notes (Signed)
 Assessment & Plan: HD#2 - fall with left rib fractures  - patient now at [facility], admitted to medical service  - multiple left rib fractures  - CXR this AM without evidence of pneumothorax or other complication  - IS ordered - use every hour while awake  - pain control appears adequate  Discussed with patient and her sister at the bedside.  Further management by TRH.  Trauma surgery will sign off.  Call if needed.        Julie Spinner, MD [facility] practice Office: [phone]        Chief Complaint: Fall  Subjective: Patient in bed, comfortable.  HOH.  Sister at bedside.  Objective: Vital signs in last 24 hours: Temp:  [97.6 F (36.4 C)-97.9 F (36.6 C)] 97.9 F (36.6 C) (02/08 0400) Pulse Rate:  [53-60] 53 (02/08 0400) Resp:  [10-20] 19 (02/08 0400) BP: (135-174)/(62-71) 146/65 (02/08 0400) SpO2:  [92 %-99 %] 96 % (02/08 0400) Weight:  [59.5 kg] 59.5 kg (02/07 2133) Last BM Date : 07/30/23  Intake/Output from previous day: 02/07 0701 - 02/08 0700 In: 1075.6 [P.O.:120; I.V.:955.6] Out: -  Intake/Output this shift: No intake/output data recorded.  Physical Exam: HEENT - sclerae clear, mucous membranes moist Neck - soft Chest - left chest wall tenderness; no crepitance; no ecchymosis; lidocaine  patch in place  Lab Results:  Recent Labs    07/31/23 1540 08/01/23 0410  WBC 9.4 4.3  HGB 15.0 12.4  HCT 46.0 37.4  PLT 188 160   BMET Recent Labs    07/31/23 1540 08/01/23 0410  NA 137 138  K 3.6 3.5  CL 101 106  CO2 26 24  GLUCOSE 129* 113*  BUN 27* 23  CREATININE 1.05* 0.94  CALCIUM  9.2 9.1   PT/INR No results for input(s): LABPROT, INR in the last 72 hours. Comprehensive Metabolic Panel:    Component Value Date/Time   NA 138 08/01/2023 0410   NA 137 07/31/2023 1540   K 3.5 08/01/2023 0410   K 3.6 07/31/2023 1540   CL 106 08/01/2023 0410   CL 101 07/31/2023 1540   CO2 24 08/01/2023 0410   CO2 26  07/31/2023 1540   BUN 23 08/01/2023 0410   BUN 27 (H) 07/31/2023 1540   CREATININE 0.94 08/01/2023 0410   CREATININE 1.05 (H) 07/31/2023 1540   GLUCOSE 113 (H) 08/01/2023 0410   GLUCOSE 129 (H) 07/31/2023 1540   CALCIUM  9.1 08/01/2023 0410   CALCIUM  9.2 07/31/2023 1540   AST 25 08/11/2018 1045   AST 27 07/30/2018 0938   ALT 23 08/11/2018 1045   ALT 25 07/30/2018 0938   ALKPHOS 44 08/11/2018 1045   ALKPHOS 55 07/30/2018 0938   BILITOT 1.1 08/11/2018 1045   BILITOT 0.4 07/30/2018 0938   BILITOT 0.9 04/24/2018 2058   PROT 6.9 08/11/2018 1045   PROT 6.9 07/30/2018 0938   PROT 6.8 04/24/2018 2058   ALBUMIN 3.8 08/11/2018 1045   ALBUMIN 4.3 07/30/2018 0938   ALBUMIN 3.7 04/24/2018 2058    Studies/Results: DG Chest 2 View Result Date: 08/01/2023 CLINICAL DATA:  Pneumothorax. EXAM: CHEST - 2 VIEW COMPARISON:  12/21/2019.  Chest CT 07/31/2023. FINDINGS: The cardio pericardial silhouette is enlarged. There is pulmonary vascular congestion without overt pulmonary edema. Interstitial markings are diffusely coarsened with chronic features. The trace anterior pneumothorax seen on CT scan yesterday is not evident by x-ray today. Multiple left-sided rib fractures evident, better characterized on yesterday's CT scan.  Bones are diffusely demineralized. Telemetry leads overlie the chest. IMPRESSION: 1. The trace anterior pneumothorax seen on CT scan yesterday is not evident by x-ray today. 2. Multiple left-sided rib fractures, better characterized on yesterday's CT scan. Electronically Signed   By: Camellia Candle M.D.   On: 08/01/2023 07:38   CT Chest Wo Contrast Addendum Date: 07/31/2023 ADDENDUM REPORT: 07/31/2023 17:45 ADDENDUM: The original report was by Dr. Ryan Salvage. The following addendum is by Dr. Ryan Salvage: Critical Value/emergent results were called by telephone at the time of interpretation on 07/31/2023 at 5:34 pm to provider MATTHEW [facility] , who verbally acknowledged these  results. Electronically Signed   By: Ryan Salvage M.D.   On: 07/31/2023 17:45   Result Date: 07/31/2023 CLINICAL DATA:  Chest trauma, clinically suspected left rib fractures. Fall, syncope. EXAM: CT CHEST WITHOUT CONTRAST TECHNIQUE: Multidetector CT imaging of the chest was performed following the standard protocol without IV contrast. RADIATION DOSE REDUCTION: This exam was performed according to the departmental dose-optimization program which includes automated exposure control, adjustment of the mA and/or kV according to patient size and/or use of iterative reconstruction technique. COMPARISON:  Chest radiograph 12/21/2019 and chest CT 07/12/2018 FINDINGS: Cardiovascular: Coronary, aortic arch, and branch vessel atherosclerotic vascular disease. Mild cardiomegaly. Mediastinum/Nodes: Unremarkable Lungs/Pleura: Biapical pleuroparenchymal scarring. Airway thickening noted with cylindrical bronchiectasis in the right middle lobe and airway plugging in the right middle lobe and both lower lobes. Clustered reticulonodular opacities in the right middle lobe posteriorly are increased from prior but still favor atypical infectious bronchiolitis based on morphology. Dependent atelectasis noted in both lower lobes. Airway plugging noted in the left upper lobe. There is a 2% or less left pneumothorax noted best seen anterior to the left pericardium. Trace left hemothorax. Upper Abdomen: Abdominal aortic atherosclerosis, including substantial atheromatous plaque at the origins of the celiac trunk and superior mesenteric artery. Musculoskeletal: Acute segmental fractures of the left third, fourth, sixth, seventh, eighth, and tenth ribs noted. Additional acute fractures of the left ninth and eleventh ribs noted. Thoracic kyphosis. IMPRESSION: 1. Acute segmental fractures of the left third, fourth, sixth, seventh, eighth, and tenth ribs. Additional acute fractures of the left ninth and eleventh ribs. 2. There is a 2% or  less left pneumothorax noted best seen anterior to the left pericardium. Trace left hemothorax. 3. Airway thickening with cylindrical bronchiectasis in the right middle lobe and airway plugging in the right middle lobe and both lower lobes. Clustered reticulonodular opacities in the right middle lobe posteriorly are increased from prior but still favor atypical infectious bronchiolitis based on morphology. 4. Dependent atelectasis in both lower lobes. 5. Mild cardiomegaly. 6. Coronary, aortic arch, and branch vessel atherosclerotic vascular disease. 7.  Aortic Atherosclerosis (ICD10-I70.0). Radiology assistant personnel have been notified to put me in telephone contact with the referring physician or the referring physician's clinical representative in order to discuss these findings. Once this communication is established I will issue an addendum to this report for documentation purposes. Electronically Signed: By: Ryan Salvage M.D. On: 07/31/2023 17:29   CT Head Wo Contrast Result Date: 07/31/2023 CLINICAL DATA:  Polytrauma, blunt EXAM: CT HEAD WITHOUT CONTRAST CT CERVICAL SPINE WITHOUT CONTRAST TECHNIQUE: Multidetector CT imaging of the head and cervical spine was performed following the standard protocol without intravenous contrast. Multiplanar CT image reconstructions of the cervical spine were also generated. RADIATION DOSE REDUCTION: This exam was performed according to the departmental dose-optimization program which includes automated exposure control, adjustment of the mA and/or kV  according to patient size and/or use of iterative reconstruction technique. COMPARISON:  CT head 07/12/2018. FINDINGS: CT HEAD FINDINGS Brain: No evidence of acute infarction, hemorrhage, hydrocephalus, extra-axial collection or mass lesion/mass effect. Vascular: No hyperdense vessel. Skull: No acute fracture.  Small high left scalp contusion. Sinuses/Orbits: Clear sinuses.  No acute orbital findings. Other: No mastoid  effusions. CT CERVICAL SPINE FINDINGS Alignment: Approximately 2 mm of anterolisthesis of C4 on C5, likely degenerative given severe facet arthropathy on the left at this level. Mild broad levocurvature. Skull base and vertebrae: Vertebral body heights are maintained. No evidence of acute fracture. Osteopenia. Soft tissues and spinal canal: No prevertebral fluid or swelling. No visible canal hematoma. Disc levels: Multilevel facet uncovertebral hypertrophy. Multilevel degenerative disc disease, greatest at C6-C7. Upper chest: Visualized lung apices are clear. IMPRESSION: 1. No evidence of acute abnormality intracranially or in the cervical spine. 2. Small high left scalp contusion. Electronically Signed   By: Gilmore GORMAN Molt M.D.   On: 07/31/2023 16:37   CT Cervical Spine Wo Contrast Result Date: 07/31/2023 CLINICAL DATA:  Polytrauma, blunt EXAM: CT HEAD WITHOUT CONTRAST CT CERVICAL SPINE WITHOUT CONTRAST TECHNIQUE: Multidetector CT imaging of the head and cervical spine was performed following the standard protocol without intravenous contrast. Multiplanar CT image reconstructions of the cervical spine were also generated. RADIATION DOSE REDUCTION: This exam was performed according to the departmental dose-optimization program which includes automated exposure control, adjustment of the mA and/or kV according to patient size and/or use of iterative reconstruction technique. COMPARISON:  CT head 07/12/2018. FINDINGS: CT HEAD FINDINGS Brain: No evidence of acute infarction, hemorrhage, hydrocephalus, extra-axial collection or mass lesion/mass effect. Vascular: No hyperdense vessel. Skull: No acute fracture.  Small high left scalp contusion. Sinuses/Orbits: Clear sinuses.  No acute orbital findings. Other: No mastoid effusions. CT CERVICAL SPINE FINDINGS Alignment: Approximately 2 mm of anterolisthesis of C4 on C5, likely degenerative given severe facet arthropathy on the left at this level. Mild broad  levocurvature. Skull base and vertebrae: Vertebral body heights are maintained. No evidence of acute fracture. Osteopenia. Soft tissues and spinal canal: No prevertebral fluid or swelling. No visible canal hematoma. Disc levels: Multilevel facet uncovertebral hypertrophy. Multilevel degenerative disc disease, greatest at C6-C7. Upper chest: Visualized lung apices are clear. IMPRESSION: 1. No evidence of acute abnormality intracranially or in the cervical spine. 2. Small high left scalp contusion. Electronically Signed   By: Gilmore GORMAN Molt M.D.   On: 07/31/2023 16:37   CT T-SPINE NO CHARGE Result Date: 07/31/2023 CLINICAL DATA:  Fall, syncopal episode EXAM: CT THORACIC SPINE WITHOUT CONTRAST TECHNIQUE: Multidetector CT images of the thoracic were obtained using the standard protocol without intravenous contrast. RADIATION DOSE REDUCTION: This exam was performed according to the departmental dose-optimization program which includes automated exposure control, adjustment of the mA and/or kV according to patient size and/or use of iterative reconstruction technique. COMPARISON:  07/27/2018 MRI thoracic spine, 07/12/2018 CT chest no prior dedicated CT of the thoracic spine FINDINGS: Alignment: Exaggeration of the normal thoracic kyphosis. Trace anterolisthesis of C7 on T1. No other significant listhesis. Vertebrae: No acute fracture or suspicious osseous lesion. Chronic mild wedging of T7, T8, and T9. 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 For additional findings in the thorax, including rib findings, please see same-day CT chest. Disc levels: No significant spinal canal stenosis or neural foraminal  narrowing. IMPRESSION: No acute fracture or traumatic listhesis in the thoracic spine. Electronically Signed   By: Donald Campion M.D.   On: 07/31/2023 16:37       Julie Petty 08/01/2023  Patient ID: Julie Petty, female   DOB: 21-May-1932, 89 y.o.   MRN: [id number]

## 2023-08-01 NOTE — Evaluation (Signed)
 Occupational Therapy Evaluation Patient Details Name: Julie Petty MRN: [id number] DOB: 1932/01/06 Today's Date: 08/01/2023   History of Present Illness Julie Petty is a 89 y.o. female with medical history significant for HTN, HLD, GERD, lumbar radiculopathy, and NSTEMI/CAD who presents from her assisted living facility to the ED for evaluation after a fall.  Patient reports she was diagnosed with the flu 3 to 4 days ago due to persistent cough, fatigue, runny nose. She was given cough and cold medications that she has been taking but her symptoms did not improve and she has been persistently weak over the last few days. While she was getting some water at her kitchen sink, she felt lightheaded before and she fell hitting her left side and head on the floor.   Clinical Impression   Pt presents with decline in function and safety with ADLs and ADL mobility with impaired strength, balance and endruance. PTA pt loved at an ALF/ILF and reports that she was Ind with ADLs/selfcare, was driving and used a cane for mobility. Pt currenlty requires mod A with LB ADLs, min A with mobility/transfers. Pt on 2L O2 at 96% at rest, O2 SATs dropping to 84% on RA after toileting activity. Pt instructed on deep, pursed lip breathing to recover to 88-89% and placed back on 2L O2, RN notified. Pt's sister present and very supportive. Pt would benefit from acute OT services to address impairments to maximize level of function and safety.         If plan is discharge home, recommend the following: A lot of help with bathing/dressing/bathroom;A little help with walking and/or transfers;Assist for transportation    Functional Status Assessment  Patient has had a recent decline in their functional status and demonstrates the ability to make significant improvements in function in a reasonable and predictable amount of time.  Equipment Recommendations  Tub/shower seat;Other (comment) (RW)    Recommendations  for Other Services       Precautions / Restrictions Precautions Precautions: Fall;Other (comment) Precaution Comments: watch O2 SATs Restrictions Weight Bearing Restrictions Per Provider Order: No      Mobility Bed Mobility Overal bed mobility: Needs Assistance Bed Mobility: Rolling, Sidelying to Sit Rolling: Min assist Sidelying to sit: Min assist            Transfers Overall transfer level: Needs assistance Equipment used: 1 person hand held assist, Rolling walker (2 wheels) Transfers: Sit to/from Stand, Bed to chair/wheelchair/BSC Sit to Stand: Min assist Stand pivot transfers: Min assist         General transfer comment: O2 SATs dropping to 84% on RA after toileting activity      Balance Overall balance assessment: Needs assistance Sitting-balance support: No upper extremity supported, Feet supported Sitting balance-Leahy Scale: Fair     Standing balance support: Bilateral upper extremity supported, During functional activity Standing balance-Leahy Scale: Poor                             ADL either performed or assessed with clinical judgement   ADL Overall ADL's : Needs assistance/impaired Eating/Feeding: Set up;Independent;Sitting   Grooming: Wash/dry hands;Wash/dry face;Contact guard assist;Sitting   Upper Body Bathing: Minimal assistance   Lower Body Bathing: Moderate assistance   Upper Body Dressing : Minimal assistance   Lower Body Dressing: Moderate assistance   Toilet Transfer: Minimal assistance;Stand-pivot;BSC/3in1   Toileting- Clothing Manipulation and Hygiene: Sit to/from stand;Moderate assistance  Functional mobility during ADLs: Minimal assistance General ADL Comments: O2 SATs dropping to 84% on RA after toileting activity. Pt instructed on deep, pursed lip breathing to recover to 88-89% with 2L O2 placed back on     Vision Baseline Vision/History: 1 Wears glasses Ability to See in Adequate Light: 0  Adequate Patient Visual Report: No change from baseline       Perception         Praxis         Pertinent Vitals/Pain Pain Assessment Pain Assessment: Faces Faces Pain Scale: Hurts a little bit Pain Location: L rib fx areas Pain Descriptors / Indicators: Tender, Stabbing Pain Intervention(s): Monitored during session, Premedicated before session, Repositioned     Extremity/Trunk Assessment Upper Extremity Assessment Upper Extremity Assessment: Generalized weakness   Lower Extremity Assessment Lower Extremity Assessment: Defer to PT evaluation       Communication     Cognition Arousal: Alert Behavior During Therapy: WFL for tasks assessed/performed Overall Cognitive Status: Within Functional Limits for tasks assessed                                       General Comments       Exercises     Shoulder Instructions      Home Living Family/patient expects to be discharged to:: Assisted living                             Home Equipment: Cane - single point;Grab bars - toilet;Grab bars - tub/shower   Additional Comments: pt lives at [facility] IL[facility]ALF      Prior Functioning/Environment Prior Level of Function : Independent/Modified Independent;Driving                        OT Problem List: Decreased strength;Impaired balance (sitting and/or standing);Pain;Decreased activity tolerance;Decreased coordination;Decreased knowledge of use of DME or AE      OT Treatment/Interventions: Self-care/ADL training;DME and/or AE instruction;Therapeutic activities;Balance training;Patient/family education    OT Goals(Current goals can be found in the care plan section) Acute Rehab OT Goals Patient Stated Goal: get well OT Goal Formulation: With patient/family Time For Goal Achievement: 08/15/23 Potential to Achieve Goals: Good  OT Frequency: Min 1X/week    Co-evaluation              AM-PAC OT 6 Clicks Daily Activity      Outcome Measure Help from another person eating meals?: None Help from another person taking care of personal grooming?: A Little Help from another person toileting, which includes using toliet, bedpan, or urinal?: A Lot Help from another person bathing (including washing, rinsing, drying)?: A Lot Help from another person to put on and taking off regular upper body clothing?: A Little Help from another person to put on and taking off regular lower body clothing?: A Lot 6 Click Score: 16   End of Session Equipment Utilized During Treatment: Gait belt;Rolling walker (2 wheels);Oxygen;Other (comment) [facility]) Nurse Communication: Mobility status  Activity Tolerance: Patient limited by fatigue Patient left: in chair;with call bell/phone within reach;with family/visitor present  OT Visit Diagnosis: Unsteadiness on feet (R26.81);Other abnormalities of gait and mobility (R26.89);History of falling (Z91.81);Muscle weakness (generalized) (M62.81);Pain Pain - Right/Left: Left Pain - part of body:  (rib fx areas)  Time: 8779-8745 OT Time Calculation (min): 34 min Charges:  OT General Charges $OT Visit: 1 Visit OT Evaluation $OT Eval Moderate Complexity: 1 Mod OT Treatments $Self Care/Home Management : 8-22 mins $Therapeutic Activity: 8-22 mins    Jacques Karna Loose 08/01/2023, 1:26 PM

## 2023-08-02 DIAGNOSIS — S2242XA Multiple fractures of ribs, left side, initial encounter for closed fracture: Secondary | ICD-10-CM

## 2023-08-02 DIAGNOSIS — R55 Syncope and collapse: Secondary | ICD-10-CM | POA: Diagnosis not present

## 2023-08-02 DIAGNOSIS — J101 Influenza due to other identified influenza virus with other respiratory manifestations: Secondary | ICD-10-CM | POA: Diagnosis not present

## 2023-08-02 MED ORDER — KETOROLAC TROMETHAMINE 30 MG/ML IJ SOLN
30.0000 mg | Freq: Three times a day (TID) | INTRAMUSCULAR | Status: DC | PRN
  Administered 2023-08-02 – 2023-08-03 (×2): 30 mg via INTRAVENOUS
  Filled 2023-08-02 (×2): qty 1

## 2023-08-02 NOTE — Progress Notes (Signed)
 Mobility Specialist Progress Note:   08/02/23 1445  Mobility  Activity Ambulated with assistance in hallway  Level of Assistance Contact guard assist, steadying assist  Assistive Device Front wheel walker  Distance Ambulated (ft) 100 ft  Activity Response Tolerated well  Mobility Referral Yes  Mobility visit 1 Mobility  Mobility Specialist Start Time (ACUTE ONLY) 1445  Mobility Specialist Stop Time (ACUTE ONLY) 1505  Mobility Specialist Time Calculation (min) (ACUTE ONLY) 20 min   Pt agreeable to mobility session. Required 1LO2 to maintain SpO2 >89%. Attempted RA, Pt desat to 86%, with noted SOB. Pt back in chair with all needs met, family present.   Therisa Rana Mobility Specialist Please contact via SecureChat or  Rehab office at [phone]

## 2023-08-02 NOTE — Plan of Care (Signed)

## 2023-08-02 NOTE — TOC Initial Note (Signed)
 Transition of Care [facility]) - Initial/Assessment Note    Patient Details  Name: Julie Petty MRN: [id number] Date of Birth: 1931/07/24  [facility]) CM/SW Contact:    Velvia Mehrer A Erisha Paugh, LCSW Phone Number: 08/02/2023, 12:40 PM  Clinical Narrative:                  CSW spoke with pt's daughter, Julie Petty, who stated that pt is in agreement for short term rehab at at [facility].  Pt is from [facility] and CSW informed her that they had a SNF side to their facility. They stated they wanted to go there for rehab. SNF workup completed.  CSW to reach out to [facility] to inform them of pt needing rehab at discharge. SNF workup completed, bed offer to [facility] pending.    TOC will continue to follow.   Expected Discharge Plan: Skilled Nursing Facility Barriers to Discharge: Continued Medical Work up, English As A Second Language Teacher, SNF Pending bed offer   Patient Goals and CMS Choice            Expected Discharge Plan and Services       Living arrangements for the past 2 months: Assisted Living Facility                                      Prior Living Arrangements/Services Living arrangements for the past 2 months: Assisted Living Facility Lives with:: Facility Resident                   Activities of Daily Living   ADL Screening (condition at time of admission) Independently performs ADLs?: Yes (appropriate for developmental age) Is the patient deaf or have difficulty hearing?: Yes Does the patient have difficulty seeing, even when wearing glasses/contacts?: No Does the patient have difficulty concentrating, remembering, or making decisions?: No  Permission Sought/Granted                  Emotional Assessment Appearance:: Well-Groomed Attitude/Demeanor/Rapport: Unable to Assess Affect (typically observed): Unable to Assess Orientation: : Oriented to Self, Oriented to Place, Oriented to  Time, Oriented to Situation Alcohol  /  Substance Use: Not Applicable Psych Involvement: No (comment)  Admission diagnosis:  Influenza A [J10.1] Fall [W19.XXXA] Traumatic pneumothorax, sequela [S27.0XXS] Closed fracture of multiple ribs of left side, initial encounter [S22.42XA] Patient Active Problem List   Diagnosis Date Noted   Fall 07/31/2023   Multiple closed fractures of ribs of left side 07/31/2023   Influenza A 07/31/2023   Pneumothorax, traumatic 07/31/2023   Community acquired pneumonia of right middle lobe of lung 07/31/2023   Radiculopathy, lumbar region 12/17/2021   Lung nodule 02/03/2020   Coronary artery disease involving native coronary artery of native heart without angina pectoris 08/14/2018   Cardiomyopathy [facility])    NSTEMI (non-ST elevated myocardial infarction) (HCC) 04/24/2018   Essential hypertension 04/24/2018   Hyperlipidemia with target LDL less than 70 04/24/2018   Inguinal hernia 01/14/2011   PCP:  Jacques Camie Pepper, PA-C Pharmacy:   [facility] [address] [place] - [address] [place] [address] Phone: [phone] Fax: [phone]  [facility] - [place], [place] - [address] [place] [address] Phone: [phone] Fax: [phone]     Social Drivers of Health (SDOH) Social History: SDOH Screenings   Food Insecurity: No  Food Insecurity (07/31/2023)  Housing: Low Risk  (07/31/2023)  Transportation Needs: No Transportation Needs (07/31/2023)  Utilities: Not At Risk (07/31/2023)  Financial Resource Strain: Low Risk  (07/14/2023)   Received from [facility]  Physical Activity: Insufficiently Active (10/09/2022)   Received from [facility], [facility]  Social Connections: Moderately Integrated (07/31/2023)  Stress: No Stress Concern Present (02/15/2023)   Received from [facility]  Tobacco Use: Low Risk  (07/31/2023)   SDOH Interventions: Food Insecurity Interventions:  Intervention Not Indicated Housing Interventions: Intervention Not Indicated Transportation Interventions: Intervention Not Indicated Utilities Interventions: Intervention Not Indicated Social Connections Interventions: Intervention Not Indicated   Readmission Risk Interventions     No data to display

## 2023-08-02 NOTE — [place] FL2 (Signed)
 [place]  MEDICAID FL2 LEVEL OF CARE FORM     IDENTIFICATION  Patient Name: Julie Petty Birthdate: 1931-09-05 Sex: female Admission Date (Current Location): 07/31/2023  [place] and [place] Number:  [place] [id number] Facility and Address:  [facility], [address] [place], [place] [address]      Provider Number: [id number]  Attending Physician Name and Address:  Judeth Trenda BIRCH, MD  Relative Name and Phone Number:       Current Level of Care: Hospital Recommended Level of Care: Skilled Nursing Facility Prior Approval Number:    Date Approved/Denied:   PASRR Number: [id number]  Discharge Plan: SNF    Current Diagnoses: Patient Active Problem List   Diagnosis Date Noted   Fall 07/31/2023   Multiple closed fractures of ribs of left side 07/31/2023   Influenza A 07/31/2023   Pneumothorax, traumatic 07/31/2023   Community acquired pneumonia of right middle lobe of lung 07/31/2023   Radiculopathy, lumbar region 12/17/2021   Lung nodule 02/03/2020   Coronary artery disease involving native coronary artery of native heart without angina pectoris 08/14/2018   Cardiomyopathy [facility])    NSTEMI (non-ST elevated myocardial infarction) (HCC) 04/24/2018   Essential hypertension 04/24/2018   Hyperlipidemia with target LDL less than 70 04/24/2018   Inguinal hernia 01/14/2011    Orientation RESPIRATION BLADDER Height & Weight     Self, Time, Situation, Place  Normal External catheter Weight: 131 lb 2.8 oz (59.5 kg) Height:  5' 2.99 (160 cm)  BEHAVIORAL SYMPTOMS/MOOD NEUROLOGICAL BOWEL NUTRITION STATUS      Continent Diet (see DC summary)  AMBULATORY STATUS COMMUNICATION OF NEEDS Skin   Extensive Assist Verbally Other (Comment), Surgical wounds (- 4 Ports Abdomen 1: Umbilicus 2: Medial;Upper 3: Right;Medial 4: Right;Lateral. Incision (Closed) 08/17/18 Abdomen Other (Comment))                       Personal Care Assistance Level of  Assistance  Bathing, Feeding, Dressing Bathing Assistance: Maximum assistance Feeding assistance: Limited assistance Dressing Assistance: Maximum assistance     Functional Limitations Info  Sight, Hearing, Speech Sight Info: Adequate Hearing Info: Impaired (hearing aids, R and L ears) Speech Info: Adequate    SPECIAL CARE FACTORS FREQUENCY  OT (By licensed OT), PT (By licensed PT)     PT Frequency: 5x/week OT Frequency: 5x/week            Contractures Contractures Info: Not present    Additional Factors Info  Code Status, Allergies Code Status Info: DNR Allergies Info: Dicyclomine  Morphine  Sulfate  Mupirocin  Statins  Losartan  Potassium  Morphine  And Codeine  Sulfa Antibiotics  Butalbital-asa-caff-codeine  Codeine  Elemental Sulfur  Losartan            Current Medications (08/02/2023):  This is the current hospital active medication list Current Facility-Administered Medications  Medication Dose Route Frequency Provider Last Rate Last Admin   acetaminophen  (TYLENOL ) tablet 1,000 mg  1,000 mg Oral TID Amponsah, Prosper M, MD   1,000 mg at 08/02/23 9166   amLODipine  (NORVASC ) tablet 2.5 mg  2.5 mg Oral QHS Amponsah, Prosper M, MD   2.5 mg at 08/01/23 2140   atorvastatin  (LIPITOR) tablet 10 mg  10 mg Oral QHS Amponsah, Prosper M, MD   10 mg at 08/01/23 2140   brinzolamide  (AZOPT ) 1 % ophthalmic suspension 1 drop  1 drop Both Eyes BID Hongalgi, Anand D, MD   1 drop at 08/02/23 9161   cephALEXin  (  KEFLEX ) capsule 250 mg  250 mg Oral QHS Hongalgi, Anand D, MD   250 mg at 08/01/23 2303   cholecalciferol  (VITAMIN D3) 25 MCG (1000 UNIT) tablet 5,000 Units  5,000 Units Oral Daily Hongalgi, Anand D, MD   5,000 Units at 08/02/23 9166   enoxaparin  (LOVENOX ) injection 40 mg  40 mg Subcutaneous Q24H Amponsah, Prosper M, MD   40 mg at 08/01/23 2139   ezetimibe  (ZETIA ) tablet 10 mg  10 mg Oral Daily Amponsah, Prosper M, MD   10 mg at 08/01/23 2140   gabapentin  (NEURONTIN ) capsule 100 mg  100  mg Oral Daily Lou Claretta HERO, MD   100 mg at 08/01/23 2140   ketorolac  (TORADOL ) 30 MG/ML injection 30 mg  30 mg Intravenous Q8H PRN Hongalgi, Anand D, MD   30 mg at 08/02/23 1046   latanoprost  (XALATAN ) 0.005 % ophthalmic solution 1 drop  1 drop Right Eye QHS Hongalgi, Anand D, MD   1 drop at 08/01/23 2149   lidocaine  (LIDODERM ) 5 % 1 patch  1 patch Transdermal Daily Lou Claretta HERO, MD   1 patch at 08/02/23 9166   ondansetron  (ZOFRAN ) tablet 4 mg  4 mg Oral Q6H PRN Amponsah, Prosper M, MD       Or   ondansetron  (ZOFRAN ) injection 4 mg  4 mg Intravenous Q6H PRN Amponsah, Prosper M, MD   4 mg at 08/01/23 1114   pantoprazole  (PROTONIX ) EC tablet 40 mg  40 mg Oral Daily Amponsah, Prosper M, MD   40 mg at 08/02/23 9166   polyvinyl alcohol  (LIQUIFILM TEARS) 1.4 % ophthalmic solution 1 drop  1 drop Both Eyes PRN Hongalgi, Anand D, MD       senna-docusate (Senokot-S) tablet 1 tablet  1 tablet Oral QHS PRN Amponsah, Prosper M, MD         Discharge Medications: Please see discharge summary for a list of discharge medications.  Relevant Imaging Results:  Relevant Lab Results:   Additional Information SSN: [id number]  Saliyah Gillin A Tynika Luddy, LCSW

## 2023-08-02 NOTE — TOC CAGE-AID Note (Signed)
 Transition of Care [facility]) - CAGE-AID Screening   Patient Details  Name: Julie Petty MRN: [id number] Date of Birth: [birth year]  [facility]) CM/SW Contact:    LEBRON ROCKIE LELON, RN Phone Number: [phone] 08/02/2023, 6:39 PM   Clinical Narrative: Pt presently in the hospital due to having a syncopal episode and falling, striking her head, left forearm and left side.  Pt denies alcohol  or drug use.  Screening complete.   CAGE-AID Screening:    Have You Ever Felt You Ought to Cut Down on Your Drinking or Drug Use?: No Have People Annoyed You By Critizing Your Drinking Or Drug Use?: No Have You Felt Bad Or Guilty About Your Drinking Or Drug Use?: No Have You Ever Had a Drink or Used Drugs First Thing In The Morning to Steady Your Nerves or to Get Rid of a Hangover?: No CAGE-AID Score: 0  Substance Abuse Education Offered: No

## 2023-08-02 NOTE — Progress Notes (Signed)
 Nurse requested Mobility Specialist to perform oxygen saturation test with pt which includes removing pt from oxygen both at rest and while ambulating.  Below are the results from that testing.     Patient Saturations on Room Air at Rest = spO2 97%  Patient Saturations on Room Air while Ambulating = sp02 86% .  Rested and performed pursed lip breathing for 1 minute with sp02 at 87%.  Patient Saturations on 1 Liters of oxygen while Ambulating = sp02 92%  At end of testing pt left in room on 1  Liters of oxygen.  Reported results to nurse.   Therisa Rana Mobility Specialist Please contact via SecureChat or  Rehab office at [phone]

## 2023-08-02 NOTE — Progress Notes (Signed)
 PROGRESS NOTE   Julie Petty  FMW:[id number]    DOB: 04/24/1932    DOA: 07/31/2023  PCP: Jacques Camie Pepper, PA-C   I have briefly reviewed patients previous medical records in [facility].  Chief Complaint  Patient presents with   Fall   Emesis   Loss of Consciousness    Brief Hospital Course:  89 year old female, lives alone at an independent living facility, independent and still drives, medical history significant for HTN, HLD, GERD, mild CAD by cardiac cath in 2019, Takotsubo syndrome with complete recovery, recurrent UTIs on Keflex , presented to the [facility] ED via EMS following an episode of syncope, fall, head and left-sided chest pain.  She was seen at an urgent care on 2/4 for URTI symptoms, diagnosed with possible viral illness, reports that her symptoms did not get better with the 2 medications she was given, was not given Tamiflu).  She had ongoing persistent cough, fatigue, runny nose with progressive weakness for 3 to 4 days PTA.  On day of admission, while attempting to get water from her kitchen sink, felt lightheaded, passed out, fell hitting her head and left forearm and left side of her chest.  She states that she may have passed out for about 2 minutes, then was able to call for assistance.  Admitted for confirmed influenza A, several (about 8) left-sided rib fractures, tiny left pneumothorax (resolved on follow-up chest x-ray).  Seen by general surgery at [facility] who recommended transfer to [facility] for evaluation by their trauma colleagues.  DC to SNF pending adequate pain control and bed availability.   Assessment & Plan:  Principal Problem:   Fall Active Problems:   Multiple closed fractures of ribs of left side   Influenza A   Pneumothorax, traumatic   Community acquired pneumonia of right middle lobe of lung   Influenza A with acute bronchitis Afebrile and hemodynamically stable since admission Appears to be off of oxygen currently. Supportive  treatment and monitor. Out of window for initiation of Tamiflu. Procalcitonin negative, discontinued antibiotics. Consider repeating CT chest without contrast in 4 weeks to ensure resolution of abnormal findings or further evaluation as deemed necessary.  Syncope with collapse Suspected due to progressive influenza A symptoms, poor oral intake and dehydration Briefly hydrated with IV fluids. Telemetry shows stable sinus bradycardia mostly in the 50s and very occasionally in the mid 40s without arrhythmias or pauses.   Check orthostatic vital signs-reordered. 2D echo 2/8: LVEF 60-65% grade 1 diastolic dysfunction.  No aortic stenosis 2/8, patient was counseled regarding no driving x 6 months in the presence of her sister.  She verbalized understanding.  Multiple (8) left rib fractures/tiny left pneumothorax Sustained post fall Initial CT had shown tiny left pneumothorax. Acute fracture of left 3, 4, 6, 7, 8, 9, 10 and 11th rib. Follow-up chest x-ray 2/8 without evidence of pneumothorax. Trauma MD follow-up appreciated, they signed off.  Agree with incentive spirometry. Multimodality pain control (scheduled Tylenol , lidocaine  patch).  Patient and her children at bedside decline any form of opioids at this time.  Advised them that NSAIDs can only be used briefly because they also carry risks of GI issues and kidney issues.  They wanted to try brief course of IV Toradol .  Could consider increasing lidocaine  patch as well. PT and OT recommended SNF, TOC on board and working on SNF placement.  Fall at home Details as above CT head, C-spine: No acute bony abnormalities reported. Small high left scalp  contusion CT chest with abnormal findings i.e. questionable bronchiectasis reported but procalcitonin negative.?  Chronic versus viral.  No antibiotics indicated.  Essential hypertension/sinus bradycardia: Check orthostatics Continuing amlodipine  2.5 Mg daily. Toprol -XL held on admission due to  bradycardia (TSH 0.712).  This may have to be held even on discharge.  Hyperlipidemia Continue atorvastatin  and Zetia   GERD Continue PPI  Vitamin D  insufficiency Vitamin D  25-hydroxy: 32.26.  Continue vitamin D  supplements.  Glaucoma Continue PTA meds  History of recurrent UTI Continue bedtime Keflex . UA not indicative of UTI.  Urinary incontinence Per family, recently started a week ago since the diagnosis of flu, even prior to the fall.  Etiology unclear.  Will check bladder scan to make sure that this is not stress incontinence.  Hesitant to start any meds to avoid precipitating confusion  Body mass index is 23.24 kg/m.   DVT prophylaxis: enoxaparin  (LOVENOX ) injection 40 mg Start: 07/31/23 2200     Code Status: Limited: Do not attempt resuscitation (DNR) -DNR-LIMITED -Do Not Intubate/DNI :  Family Communication: Patient's son and daughter at bedside, updated care and answered all questions. Disposition:  Pending pain control, to SNF pending bed availability and insurance clearance     Consultants:   General Surgery/trauma  Procedures:   None  Antimicrobials:   Discontinued ceftriaxone  and doxycycline .   Subjective:  Patient stated that overall she feels poorly.  Sick in the stomach but no nausea or vomiting.  Urinary incontinence x 2 since this morning.  [birth year].  Per family, has been using the incentive spirometer.  Objective:   Vitals:   08/01/23 1530 08/01/23 1945 08/02/23 0433 08/02/23 0910  BP: 130/74 139/65 (!) 147/61 (!) 146/65  Pulse: 72 (!) 52 (!) 53 (!) 58  Resp: 18 16 16 18   Temp: 97.6 F (36.4 C) 98 F (36.7 C) 98.1 F (36.7 C) (!) 97.5 F (36.4 C)  TempSrc: Oral Oral Oral Oral  SpO2: 93% 93% 93% 97%  Weight:      Height:        General exam: Elderly female, moderately built and nourished lying comfortably propped up in bed without distress.  Did not appear in pain. Scalp: Unable to clearly identify the contusion  reported on imaging. Respiratory system: Clear to auscultation.  No increased work of breathing. Cardiovascular system: S1 & S2 heard, RRR. No JVD, murmurs, rubs, gallops or clicks. No pedal edema.  Telemetry personally reviewed: SB mostly in the 71s.  Occasionally 44/min. Gastrointestinal system: Abdomen is nondistended, soft and nontender. No organomegaly or masses felt. Normal bowel sounds heard. Central nervous system: Alert and oriented. No focal neurological deficits.  Hard of hearing. Extremities: Symmetric 5 x 5 power.  Small dressing near the left elbow, small blister underneath per patient/sisters report. Skin: No rashes, lesions or ulcers Psychiatry: Judgement and insight appear normal. Mood & affect appropriate.     Data Reviewed:   I have personally reviewed following labs and imaging studies   CBC: Recent Labs  Lab 07/31/23 1540 08/01/23 0410  WBC 9.4 4.3  HGB 15.0 12.4  HCT 46.0 37.4  MCV 101.5* 99.7  PLT 188 160    Basic Metabolic Panel: Recent Labs  Lab 07/31/23 1540 08/01/23 0410  NA 137 138  K 3.6 3.5  CL 101 106  CO2 26 24  GLUCOSE 129* 113*  BUN 27* 23  CREATININE 1.05* 0.94  CALCIUM  9.2 9.1  MG 2.1  --     Liver Function Tests: No  results for input(s): AST, ALT, ALKPHOS, BILITOT, PROT, ALBUMIN in the last 168 hours.  CBG: No results for input(s): GLUCAP in the last 168 hours.  Microbiology Studies:   Recent Results (from the past 240 hours)  Resp panel by RT-PCR (RSV, Flu A&B, Covid) Anterior Nasal Swab     Status: Abnormal   Collection Time: 07/31/23  4:03 PM   Specimen: Anterior Nasal Swab  Result Value Ref Range Status   SARS Coronavirus 2 by RT PCR NEGATIVE NEGATIVE Final    Comment: (NOTE) SARS-CoV-2 target nucleic acids are NOT DETECTED.  The SARS-CoV-2 RNA is generally detectable in upper respiratory specimens during the acute phase of infection. The lowest concentration of SARS-CoV-2 viral copies this assay can  detect is 138 copies/mL. A negative result does not preclude SARS-Cov-2 infection and should not be used as the sole basis for treatment or other patient management decisions. A negative result may occur with  improper specimen collection/handling, submission of specimen other than nasopharyngeal swab, presence of viral mutation(s) within the areas targeted by this assay, and inadequate number of viral copies(<138 copies/mL). A negative result must be combined with clinical observations, patient history, and epidemiological information. The expected result is Negative.  Fact Sheet for Patients:  bloggercourse.com  Fact Sheet for Healthcare Providers:  seriousbroker.it  This test is no t yet approved or cleared by the [place]  FDA and  has been authorized for detection and/or diagnosis of SARS-CoV-2 by FDA under an Emergency Use Authorization (EUA). This EUA will remain  in effect (meaning this test can be used) for the duration of the COVID-19 declaration under Section 564(b)(1) of the Act, 21 U.S.C.section 360bbb-3(b)(1), unless the authorization is terminated  or revoked sooner.       Influenza A by PCR POSITIVE (A) NEGATIVE Final   Influenza B by PCR NEGATIVE NEGATIVE Final    Comment: (NOTE) The Xpert Xpress SARS-CoV-2/FLU/RSV plus assay is intended as an aid in the diagnosis of influenza from Nasopharyngeal swab specimens and should not be used as a sole basis for treatment. Nasal washings and aspirates are unacceptable for Xpert Xpress SARS-CoV-2/FLU/RSV testing.  Fact Sheet for Patients: bloggercourse.com  Fact Sheet for Healthcare Providers: seriousbroker.it  This test is not yet approved or cleared by the [place]  FDA and has been authorized for detection and/or diagnosis of SARS-CoV-2 by FDA under an Emergency Use Authorization (EUA). This EUA will  remain in effect (meaning this test can be used) for the duration of the COVID-19 declaration under Section 564(b)(1) of the Act, 21 U.S.C. section 360bbb-3(b)(1), unless the authorization is terminated or revoked.     Resp Syncytial Virus by PCR NEGATIVE NEGATIVE Final    Comment: (NOTE) Fact Sheet for Patients: bloggercourse.com  Fact Sheet for Healthcare Providers: seriousbroker.it  This test is not yet approved or cleared by the [place]  FDA and has been authorized for detection and/or diagnosis of SARS-CoV-2 by FDA under an Emergency Use Authorization (EUA). This EUA will remain in effect (meaning this test can be used) for the duration of the COVID-19 declaration under Section 564(b)(1) of the Act, 21 U.S.C. section 360bbb-3(b)(1), unless the authorization is terminated or revoked.  Performed at [facility], [address] [place], [place] [address]   MRSA Next Gen by PCR, Nasal     Status: None   Collection Time: 07/31/23 11:13 PM   Specimen: Nasal Mucosa; Nasal Swab  Result Value Ref Range Status   MRSA by PCR Next Gen  NOT DETECTED NOT DETECTED Final    Comment: (NOTE) The GeneXpert MRSA Assay (FDA approved for NASAL specimens only), is one component of a comprehensive MRSA colonization surveillance program. It is not intended to diagnose MRSA infection nor to guide or monitor treatment for MRSA infections. Test performance is not FDA approved in patients less than 77 years old. Performed at [facility], [address] [place], [place] [address]     Radiology Studies:  ECHOCARDIOGRAM COMPLETE Result Date: 08/01/2023    ECHOCARDIOGRAM REPORT   Patient Name:   Julie Petty Date of Exam: 08/01/2023 Medical Rec #:  [id number]         Height:       63.0 in Accession #:    [id number]        Weight:       131.2 lb Date of Birth:  [birth year]         BSA:          1.616 m Patient Age:    89  years          BP:           149/71 mmHg Patient Gender: F                 HR:           56 bpm. Exam Location:  Inpatient Procedure: 2D Echo, Cardiac Doppler and Color Doppler Indications:    R55 Syncope  History:        Patient has prior history of Echocardiogram examinations, most                 recent 04/25/2018. Cardiomyopathy, Previous Myocardial Infarction                 and CAD; Risk Factors:Hypertension and Dyslipidemia. FLU                 positive. Pneumothorax.  Sonographer:    Ellouise Mose RDCS Referring Phys: [phone] Carlee Vonderhaar D Hermenia Fritcher  Sonographer Comments: Delay to get into bed. IMPRESSIONS  1. Left ventricular ejection fraction, by estimation, is 60 to 65%. The left ventricle has normal function. The left ventricle has no regional wall motion abnormalities. Left ventricular diastolic parameters are consistent with Grade I diastolic dysfunction (impaired relaxation).  2. Right ventricular systolic function is normal. The right ventricular size is normal. There is mildly elevated pulmonary artery systolic pressure. The estimated right ventricular systolic pressure is 42.4 mmHg.  3. The mitral valve is normal in structure. Trivial mitral valve regurgitation. No evidence of mitral stenosis.  4. The aortic valve is tricuspid. There is mild calcification of the aortic valve. Aortic valve regurgitation is mild. No aortic stenosis is present. Aortic regurgitation PHT measures 689 msec.  5. The inferior vena cava is normal in size with greater than 50% respiratory variability, suggesting right atrial pressure of 3 mmHg. FINDINGS  Left Ventricle: Left ventricular ejection fraction, by estimation, is 60 to 65%. The left ventricle has normal function. The left ventricle has no regional wall motion abnormalities. The left ventricular internal cavity size was normal in size. There is  no left ventricular hypertrophy. Left ventricular diastolic parameters are consistent with Grade I diastolic dysfunction (impaired  relaxation). Normal left ventricular filling pressure. Right Ventricle: The right ventricular size is normal. No increase in right ventricular wall thickness. Right ventricular systolic function is normal. There is mildly elevated pulmonary artery systolic pressure. The tricuspid regurgitant velocity is 3.14  m/s,  and with an assumed right atrial pressure of 3 mmHg, the estimated right ventricular systolic pressure is 42.4 mmHg. Left Atrium: Left atrial size was normal in size. Right Atrium: Right atrial size was normal in size. Pericardium: There is no evidence of pericardial effusion. Mitral Valve: The mitral valve is normal in structure. Trivial mitral valve regurgitation. No evidence of mitral valve stenosis. Tricuspid Valve: The tricuspid valve is normal in structure. Tricuspid valve regurgitation is mild . No evidence of tricuspid stenosis. Aortic Valve: The aortic valve is tricuspid. There is mild calcification of the aortic valve. Aortic valve regurgitation is mild. Aortic regurgitation PHT measures 689 msec. No aortic stenosis is present. Pulmonic Valve: The pulmonic valve was not well visualized. Pulmonic valve regurgitation is not visualized. No evidence of pulmonic stenosis. Aorta: The aortic root and ascending aorta are structurally normal, with no evidence of dilitation. Venous: The inferior vena cava is normal in size with greater than 50% respiratory variability, suggesting right atrial pressure of 3 mmHg. IAS/Shunts: No atrial level shunt detected by color flow Doppler.  LEFT VENTRICLE PLAX 2D LVIDd:         3.40 cm     Diastology LVIDs:         1.90 cm     LV e' medial:    7.94 cm/s LV PW:         1.30 cm     LV E/e' medial:  12.4 LV IVS:        0.90 cm     LV e' lateral:   9.03 cm/s LVOT diam:     2.40 cm     LV E/e' lateral: 10.9 LV SV:         141 LV SV Index:   87 LVOT Area:     4.52 cm  LV Volumes (MOD) LV vol d, MOD A2C: 51.8 ml LV vol d, MOD A4C: 55.8 ml LV vol s, MOD A2C: 18.7 ml LV vol s,  MOD A4C: 16.4 ml LV SV MOD A2C:     33.1 ml LV SV MOD A4C:     55.8 ml LV SV MOD BP:      36.3 ml RIGHT VENTRICLE             IVC RV S prime:     12.10 cm/s  IVC diam: 2.00 cm TAPSE (M-mode): 2.0 cm LEFT ATRIUM             Index        RIGHT ATRIUM           Index LA diam:        3.70 cm 2.29 cm/m   RA Area:     14.00 cm LA Vol (A2C):   42.6 ml 26.36 ml/m  RA Volume:   32.40 ml  20.05 ml/m LA Vol (A4C):   37.4 ml 23.14 ml/m LA Biplane Vol: 42.1 ml 26.05 ml/m  AORTIC VALVE LVOT Vmax:   126.00 cm/s LVOT Vmean:  81.400 cm/s LVOT VTI:    0.311 m AI PHT:      689 msec  AORTA Ao Root diam: 3.40 cm Ao Asc diam:  3.30 cm MITRAL VALVE                TRICUSPID VALVE MV Area (PHT): 3.42 cm     TR Peak grad:   39.4 mmHg MV Decel Time: 222 msec     TR Vmax:        314.00 cm/s MV E  velocity: 98.50 cm/s MV A velocity: 103.00 cm/s  SHUNTS MV E/A ratio:  0.96         Systemic VTI:  0.31 m                             Systemic Diam: 2.40 cm Vishnu Priya Mallipeddi Electronically signed by Diannah Late Mallipeddi Signature Date/Time: 08/01/2023/4:07:16 PM    Final    DG Chest 2 View Result Date: 08/01/2023 CLINICAL DATA:  Pneumothorax. EXAM: CHEST - 2 VIEW COMPARISON:  12/21/2019.  Chest CT 07/31/2023. FINDINGS: The cardio pericardial silhouette is enlarged. There is pulmonary vascular congestion without overt pulmonary edema. Interstitial markings are diffusely coarsened with chronic features. The trace anterior pneumothorax seen on CT scan yesterday is not evident by x-ray today. Multiple left-sided rib fractures evident, better characterized on yesterday's CT scan. Bones are diffusely demineralized. Telemetry leads overlie the chest. IMPRESSION: 1. The trace anterior pneumothorax seen on CT scan yesterday is not evident by x-ray today. 2. Multiple left-sided rib fractures, better characterized on yesterday's CT scan. Electronically Signed   By: Camellia Candle M.D.   On: 08/01/2023 07:38   CT Chest Wo Contrast Addendum Date:  07/31/2023 ADDENDUM REPORT: 07/31/2023 17:45 ADDENDUM: The original report was by Dr. Ryan Salvage. The following addendum is by Dr. Ryan Salvage: Critical Value/emergent results were called by telephone at the time of interpretation on 07/31/2023 at 5:34 pm to provider [facility] , who verbally acknowledged these results. Electronically Signed   By: Ryan Salvage M.D.   On: 07/31/2023 17:45   Result Date: 07/31/2023 CLINICAL DATA:  Chest trauma, clinically suspected left rib fractures. Fall, syncope. EXAM: CT CHEST WITHOUT CONTRAST TECHNIQUE: Multidetector CT imaging of the chest was performed following the standard protocol without IV contrast. RADIATION DOSE REDUCTION: This exam was performed according to the departmental dose-optimization program which includes automated exposure control, adjustment of the mA and/or kV according to patient size and/or use of iterative reconstruction technique. COMPARISON:  Chest radiograph 12/21/2019 and chest CT 07/12/2018 FINDINGS: Cardiovascular: Coronary, aortic arch, and branch vessel atherosclerotic vascular disease. Mild cardiomegaly. Mediastinum/Nodes: Unremarkable Lungs/Pleura: Biapical pleuroparenchymal scarring. Airway thickening noted with cylindrical bronchiectasis in the right middle lobe and airway plugging in the right middle lobe and both lower lobes. Clustered reticulonodular opacities in the right middle lobe posteriorly are increased from prior but still favor atypical infectious bronchiolitis based on morphology. Dependent atelectasis noted in both lower lobes. Airway plugging noted in the left upper lobe. There is a 2% or less left pneumothorax noted best seen anterior to the left pericardium. Trace left hemothorax. Upper Abdomen: Abdominal aortic atherosclerosis, including substantial atheromatous plaque at the origins of the celiac trunk and superior mesenteric artery. Musculoskeletal: Acute segmental fractures of the left third, fourth,  sixth, seventh, eighth, and tenth ribs noted. Additional acute fractures of the left ninth and eleventh ribs noted. Thoracic kyphosis. IMPRESSION: 1. Acute segmental fractures of the left third, fourth, sixth, seventh, eighth, and tenth ribs. Additional acute fractures of the left ninth and eleventh ribs. 2. There is a 2% or less left pneumothorax noted best seen anterior to the left pericardium. Trace left hemothorax. 3. Airway thickening with cylindrical bronchiectasis in the right middle lobe and airway plugging in the right middle lobe and both lower lobes. Clustered reticulonodular opacities in the right middle lobe posteriorly are increased from prior but still favor atypical infectious bronchiolitis based on morphology. 4. Dependent atelectasis in  both lower lobes. 5. Mild cardiomegaly. 6. Coronary, aortic arch, and branch vessel atherosclerotic vascular disease. 7.  Aortic Atherosclerosis (ICD10-I70.0). Radiology assistant personnel have been notified to put me in telephone contact with the referring physician or the referring physician's clinical representative in order to discuss these findings. Once this communication is established I will issue an addendum to this report for documentation purposes. Electronically Signed: By: Ryan Salvage M.D. On: 07/31/2023 17:29   CT Head Wo Contrast Result Date: 07/31/2023 CLINICAL DATA:  Polytrauma, blunt EXAM: CT HEAD WITHOUT CONTRAST CT CERVICAL SPINE WITHOUT CONTRAST TECHNIQUE: Multidetector CT imaging of the head and cervical spine was performed following the standard protocol without intravenous contrast. Multiplanar CT image reconstructions of the cervical spine were also generated. RADIATION DOSE REDUCTION: This exam was performed according to the departmental dose-optimization program which includes automated exposure control, adjustment of the mA and/or kV according to patient size and/or use of iterative reconstruction technique. COMPARISON:  CT head  07/12/2018. FINDINGS: CT HEAD FINDINGS Brain: No evidence of acute infarction, hemorrhage, hydrocephalus, extra-axial collection or mass lesion/mass effect. Vascular: No hyperdense vessel. Skull: No acute fracture.  Small high left scalp contusion. Sinuses/Orbits: Clear sinuses.  No acute orbital findings. Other: No mastoid effusions. CT CERVICAL SPINE FINDINGS Alignment: Approximately 2 mm of anterolisthesis of C4 on C5, likely degenerative given severe facet arthropathy on the left at this level. Mild broad levocurvature. Skull base and vertebrae: Vertebral body heights are maintained. No evidence of acute fracture. Osteopenia. Soft tissues and spinal canal: No prevertebral fluid or swelling. No visible canal hematoma. Disc levels: Multilevel facet uncovertebral hypertrophy. Multilevel degenerative disc disease, greatest at C6-C7. Upper chest: Visualized lung apices are clear. IMPRESSION: 1. No evidence of acute abnormality intracranially or in the cervical spine. 2. Small high left scalp contusion. Electronically Signed   By: Gilmore GORMAN Molt M.D.   On: 07/31/2023 16:37   CT Cervical Spine Wo Contrast Result Date: 07/31/2023 CLINICAL DATA:  Polytrauma, blunt EXAM: CT HEAD WITHOUT CONTRAST CT CERVICAL SPINE WITHOUT CONTRAST TECHNIQUE: Multidetector CT imaging of the head and cervical spine was performed following the standard protocol without intravenous contrast. Multiplanar CT image reconstructions of the cervical spine were also generated. RADIATION DOSE REDUCTION: This exam was performed according to the departmental dose-optimization program which includes automated exposure control, adjustment of the mA and/or kV according to patient size and/or use of iterative reconstruction technique. COMPARISON:  CT head 07/12/2018. FINDINGS: CT HEAD FINDINGS Brain: No evidence of acute infarction, hemorrhage, hydrocephalus, extra-axial collection or mass lesion/mass effect. Vascular: No hyperdense vessel. Skull: No  acute fracture.  Small high left scalp contusion. Sinuses/Orbits: Clear sinuses.  No acute orbital findings. Other: No mastoid effusions. CT CERVICAL SPINE FINDINGS Alignment: Approximately 2 mm of anterolisthesis of C4 on C5, likely degenerative given severe facet arthropathy on the left at this level. Mild broad levocurvature. Skull base and vertebrae: Vertebral body heights are maintained. No evidence of acute fracture. Osteopenia. Soft tissues and spinal canal: No prevertebral fluid or swelling. No visible canal hematoma. Disc levels: Multilevel facet uncovertebral hypertrophy. Multilevel degenerative disc disease, greatest at C6-C7. Upper chest: Visualized lung apices are clear. IMPRESSION: 1. No evidence of acute abnormality intracranially or in the cervical spine. 2. Small high left scalp contusion. Electronically Signed   By: Gilmore GORMAN Molt M.D.   On: 07/31/2023 16:37   CT T-SPINE NO CHARGE Result Date: 07/31/2023 CLINICAL DATA:  Fall, syncopal episode EXAM: CT THORACIC SPINE WITHOUT CONTRAST TECHNIQUE: Multidetector CT images of  the thoracic were obtained using the standard protocol without intravenous contrast. RADIATION DOSE REDUCTION: This exam was performed according to the departmental dose-optimization program which includes automated exposure control, adjustment of the mA and/or kV according to patient size and/or use of iterative reconstruction technique. COMPARISON:  07/27/2018 MRI thoracic spine, 07/12/2018 CT chest no prior dedicated CT of the thoracic spine FINDINGS: Alignment: Exaggeration of the normal thoracic kyphosis. Trace anterolisthesis of C7 on T1. No other significant listhesis. Vertebrae: No acute fracture or suspicious osseous lesion. Chronic mild wedging of T7, T8, and T9. 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 For  additional findings in the thorax, including rib findings, please see same-day CT chest. Disc levels: No significant spinal canal stenosis or neural foraminal narrowing. IMPRESSION: No acute fracture or traumatic listhesis in the thoracic spine. Electronically Signed   By: Donald Campion M.D.   On: 07/31/2023 16:37    Scheduled Meds:    acetaminophen   1,000 mg Oral TID   amLODipine   2.5 mg Oral QHS   atorvastatin   10 mg Oral QHS   brinzolamide   1 drop Both Eyes BID   cephALEXin   250 mg Oral QHS   cholecalciferol   5,000 Units Oral Daily   enoxaparin  (LOVENOX ) injection  40 mg Subcutaneous Q24H   ezetimibe   10 mg Oral Daily   gabapentin   100 mg Oral Daily   latanoprost   1 drop Right Eye QHS   lidocaine   1 patch Transdermal Daily   pantoprazole   40 mg Oral Daily    Continuous Infusions:       LOS: 2 days     Trenda Mar, MD,  FACP, [facility], [facility], [facility]   [facility] [place]      To contact the attending provider between 7A-7P or the covering provider during after hours 7P-7A, please log into the web site [email or web address] and access using universal [place] password for that web site. If you do not have the password, please call the hospital operator.  08/02/2023, 1:17 PM

## 2023-08-03 DIAGNOSIS — R55 Syncope and collapse: Secondary | ICD-10-CM | POA: Diagnosis not present

## 2023-08-03 DIAGNOSIS — E876 Hypokalemia: Secondary | ICD-10-CM | POA: Diagnosis not present

## 2023-08-03 DIAGNOSIS — S2242XA Multiple fractures of ribs, left side, initial encounter for closed fracture: Secondary | ICD-10-CM | POA: Diagnosis not present

## 2023-08-03 LAB — COMPREHENSIVE METABOLIC PANEL
ALT: 17 U/L (ref 0–44)
AST: 27 U/L (ref 15–41)
Albumin: 3.1 g/dL — ABNORMAL LOW (ref 3.5–5.0)
Alkaline Phosphatase: 42 U/L (ref 38–126)
Anion gap: 17 — ABNORMAL HIGH (ref 5–15)
BUN: 10 mg/dL (ref 8–23)
CO2: 22 mmol/L (ref 22–32)
Calcium: 9.1 mg/dL (ref 8.9–10.3)
Chloride: 97 mmol/L — ABNORMAL LOW (ref 98–111)
Creatinine, Ser: 0.81 mg/dL (ref 0.44–1.00)
GFR, Estimated: >60 mL/min (ref >60)
Glucose, Bld: 112 mg/dL — ABNORMAL HIGH (ref 70–99)
Potassium: 3.1 mmol/L — ABNORMAL LOW (ref 3.5–5.1)
Sodium: 136 mmol/L (ref 135–145)
Total Bilirubin: 0.9 mg/dL (ref 0.0–1.2)
Total Protein: 5.8 g/dL — ABNORMAL LOW (ref 6.5–8.1)

## 2023-08-03 LAB — CBC
HCT: 41.8 % (ref 36.0–46.0)
Hemoglobin: 14 g/dL (ref 12.0–15.0)
MCH: 32.7 pg (ref 26.0–34.0)
MCHC: 33.5 g/dL (ref 30.0–36.0)
MCV: 97.7 fL (ref 80.0–100.0)
Platelets: 180 10*3/uL (ref 150–400)
RBC: 4.28 MIL/uL (ref 3.87–5.11)
RDW: 12.6 % (ref 11.5–15.5)
WBC: 5.1 10*3/uL (ref 4.0–10.5)
nRBC: 0 % (ref 0.0–0.2)

## 2023-08-03 LAB — MAGNESIUM: Magnesium: 1.5 mg/dL — ABNORMAL LOW (ref 1.7–2.4)

## 2023-08-03 MED ORDER — LIDOCAINE 5 % EX PTCH
2.0000 | MEDICATED_PATCH | Freq: Every day | CUTANEOUS | Status: DC
  Administered 2023-08-04: 2 via TRANSDERMAL
  Filled 2023-08-03 (×2): qty 2

## 2023-08-03 MED ORDER — POTASSIUM CHLORIDE CRYS ER 10 MEQ PO TBCR
30.0000 meq | EXTENDED_RELEASE_TABLET | ORAL | Status: AC
  Administered 2023-08-03 (×2): 30 meq via ORAL
  Filled 2023-08-03 (×2): qty 1

## 2023-08-03 MED ORDER — MAGNESIUM SULFATE 4 GM/100ML IV SOLN
4.0000 g | Freq: Once | INTRAVENOUS | Status: AC
  Administered 2023-08-03: 4 g via INTRAVENOUS
  Filled 2023-08-03: qty 100

## 2023-08-03 MED ORDER — METOPROLOL SUCCINATE ER 25 MG PO TB24
12.5000 mg | ORAL_TABLET | Freq: Every day | ORAL | Status: DC
  Administered 2023-08-03 – 2023-08-04 (×2): 12.5 mg via ORAL
  Filled 2023-08-03 (×2): qty 1

## 2023-08-03 MED ORDER — POLYETHYLENE GLYCOL 3350 17 G PO PACK
17.0000 g | PACK | Freq: Every day | ORAL | Status: DC
  Administered 2023-08-03 – 2023-08-04 (×2): 17 g via ORAL
  Filled 2023-08-03 (×2): qty 1

## 2023-08-03 MED ORDER — ESTRADIOL 0.1 MG/GM VA CREA
1.0000 | TOPICAL_CREAM | VAGINAL | Status: DC
  Filled 2023-08-03: qty 42.5

## 2023-08-03 MED ORDER — BISACODYL 10 MG RE SUPP
10.0000 mg | Freq: Once | RECTAL | Status: AC
  Administered 2023-08-04: 10 mg via RECTAL
  Filled 2023-08-03 (×2): qty 1

## 2023-08-03 MED ORDER — SENNOSIDES-DOCUSATE SODIUM 8.6-50 MG PO TABS
1.0000 | ORAL_TABLET | Freq: Two times a day (BID) | ORAL | Status: DC
  Administered 2023-08-03 – 2023-08-04 (×3): 1 via ORAL
  Filled 2023-08-03 (×3): qty 1

## 2023-08-03 MED ORDER — METHOCARBAMOL 500 MG PO TABS
500.0000 mg | ORAL_TABLET | Freq: Three times a day (TID) | ORAL | Status: DC
  Administered 2023-08-03 – 2023-08-04 (×3): 500 mg via ORAL
  Filled 2023-08-03 (×3): qty 1

## 2023-08-03 NOTE — Progress Notes (Signed)
 Occupational Therapy Treatment Patient Details Name: Julie Petty MRN: [id number] DOB: [birth year] Today's Date: 08/03/2023   History of present illness Pt is an 89 y.o. female presents to [facility] 07/31/23 from ALF after feeling lightheaded and falling on L side. Pt with L 3-10 rib fxs with very small hematoma, PNA, influenza A. PMHx: HTN, HLD, GERD, lumbar radiculopathy, NSTEMI/CAD.   OT comments  Pt progressing toward established OT goals. Daughter and daughter in law present, supportive, and asking good questions. Pt needing up to min A for LB ADL and toileting today. Pt with decreased activity tolerance and need for frequent rest breaks. Will continue to follow acutely and recommending inpatient rehab <3 hours/day.       If plan is discharge home, recommend the following:  A little help with walking and/or transfers;A little help with bathing/dressing/bathroom;Assistance with cooking/housework;Assist for transportation;Help with stairs or ramp for entrance   Equipment Recommendations  Tub/shower seat;Other (comment) (RW)    Recommendations for Other Services      Precautions / Restrictions Precautions Precautions: Fall;Other (comment) Precaution Comments: watch O2, bladder incontinence Restrictions Weight Bearing Restrictions Per Provider Order: No       Mobility Bed Mobility Overal bed mobility: Needs Assistance Bed Mobility: Sit to Supine       Sit to supine: Contact guard assist   General bed mobility comments: CGA for safety    Transfers Overall transfer level: Needs assistance Equipment used: Rolling walker (2 wheels) Transfers: Sit to/from Stand Sit to Stand: Contact guard assist           General transfer comment: hands on guarding for safety; fair carry over from prior PT session this morning for hand placement, only requiring one verbal cue during session     Balance Overall balance assessment: Needs assistance, Mild deficits observed, not formally  tested, History of Falls Sitting-balance support: No upper extremity supported, Feet supported Sitting balance-Leahy Scale: Good     Standing balance support: Bilateral upper extremity supported, During functional activity, Reliant on assistive device for balance Standing balance-Leahy Scale: Poor Standing balance comment: reliant on RW                           ADL either performed or assessed with clinical judgement   ADL Overall ADL's : Needs assistance/impaired     Grooming: Set up;Sitting Grooming Details (indicate cue type and reason): Pt unable to stand for ADL after functional mobility to toilet reporting she is too fatigued             Lower Body Dressing: Minimal assistance;Sitting/lateral leans Lower Body Dressing Details (indicate cue type and reason): to don R sock; discomfort to L ribs with reaching with LUE Toilet Transfer: Contact guard assist;Rolling walker (2 wheels);Ambulation;Grab bars   Toileting- Clothing Manipulation and Hygiene: Contact guard assist;Sitting/lateral lean;Sit to/from stand       Functional mobility during ADLs: Contact guard assist;Rolling walker (2 wheels)      Extremity/Trunk Assessment Upper Extremity Assessment Upper Extremity Assessment: Generalized weakness   Lower Extremity Assessment Lower Extremity Assessment: Defer to PT evaluation        Vision       Perception     Praxis      Cognition Arousal: Alert Behavior During Therapy: [facility] for tasks assessed/performed Overall Cognitive Status: Within Functional Limits for tasks assessed  Exercises      Shoulder Instructions       General Comments on 1.5L supplementary O2 on arrival but VSS so doffed and pt remains VSS. On RA on exit with RN aware and daughter/daughter in law present    Pertinent Vitals/ Pain       Pain Assessment Pain Assessment: Faces Faces Pain Scale: Hurts even more Pain  Location: L rib fx areas with ADL and reaching Pain Descriptors / Indicators: Tender, Stabbing Pain Intervention(s): Limited activity within patient's tolerance, Monitored during session  Home Living Family/patient expects to be discharged to:: Assisted living                                        Prior Functioning/Environment              Frequency  Min 1X/week        Progress Toward Goals  OT Goals(current goals can now be found in the care plan section)  Progress towards OT goals: Progressing toward goals  Acute Rehab OT Goals Patient Stated Goal: get better OT Goal Formulation: With patient/family Time For Goal Achievement: 08/15/23 Potential to Achieve Goals: Good  Plan      Co-evaluation                 AM-PAC OT "6 Clicks" Daily Activity     Outcome Measure   Help from another person eating meals?: None Help from another person taking care of personal grooming?: A Little Help from another person toileting, which includes using toliet, bedpan, or urinal?: A Little Help from another person bathing (including washing, rinsing, drying)?: A Lot Help from another person to put on and taking off regular upper body clothing?: A Little Help from another person to put on and taking off regular lower body clothing?: A Little 6 Click Score: 18    End of Session Equipment Utilized During Treatment: Gait belt;Rolling walker (2 wheels)  OT Visit Diagnosis: Unsteadiness on feet (R26.81);Other abnormalities of gait and mobility (R26.89);History of falling (Z91.81);Muscle weakness (generalized) (M62.81);Pain Pain - Right/Left: Left Pain - part of body:  (ribs)   Activity Tolerance Patient limited by fatigue   Patient Left in chair;with call bell/phone within reach;with family/visitor present   Nurse Communication Mobility status        Time: 1610-9604 OT Time Calculation (min): 38 min  Charges: OT General Charges $OT Visit: 1 Visit OT  Treatments $Self Care/Home Management : 38-52 mins  Karilyn Ouch, OTR/L [facility] Acute Rehabilitation Office: [phone]   Julie Petty 08/03/2023, 12:43 PM

## 2023-08-03 NOTE — Progress Notes (Signed)
 Mobility Specialist Progress Note:   08/03/23 1500  Oxygen Therapy  SpO2 95 %  O2 Device Room Air  Mobility  Activity Ambulated with assistance in hallway  Level of Assistance Contact guard assist, steadying assist  Assistive Device Front wheel walker  Distance Ambulated (ft) 100 ft  Activity Response Tolerated well  Mobility Referral Yes  Mobility visit 1 Mobility  Mobility Specialist Start Time (ACUTE ONLY) 1459  Mobility Specialist Stop Time (ACUTE ONLY) 1509  Mobility Specialist Time Calculation (min) (ACUTE ONLY) 10 min    Post Mobility: 95% SpO2  Pt received in BR, requesting assistance out. Family assisted in peri care. Agreeable to ambulation and sitting up in chair. SpO2 in high 90s on room air. No complaints throughout. Pt left in chair with call bell and all needs met.  [place]ante Nolon Baxter Mobility Specialist Please contact via Special educational needs teacher or Rehab office at [phone]

## 2023-08-03 NOTE — Progress Notes (Addendum)
 PROGRESS NOTE   Julie Petty  HQI:[id number]    DOB: [birth year]    DOA: 07/31/2023  PCP: Jearlean Mince, PA-C   I have briefly reviewed patients previous medical records in [facility].  Chief Complaint  Patient presents with   Fall   Emesis   Loss of Consciousness    Brief Hospital Course:  89 year old female, lives alone at an independent living facility, independent and still drives, medical history significant for HTN, HLD, GERD, mild CAD by cardiac cath in 2019, Takotsubo syndrome with complete recovery, recurrent UTIs on Keflex , presented to the [facility] ED via EMS following an episode of syncope, fall, head and left-sided chest pain.  She was seen at an urgent care on 2/4 for URTI symptoms, diagnosed with possible viral illness, reports that her symptoms did not get better with the 2 medications she was given, was not given Tamiflu).  She had ongoing persistent cough, fatigue, runny nose with progressive weakness for 3 to 4 days PTA.  On day of admission, while attempting to get water from her kitchen sink, felt lightheaded, passed out, fell hitting her head and left forearm and left side of her chest.  She states that she may have passed out for about 2 minutes, then was able to call for assistance.  Admitted for confirmed influenza A, several (about 8) left-sided rib fractures, tiny left pneumothorax (resolved on follow-up chest x-ray).  Seen by general surgery at [facility] who recommended transfer to [facility] for evaluation by their trauma colleagues.  DC to SNF pending adequate pain control and bed availability.   Assessment & Plan:  Principal Problem:   Fall Active Problems:   Multiple closed fractures of ribs of left side   Influenza A   Pneumothorax, traumatic   Community acquired pneumonia of right middle lobe of lung   Influenza A with acute bronchitis Afebrile and hemodynamically stable since admission Appears to be intermittently on oxygen.  Currently  documented as 95% on room air. Supportive treatment and monitor. Out of window for initiation of Tamiflu. Procalcitonin negative, discontinued antibiotics. Consider repeating CT chest without contrast in 4 weeks to ensure resolution of abnormal findings or further evaluation as deemed necessary.  Syncope with collapse Suspected due to progressive influenza A symptoms, poor oral intake and dehydration Briefly hydrated with IV fluids. Telemetry review shows that sinus bradycardia cardia has resolved since 2/9 night.  Mostly sinus rhythm.  At times mild sinus tachycardia in the 120s. Check orthostatic vital signs-reordered. 2D echo 2/8: LVEF 60-65% grade 1 diastolic dysfunction.  No aortic stenosis Patient has been counseled regarding no driving x 6 months in the presence of her sister.  She verbalized understanding.  Multiple (8) left rib fractures/tiny left pneumothorax Sustained post fall Initial CT had shown tiny left pneumothorax. Acute fracture of left 3, 4, 6, 7, 8, 9, 10 and 11th rib. Follow-up chest x-ray 2/8 without evidence of pneumothorax. Trauma MD follow-up appreciated, they signed off.  Agree with incentive spirometry. Multimodality pain control (scheduled Tylenol , lidocaine  patch).  Patient and her children at bedside decline any form of opioids at this time.  Advised them that NSAIDs can only be used briefly because they also carry risks of GI issues and kidney issues.  They wanted to try brief course of IV Toradol .  Increased lidocaine  x 2 patches.  Added Robaxin  500 Mg 3 times daily. PT and OT recommended SNF, TOC on board and working on SNF placement.  Family had  briefly considered option of going home but now they are firm regarding going to [facility] SNF prior to returning home.  Hopefully can have better pain control by tomorrow for DC.  Did receive a dose of IV Toradol  and IV Zofran  this morning.  Fall at home Details as above CT head, C-spine: No acute bony abnormalities  reported. Small high left scalp contusion CT chest with abnormal findings i.e. questionable bronchiectasis reported but procalcitonin negative.?  Chronic versus viral.  No antibiotics indicated.  Essential hypertension/sinus bradycardia: Check orthostatics Continuing amlodipine  2.5 Mg daily. Toprol -XL held on admission due to bradycardia (TSH 0.712).  Bradycardia resolved.  Now intermittently mildly tachycardic?  Rebound.  Resumed Toprol -XL 12.5 Mg daily.  Continue telemetry.  Hypokalemia and hypomagnesemia Replace aggressively and follow.  Hyperlipidemia Continue atorvastatin  and Zetia   GERD Continue PPI  Vitamin D  insufficiency Vitamin D  25-hydroxy: 32.26.  Continue vitamin D  supplements.  Glaucoma Continue PTA meds  History of recurrent UTI Continue bedtime Keflex . UA not indicative of UTI.  Urinary incontinence Per family, recently started a week ago since the diagnosis of flu, even prior to the fall.  Etiology unclear.  Improved per patient.  Could consider outpatient urology follow-up.  Body mass index is 23.24 kg/m.   DVT prophylaxis: enoxaparin  (LOVENOX ) injection 40 mg Start: 07/31/23 2200     Code Status: Limited: Do not attempt resuscitation (DNR) -DNR-LIMITED -Do Not Intubate/DNI :  Family Communication: Daughter and daughter-in-law at bedside. Disposition:  Pending pain control, to SNF pending pain control on p.o. meds     Consultants:   General Surgery/trauma  Procedures:   None  Antimicrobials:   Discontinued ceftriaxone  and doxycycline .   Subjective:  Wished her happy birthday.  Family at bedside.  Reports intermittent left-sided chest wall pain which can be severe.  Mostly on deep breath.  Using incentive spirometry.  Objective:   Vitals:   08/03/23 0607 08/03/23 0748 08/03/23 1500 08/03/23 1532  BP: 122/83 119/64  139/68  Pulse: 91 75  74  Resp: 18 16  17   Temp: 98 F (36.7 C)   97.8 F (36.6 C)  TempSrc: Oral     SpO2: 95%  95% 95%   Weight:      Height:        General exam: Elderly female, moderately built and nourished lying comfortably in reclining chair without distress.  Appeared to be in good spirits and in no pain. Scalp: Unable to clearly identify the contusion reported on imaging. Respiratory system: Clear to auscultation.  No increased work of breathing.  Left-sided chest wall tenderness. Cardiovascular system: S1 & S2 heard, RRR. No JVD, murmurs, rubs, gallops or clicks. No pedal edema.  Telemetry personally reviewed: Since late last evening, sinus rhythm when this morning with intermittent sinus tachycardia up to the 120s. Gastrointestinal system: Abdomen is nondistended, soft and nontender. No organomegaly or masses felt. Normal bowel sounds heard. Central nervous system: Alert and oriented. No focal neurological deficits.  Hard of hearing. Extremities: Symmetric 5 x 5 power.  Small dressing near the left elbow, small blister underneath per patient/sisters report. Skin: No rashes, lesions or ulcers Psychiatry: Judgement and insight appear normal. Mood & affect appropriate.     Data Reviewed:   I have personally reviewed following labs and imaging studies   CBC: Recent Labs  Lab 07/31/23 1540 08/01/23 0410 08/03/23 0536  WBC 9.4 4.3 5.1  HGB 15.0 12.4 14.0  HCT 46.0 37.4 41.8  MCV 101.5* 99.7 97.7  PLT 188 160  180    Basic Metabolic Panel: Recent Labs  Lab 07/31/23 1540 08/01/23 0410 08/03/23 0536  NA 137 138 136  K 3.6 3.5 3.1*  CL 101 106 97*  CO2 26 24 22   GLUCOSE 129* 113* 112*  BUN 27* 23 10  CREATININE 1.05* 0.94 0.81  CALCIUM  9.2 9.1 9.1  MG 2.1  --  1.5*    Liver Function Tests: Recent Labs  Lab 08/03/23 0536  AST 27  ALT 17  ALKPHOS 42  BILITOT 0.9  PROT 5.8*  ALBUMIN 3.1*    CBG: No results for input(s): "GLUCAP" in the last 168 hours.  Microbiology Studies:   Recent Results (from the past 240 hours)  Resp panel by RT-PCR (RSV, Flu A&B, Covid) Anterior  Nasal Swab     Status: Abnormal   Collection Time: 07/31/23  4:03 PM   Specimen: Anterior Nasal Swab  Result Value Ref Range Status   SARS Coronavirus 2 by RT PCR NEGATIVE NEGATIVE Final    Comment: (NOTE) SARS-CoV-2 target nucleic acids are NOT DETECTED.  The SARS-CoV-2 RNA is generally detectable in upper respiratory specimens during the acute phase of infection. The lowest concentration of SARS-CoV-2 viral copies this assay can detect is 138 copies/mL. A negative result does not preclude SARS-Cov-2 infection and should not be used as the sole basis for treatment or other patient management decisions. A negative result may occur with  improper specimen collection/handling, submission of specimen other than nasopharyngeal swab, presence of viral mutation(s) within the areas targeted by this assay, and inadequate number of viral copies(<138 copies/mL). A negative result must be combined with clinical observations, patient history, and epidemiological information. The expected result is Negative.  Fact Sheet for Patients:  BloggerCourse.com  Fact Sheet for Healthcare Providers:  SeriousBroker.it  This test is no t yet approved or cleared by the [place]  FDA and  has been authorized for detection and/or diagnosis of SARS-CoV-2 by FDA under an Emergency Use Authorization (EUA). This EUA will remain  in effect (meaning this test can be used) for the duration of the COVID-19 declaration under Section 564(b)(1) of the Act, 21 U.S.C.section 360bbb-3(b)(1), unless the authorization is terminated  or revoked sooner.       Influenza A by PCR POSITIVE (A) NEGATIVE Final   Influenza B by PCR NEGATIVE NEGATIVE Final    Comment: (NOTE) The Xpert Xpress SARS-CoV-2/FLU/RSV plus assay is intended as an aid in the diagnosis of influenza from Nasopharyngeal swab specimens and should not be used as a sole basis for treatment. Nasal washings  and aspirates are unacceptable for Xpert Xpress SARS-CoV-2/FLU/RSV testing.  Fact Sheet for Patients: BloggerCourse.com  Fact Sheet for Healthcare Providers: SeriousBroker.it  This test is not yet approved or cleared by the [place]  FDA and has been authorized for detection and/or diagnosis of SARS-CoV-2 by FDA under an Emergency Use Authorization (EUA). This EUA will remain in effect (meaning this test can be used) for the duration of the COVID-19 declaration under Section 564(b)(1) of the Act, 21 U.S.C. section 360bbb-3(b)(1), unless the authorization is terminated or revoked.     Resp Syncytial Virus by PCR NEGATIVE NEGATIVE Final    Comment: (NOTE) Fact Sheet for Patients: BloggerCourse.com  Fact Sheet for Healthcare Providers: SeriousBroker.it  This test is not yet approved or cleared by the [place]  FDA and has been authorized for detection and/or diagnosis of SARS-CoV-2 by FDA under an Emergency Use Authorization (EUA). This EUA will remain in effect (meaning this test  can be used) for the duration of the COVID-19 declaration under Section 564(b)(1) of the Act, 21 U.S.C. section 360bbb-3(b)(1), unless the authorization is terminated or revoked.  Performed at [facility], [address] [place], [place] [address]   MRSA Next Gen by PCR, Nasal     Status: None   Collection Time: 07/31/23 11:13 PM   Specimen: Nasal Mucosa; Nasal Swab  Result Value Ref Range Status   MRSA by PCR Next Gen NOT DETECTED NOT DETECTED Final    Comment: (NOTE) The GeneXpert MRSA Assay (FDA approved for NASAL specimens only), is one component of a comprehensive MRSA colonization surveillance program. It is not intended to diagnose MRSA infection nor to guide or monitor treatment for MRSA infections. Test performance is not FDA approved in patients less than 30  years old. Performed at [facility], [address] [place], [place] [address]     Radiology Studies:  No results found.   Scheduled Meds:    acetaminophen   1,000 mg Oral TID   amLODipine   2.5 mg Oral QHS   atorvastatin   10 mg Oral QHS   bisacodyl   10 mg Rectal Once   brinzolamide   1 drop Both Eyes BID   cephALEXin   250 mg Oral QHS   cholecalciferol   5,000 Units Oral Daily   enoxaparin  (LOVENOX ) injection  40 mg Subcutaneous Q24H   ezetimibe   10 mg Oral Daily   gabapentin   100 mg Oral Daily   latanoprost   1 drop Right Eye QHS   [START ON 08/04/2023] lidocaine   2 patch Transdermal Daily   pantoprazole   40 mg Oral Daily   polyethylene glycol  17 g Oral Daily   senna-docusate  1 tablet Oral BID    Continuous Infusions:       LOS: 3 days     Aubrey Blas, MD,  FACP, [facility], [facility], [facility]   [facility] Physician Advisor [place]      To contact the attending provider between 7A-7P or the covering provider during after hours 7P-7A, please log into the web site [email or web address] and access using universal [place] password for that web site. If you do not have the password, please call the hospital operator.  08/03/2023, 4:20 PM

## 2023-08-03 NOTE — Progress Notes (Signed)
 Physical Therapy Treatment  Patient Details Name: Julie Petty MRN: [id number] DOB: 11-05-1931 Today's Date: 08/03/2023   History of Present Illness Pt is an 89 y.o. female presents to [facility] 07/31/23 from ALF after feeling lightheaded and falling on L side. Pt with L 3-10 rib fxs with very small hematoma, PNA, influenza A. PMHx: HTN, HLD, GERD, lumbar radiculopathy, NSTEMI/CAD.    PT Comments  Pt progressing towards physical therapy goals. Daughter and daughter in law who are present in the room during session voice concerns with return home with family support and HHPT. They are not able to provide 24 hour support. Pt initially moving well with minimal reports of pain, however with ambulation to the bathroom pt reports increase in pain while sitting on the toilet up to 8/10. O2 sats down to 86% on RA, up to 91% on 1L/min supplemental O2. Continue to recommend post-acute rehab <3 hours/day as pt and family have concerns with return home, pt getting up multiple times a night to void, and family unable to provide overnight assist. Feel this is reasonable given decreased tolerance for functional activity, O2 status, and high risk for falls. Will continue to follow and progress as able per POC.    If plan is discharge home, recommend the following: A little help with walking and/or transfers;A little help with bathing/dressing/bathroom;Assist for transportation;Help with stairs or ramp for entrance;Assistance with cooking/housework   Can travel by private vehicle     Yes  Equipment Recommendations  None recommended by PT (pt owns equipment)    Recommendations for Other Services       Precautions / Restrictions Precautions Precautions: Fall;Other (comment) Precaution Comments: watch O2, bladder incontinence Restrictions Weight Bearing Restrictions Per Provider Order: No     Mobility  Bed Mobility Overal bed mobility: Needs Assistance Bed Mobility: Supine to Sit     Supine to sit:  Supervision     General bed mobility comments: Close supervision for safety as pt transitioned to EOB. Increased time but overall completed well with minimal complaints of pain.    Transfers Overall transfer level: Needs assistance Equipment used: Rolling walker (2 wheels) Transfers: Sit to/from Stand Sit to Stand: Contact guard assist           General transfer comment: VC's for hand placement on seated surface for safety. No assist required but hands on guarding for safety as pt powered up to full stand.    Ambulation/Gait Ambulation/Gait assistance: Contact guard assist Gait Distance (Feet): 25 Feet Assistive device: Rolling walker (2 wheels) Gait Pattern/deviations: Step-through pattern, Trunk flexed, Narrow base of support Gait velocity: Decreased Gait velocity interpretation: <1.8 ft/sec, indicate of risk for recurrent falls   General Gait Details: Pt ambulated well into the bathroom but noted gait speed a little slower from bathroom to recliner. Pt with increased pain while sitting on toilet and gait speed decreasing after.   Stairs             Wheelchair Mobility     Tilt Bed    Modified Rankin (Stroke Patients Only)       Balance Overall balance assessment: Needs assistance, Mild deficits observed, not formally tested, History of Falls Sitting-balance support: No upper extremity supported, Feet supported Sitting balance-Leahy Scale: Good     Standing balance support: Bilateral upper extremity supported, During functional activity, Reliant on assistive device for balance Standing balance-Leahy Scale: Poor Standing balance comment: reliant on RW  Cognition Arousal: Alert Behavior During Therapy: WFL for tasks assessed/performed Overall Cognitive Status: Within Functional Limits for tasks assessed                                          Exercises      General Comments         Pertinent Vitals/Pain Pain Assessment Pain Assessment: 0-10 Pain Score:  (3/10 at rest, 8/10 with movement) Pain Location: L rib fx areas Pain Descriptors / Indicators: Tender, Stabbing Pain Intervention(s): Limited activity within patient's tolerance, Monitored during session, Repositioned    Home Living                          Prior Function            PT Goals (current goals can now be found in the care plan section) Acute Rehab PT Goals Patient Stated Goal: Be able to return home to [facility] independent living PT Goal Formulation: With patient Time For Goal Achievement: 08/15/23 Potential to Achieve Goals: Good Progress towards PT goals: Progressing toward goals    Frequency    Min 1X/week      PT Plan      Co-evaluation              AM-PAC PT "6 Clicks" Mobility   Outcome Measure  Help needed turning from your back to your side while in a flat bed without using bedrails?: A Little Help needed moving from lying on your back to sitting on the side of a flat bed without using bedrails?: A Little Help needed moving to and from a bed to a chair (including a wheelchair)?: A Little Help needed standing up from a chair using your arms (e.g., wheelchair or bedside chair)?: A Little Help needed to walk in hospital room?: A Little Help needed climbing 3-5 steps with a railing? : A Little 6 Click Score: 18    End of Session Equipment Utilized During Treatment: Gait belt;Oxygen Activity Tolerance: Patient tolerated treatment well Patient left: in bed;with call bell/phone within reach;with bed alarm set;with family/visitor present Nurse Communication: Mobility status;Other (comment) (O2 sats, d/c plan) PT Visit Diagnosis: Unsteadiness on feet (R26.81);History of falling (Z91.81);Muscle weakness (generalized) (M62.81)     Time: 4098-1191 PT Time Calculation (min) (ACUTE ONLY): 39 min  Charges:    $Gait Training: 23-37 mins $Therapeutic  Activity: 8-22 mins PT General Charges $$ ACUTE PT VISIT: 1 Visit                     Simone Dubois, PT, DPT Acute Rehabilitation Services Secure Chat Preferred Office: [phone]    Venus Ginsberg 08/03/2023, 10:01 AM

## 2023-08-03 NOTE — TOC Progression Note (Signed)
 Transition of Care [facility]) - Progression Note    Patient Details  Name: Julie Petty MRN: [id number] Date of Birth: 01-May-1932  [facility] CM/SW Contact  Elspeth Hals, LCSW Phone Number: 08/03/2023, 2:16 PM  Clinical Narrative:   CSW spoke with Amber/[facility], who confirmed pt is resident there, they are planning on her return to [facility]. Will have bed either Tues or Wed.   They do need insurance auth.  1400: CSW unable to reach first contact [name], did speak with daughter Corbin Dess and updated her on plan.  Discussed that pt will need transportation to [facility] and Corbin Dess can transport tomorrow.  Auth request submitted in [place].     Expected Discharge Plan: Skilled Nursing Facility Barriers to Discharge: Continued Medical Work up, English as a second language teacher, SNF Pending bed offer  Expected Discharge Plan and Services       Living arrangements for the past 2 months: Assisted Living Facility                                       Social Determinants of Health (SDOH) Interventions SDOH Screenings   Food Insecurity: No Food Insecurity (07/31/2023)  Housing: Low Risk  (07/31/2023)  Transportation Needs: No Transportation Needs (07/31/2023)  Utilities: Not At Risk (07/31/2023)  Financial Resource Strain: Low Risk  (07/14/2023)   Received from [facility]  Physical Activity: Insufficiently Active (10/09/2022)   Received from [facility]  Social Connections: Moderately Integrated (07/31/2023)  Stress: No Stress Concern Present (02/15/2023)   Received from [facility]  Tobacco Use: Low Risk  (07/31/2023)    Readmission Risk Interventions     No data to display

## 2023-08-04 ENCOUNTER — Other Ambulatory Visit: Payer: Self-pay | Admitting: Cardiology

## 2023-08-04 ENCOUNTER — Telehealth (HOSPITAL_COMMUNITY): Payer: Self-pay | Admitting: Pharmacy Technician

## 2023-08-04 ENCOUNTER — Inpatient Hospital Stay (HOSPITAL_BASED_OUTPATIENT_CLINIC_OR_DEPARTMENT_OTHER)
Admit: 2023-08-04 | Discharge: 2023-08-04 | Disposition: A | Discharge status: Home / Self Care | Payer: Medicare Other | Attending: Cardiology | Admitting: Cardiology

## 2023-08-04 ENCOUNTER — Other Ambulatory Visit (HOSPITAL_COMMUNITY): Payer: Self-pay

## 2023-08-04 DIAGNOSIS — W19XXXS Unspecified fall, sequela: Secondary | ICD-10-CM

## 2023-08-04 DIAGNOSIS — I4892 Unspecified atrial flutter: Secondary | ICD-10-CM

## 2023-08-04 DIAGNOSIS — I10 Essential (primary) hypertension: Secondary | ICD-10-CM

## 2023-08-04 DIAGNOSIS — I483 Typical atrial flutter: Secondary | ICD-10-CM

## 2023-08-04 DIAGNOSIS — E7800 Pure hypercholesterolemia, unspecified: Secondary | ICD-10-CM

## 2023-08-04 DIAGNOSIS — I2510 Atherosclerotic heart disease of native coronary artery without angina pectoris: Secondary | ICD-10-CM

## 2023-08-04 DIAGNOSIS — J101 Influenza due to other identified influenza virus with other respiratory manifestations: Secondary | ICD-10-CM | POA: Diagnosis not present

## 2023-08-04 DIAGNOSIS — S2242XA Multiple fractures of ribs, left side, initial encounter for closed fracture: Secondary | ICD-10-CM | POA: Diagnosis not present

## 2023-08-04 LAB — BASIC METABOLIC PANEL
Anion gap: 12 (ref 5–15)
BUN: 16 mg/dL (ref 8–23)
CO2: 23 mmol/L (ref 22–32)
Calcium: 8.5 mg/dL — ABNORMAL LOW (ref 8.9–10.3)
Chloride: 99 mmol/L (ref 98–111)
Creatinine, Ser: 0.9 mg/dL (ref 0.44–1.00)
GFR, Estimated: >60 mL/min (ref >60)
Glucose, Bld: 103 mg/dL — ABNORMAL HIGH (ref 70–99)
Potassium: 4.4 mmol/L (ref 3.5–5.1)
Sodium: 134 mmol/L — ABNORMAL LOW (ref 135–145)

## 2023-08-04 LAB — MAGNESIUM: Magnesium: 2.5 mg/dL — ABNORMAL HIGH (ref 1.7–2.4)

## 2023-08-04 MED ORDER — POLYETHYLENE GLYCOL 3350 17 G PO PACK: 17.0000 g | PACK | Freq: Every day | ORAL | Status: DC

## 2023-08-04 MED ORDER — APIXABAN 2.5 MG PO TABS: 2.5000 mg | ORAL_TABLET | Freq: Two times a day (BID) | ORAL | Status: DC

## 2023-08-04 MED ORDER — SENNOSIDES-DOCUSATE SODIUM 8.6-50 MG PO TABS: 1.0000 | ORAL_TABLET | Freq: Two times a day (BID) | ORAL | Status: DC

## 2023-08-04 MED ORDER — ACETAMINOPHEN 500 MG PO TABS: 1000.0000 mg | ORAL_TABLET | Freq: Three times a day (TID) | ORAL | Status: AC

## 2023-08-04 MED ORDER — LIDOCAINE 5 % EX PTCH: 2.0000 | MEDICATED_PATCH | Freq: Every day | CUTANEOUS | Status: DC

## 2023-08-04 MED ORDER — METHOCARBAMOL 500 MG PO TABS: 500.0000 mg | ORAL_TABLET | Freq: Three times a day (TID) | ORAL | Status: DC

## 2023-08-04 MED ORDER — APIXABAN 2.5 MG PO TABS
2.5000 mg | ORAL_TABLET | Freq: Two times a day (BID) | ORAL | Status: DC
  Administered 2023-08-04: 2.5 mg via ORAL
  Filled 2023-08-04: qty 1

## 2023-08-04 NOTE — Consult Note (Signed)
Cardiology Consultation   Patient ID: KHALIYAH NORTHROP MRN: [id number]; DOB: 07-Feb-1932  Admit date: 07/31/2023 Date of Consult: 08/04/2023  PCP:  Dani Gobble, PA-C   [facility] Providers Cardiologist:  Thurmon Fair, MD        Patient Profile:   GIAMARIE BUECHE is a 89 y.o. female with a hx of hypertension, hyperlipidemia, CAD, Takotsubo cardiomyopathy with recovered LVEF, refurrent UTIs who is being seen 08/04/2023 for the evaluation of paroxysmal atrial fibrillation with RVR at the request of Dr. Waymon Amato.  History of Present Illness:   Ms. Kobler was brought to the [facility] ED by EMS for evaluation after a syncopal episode resulting in impact to the head. Per notes, patient was evaluated prior to her fall at an urgent care for URI and was treated with promethazine DM syrup. She did not have significant improvement in her URI symptoms and had persistnet cough, fatigue, rhinorrhea with weakness. On the day she was brought to the ED, patient was at her kitchen sink when she became lightheaded/dizzy. She lost consciousness and fell onto her head, chest, left arm. Patient feels that she was unconscious for up to 2 minutes but was then able to call 911. On admission patient found to have influenza A. Trauma imaging noted 8 left sided rib fractures, small left pneumothorax that has since resolved. Patient's syncopal episode has been attributed to an orthostatic event in the setting of influenza associated dehydration/malaise. Telemetry this admission with intermittent tachycardia that has since been felt to possibly reveal paroxysmal atrial fibrillation.   Patient previously followed by Dr. Royann Shivers, last seen in 2020. She underwent LHC in 2019 in the setting of acute chest pain with elevated troponin, was found with 40 stenosis of second diagonal artery. Her echocardiogram was felt to be consistent with Takotsubo cardiomyopathy.   On exam today, patient confirms above account  of her fall and recent illness. She is confident that she felt no palpitations, chest discomfort, or shortness of breath prior to her fall. She completely denies any prior history of feeling rapid HR or palpitations. This recent fall was out of character for her, at baseline she is very active without difficulty ambulating.   Past Medical History:  Diagnosis Date   Complication of anesthesia    Essential hypertension    GERD (gastroesophageal reflux disease)    Glaucoma    Hernia    Hyperlipemia    Myocardial infarction (HCC)    NSTEMI 04/24/18 (40% D2 by LHC; Dx: Takotsubo CM vs coronary spasm vs myocarditis)   PONV (postoperative nausea and vomiting)     Past Surgical History:  Procedure Laterality Date   CARDIAC CATHETERIZATION  04/05/03   CHOLECYSTECTOMY N/A 08/17/2018   Procedure: Laparoscopic Cholecystectomy;  Surgeon: Harriette Bouillon, MD;  Location: [facility] OR;  Service: General;  Laterality: N/A;   KNEE ARTHROSCOPY  09/17/05   left   LEFT HEART CATH AND CORONARY ANGIOGRAPHY N/A 04/26/2018   Procedure: LEFT HEART CATH AND CORONARY ANGIOGRAPHY;  Surgeon: Marykay Lex, MD;  Location: [facility] INVASIVE CV LAB;  Service: Cardiovascular;  Laterality: N/A;   VAGINAL HYSTERECTOMY  1980's     Home Medications:  Prior to Admission medications   Medication Sig Start Date End Date Taking? Authorizing Provider  amLODipine (NORVASC) 5 MG tablet Take 0.5 tablets (2.5 mg total) by mouth at bedtime. 05/07/18  Yes Azalee Course, PA  atorvastatin (LIPITOR) 10 MG tablet Take 10 mg by mouth every other day.   Yes [provider]  brinzolamide (AZOPT) 1 % ophthalmic suspension Place 1 drop into both eyes 2 (two) times daily.    Yes [provider]  cephALEXin (KEFLEX) 250 MG capsule Take 250 mg by mouth at bedtime.   Yes [provider]  Cholecalciferol (VITAMIN D3) 125 MCG (5000 UT) CAPS Take 1 capsule by mouth daily.   Yes [provider]  estradiol (ESTRACE) 0.1 MG/GM  vaginal cream Place 1 Applicatorful vaginally 2 (two) times a week.   Yes [provider]  ezetimibe (ZETIA) 10 MG tablet Take 10 mg by mouth daily.   Yes [provider]  gabapentin (NEURONTIN) 100 MG capsule Take 100 mg by mouth daily.   Yes [provider]  metoprolol succinate (TOPROL-XL) 25 MG 24 hr tablet Take 0.5 tablets (12.5 mg total) by mouth daily. SCHEDULE OFFICE VISIT FOR FUTURE REFILLS 07/08/21  Yes Azalee Course, PA  nitroGLYCERIN (NITROSTAT) 0.4 MG SL tablet Place 1 tablet (0.4 mg total) under the tongue every 5 (five) minutes x 3 doses as needed for chest pain. 04/28/18  Yes Arty Baumgartner, NP  omeprazole (PRILOSEC) 20 MG capsule Take 20 mg by mouth daily.   Yes [provider]    Inpatient Medications: Scheduled Meds:  acetaminophen  1,000 mg Oral TID   amLODipine  2.5 mg Oral QHS   atorvastatin  10 mg Oral QHS   bisacodyl  10 mg Rectal Once   brinzolamide  1 drop Both Eyes BID   cephALEXin  250 mg Oral QHS   cholecalciferol  5,000 Units Oral Daily   enoxaparin (LOVENOX) injection  40 mg Subcutaneous Q24H   estradiol  1 Applicatorful Vaginal Once per day on Monday Thursday   ezetimibe  10 mg Oral Daily   gabapentin  100 mg Oral Daily   latanoprost  1 drop Right Eye QHS   lidocaine  2 patch Transdermal Daily   methocarbamol  500 mg Oral TID   metoprolol succinate  12.5 mg Oral Daily   pantoprazole  40 mg Oral Daily   polyethylene glycol  17 g Oral Daily   senna-docusate  1 tablet Oral BID   Continuous Infusions:  PRN Meds: ketorolac, ondansetron **OR** ondansetron (ZOFRAN) IV, polyvinyl alcohol, senna-docusate  Allergies:    Allergies  Allergen Reactions   Dicyclomine Itching, Nausea Only, Rash and Other (See Comments)    Stomach upset   Morphine Sulfate Nausea And Vomiting   Mupirocin    Statins Other (See Comments)    Caused aching   Losartan Potassium Other (See Comments)    Caused stomach upset   Morphine And Codeine  Other (See Comments)    Acid reflux, sick on stomach   Sulfa Antibiotics Nausea And Vomiting and Other (See Comments)    GI Intolerance   Butalbital-Asa-Caff-Codeine     Other Reaction(s): vomiting, vomiting, vomiting   Codeine Nausea Only    Other Reaction(s): GI Intolerance, Not available, vomiting   Elemental Sulfur Nausea Only   Losartan Nausea Only    Social History:   Social History   Socioeconomic History   Marital status: Divorced    Spouse name: Not on file   Number of children: Not on file   Years of education: Not on file   Highest education level: Not on file  Occupational History   Not on file  Tobacco Use   Smoking status: Never   Smokeless tobacco: Never  Vaping Use   Vaping status: Never Used  Substance and Sexual  Activity   Alcohol use: No   Drug use: Never   Sexual activity: Never  Other Topics Concern   Not on file  Social History Narrative   Not on file   Social Drivers of Health   Financial Resource Strain: Low Risk  (07/14/2023)   Received from [facility]   Overall Financial Resource Strain (CARDIA)    Difficulty of Paying Living Expenses: Not very hard  Food Insecurity: No Food Insecurity (07/31/2023)   Hunger Vital Sign    Worried About Running Out of Food in the Last Year: Never true    Ran Out of Food in the Last Year: Never true  Transportation Needs: No Transportation Needs (07/31/2023)   PRAPARE - Administrator, Civil Service (Medical): No    Lack of Transportation (Non-Medical): No  Physical Activity: Insufficiently Active (10/09/2022)   Received from [facility]   Exercise Vital Sign    Days of Exercise per Week: 5 days    Minutes of Exercise per Session: 20 min  Stress: No Stress Concern Present (02/15/2023)   Received from [facility] Occupational [facility] - Occupational Stress Questionnaire    Feeling of Stress : Only a little  Social Connections: Moderately Integrated  (07/31/2023)   Social Connection and Isolation Panel [NHANES]    Frequency of Communication with Friends and Family: More than three times a week    Frequency of Social Gatherings with Friends and Family: More than three times a week    Attends Religious Services: More than 4 times per year    Active Member of [employer] or Organizations: Yes    Attends Engineer, structural: More than 4 times per year    Marital Status: Divorced  Intimate Partner Violence: Not At Risk (07/31/2023)   Humiliation, Afraid, Rape, and Kick questionnaire    Fear of Current or Ex-Partner: No    Emotionally Abused: No    Physically Abused: No    Sexually Abused: No    Family History:    Family History  Problem Relation Age of Onset   Heart attack Brother      ROS:  Please see the history of present illness.   All other ROS reviewed and negative.     Physical Exam/Data:   Vitals:   08/03/23 1532 08/03/23 2049 08/04/23 0429 08/04/23 0746  BP: 139/68 128/62 126/75 (!) 172/80  Pulse: 74 76 68 60  Resp: 17 18 17  (!) 24  Temp: 97.8 F (36.6 C) 98.1 F (36.7 C) 98.7 F (37.1 C) 98 F (36.7 C)  TempSrc:  Oral Oral Oral  SpO2: 95% 96% 97% 94%  Weight:      Height:        Intake/Output Summary (Last 24 hours) at 08/04/2023 1314 Last data filed at 08/03/2023 1700 Gross per 24 hour  Intake 480 ml  Output --  Net 480 ml      07/31/2023    9:33 PM 01/16/2021    8:24 AM 02/03/2020   10:05 AM  Last 3 Weights  Weight (lbs) 131 lb 2.8 oz 136 lb 14.5 oz 137 lb  Weight (kg) 59.5 kg 62.1 kg 62.143 kg     Body mass index is 23.24 kg/m.  General:  Well nourished, well developed, in no acute distress. Tired appearing HEENT: normal Neck: no JVD Vascular: No carotid bruits; Distal pulses 2+ bilaterally Cardiac:  normal S1, S2; RRR; no murmur  Lungs:  clear to  auscultation bilaterally, no wheezing, rhonchi or rales  Abd: soft, nontender, no hepatomegaly  Ext: no edema Musculoskeletal:  No deformities,  BUE and BLE strength normal and equal Skin: warm and dry  Neuro:  CNs 2-12 intact, no focal abnormalities noted Psych:  Normal affect   EKG:  The EKG was personally reviewed and demonstrates:  sinus rhythm Telemetry:  Telemetry was personally reviewed and demonstrates:  sinus rhythm with paroxysmal atrial flutter, variable AV block on telemetry. Peak ventricular rates into the 160s but no sustained tachycardia noted. Sinus rhythm   Relevant CV Studies: Cardiac Studies & Procedures   ______________________________________________________________________________________________ CARDIAC CATHETERIZATION  CARDIAC CATHETERIZATION 04/26/2018  Narrative Images from the original result were not included.   LV end diastolic pressure is normal.  Ost 2nd Diag lesion is 40% stenosed. Otherwise minimal coronary disease.  SUMMARY  Angiographically minimal coronary artery disease -no culprit lesion found.  Normal LV filling pressures.  RECOMMENDATIONS  Return to nursing unit for ongoing care.  TR band removal per protocol.  Medical management for mild CAD.  Consider the possibility of coronary spasm versus myocarditis/ pericarditis for elevated troponin.  Recommend Aspirin 81mg  daily for moderate CAD.  Discharge planning per family service.3    Bryan Lemma, MD Bryan Lemma, M.D., M.S. Interventional Cardiologist  Pager # [phone] Phone # [phone] [address] [place], [place] [address]  Findings Coronary Findings Diagnostic  Dominance: Right  Left Main Vessel was injected. Vessel is normal in caliber and large. Vessel is angiographically normal.  Left Anterior Descending Vessel was injected. Vessel is normal in caliber. Vessel is angiographically normal.  First Diagonal Branch Vessel was injected. Vessel is small in size. Vessel is angiographically normal.  First Septal Branch Vessel was injected. Vessel is small in size. Vessel is angiographically  normal.  Second Diagonal Branch Vessel was injected. Vessel is small in size. Vessel is angiographically normal. Ost 2nd Diag lesion is 40% stenosed.  Second Septal Branch Vessel was injected. Vessel is small in size. Vessel is angiographically normal.  Third Diagonal Branch Vessel was injected. Vessel is small in size. Vessel is angiographically normal.  Third Septal Branch Vessel was injected. Vessel is small in size. Vessel is angiographically normal.  Ramus Intermedius Vessel was injected. Vessel is small. Vessel is angiographically normal.  Left Circumflex Vessel was injected. Vessel is normal in caliber and large. Vessel is angiographically normal.  First Obtuse Marginal Branch Vessel was injected. Vessel is moderate in size. Vessel is angiographically normal.  First Left Posterolateral Branch Vessel was injected. Vessel is small in size. Vessel is angiographically normal.  Left Posterior Atrioventricular Artery Vessel was injected. Vessel is normal in size. moderate Vessel is angiographically normal.  Right Coronary Artery Vessel was injected. Vessel is normal in caliber and large. The vessel exhibits minimal luminal irregularities.  Acute Marginal Branch Vessel was injected. Vessel is small in size. Vessel is angiographically normal.  Right Posterior Descending Artery Vessel was injected. Vessel is small in size. Vessel is angiographically normal.  Inferior Septal Vessel was injected. Vessel is small in size. Vessel is angiographically normal.  Right Posterior Atrioventricular Artery Vessel was injected. Vessel is small in size. Vessel is angiographically normal.  Intervention  No interventions have been documented.     ECHOCARDIOGRAM  ECHOCARDIOGRAM COMPLETE 08/01/2023  Narrative ECHOCARDIOGRAM REPORT    Patient Name:   MAKEISHA JENTSCH Date of Exam: 08/01/2023 Medical Rec #:  [id number]         Height:  63.0 in Accession #:    [id number]         Weight:       131.2 lb Date of Birth:  1931/09/05         BSA:          1.616 m Patient Age:    89 years          BP:           149/71 mmHg Patient Gender: F                 HR:           56 bpm. Exam Location:  Inpatient  Procedure: 2D Echo, Cardiac Doppler and Color Doppler  Indications:    R55 Syncope  History:        Patient has prior history of Echocardiogram examinations, most recent 04/25/2018. Cardiomyopathy, Previous Myocardial Infarction and CAD; Risk Factors:Hypertension and Dyslipidemia. FLU positive. Pneumothorax.  Sonographer:    Sheralyn Boatman RDCS Referring Phys: [phone] ANAND D HONGALGI   Sonographer Comments: Delay to get into bed. IMPRESSIONS   1. Left ventricular ejection fraction, by estimation, is 60 to 65%. The left ventricle has normal function. The left ventricle has no regional wall motion abnormalities. Left ventricular diastolic parameters are consistent with Grade I diastolic dysfunction (impaired relaxation). 2. Right ventricular systolic function is normal. The right ventricular size is normal. There is mildly elevated pulmonary artery systolic pressure. The estimated right ventricular systolic pressure is 42.4 mmHg. 3. The mitral valve is normal in structure. Trivial mitral valve regurgitation. No evidence of mitral stenosis. 4. The aortic valve is tricuspid. There is mild calcification of the aortic valve. Aortic valve regurgitation is mild. No aortic stenosis is present. Aortic regurgitation PHT measures 689 msec. 5. The inferior vena cava is normal in size with greater than 50% respiratory variability, suggesting right atrial pressure of 3 mmHg.  FINDINGS Left Ventricle: Left ventricular ejection fraction, by estimation, is 60 to 65%. The left ventricle has normal function. The left ventricle has no regional wall motion abnormalities. The left ventricular internal cavity size was normal in size. There is no left ventricular hypertrophy. Left ventricular  diastolic parameters are consistent with Grade I diastolic dysfunction (impaired relaxation). Normal left ventricular filling pressure.  Right Ventricle: The right ventricular size is normal. No increase in right ventricular wall thickness. Right ventricular systolic function is normal. There is mildly elevated pulmonary artery systolic pressure. The tricuspid regurgitant velocity is 3.14 m/s, and with an assumed right atrial pressure of 3 mmHg, the estimated right ventricular systolic pressure is 42.4 mmHg.  Left Atrium: Left atrial size was normal in size.  Right Atrium: Right atrial size was normal in size.  Pericardium: There is no evidence of pericardial effusion.  Mitral Valve: The mitral valve is normal in structure. Trivial mitral valve regurgitation. No evidence of mitral valve stenosis.  Tricuspid Valve: The tricuspid valve is normal in structure. Tricuspid valve regurgitation is mild . No evidence of tricuspid stenosis.  Aortic Valve: The aortic valve is tricuspid. There is mild calcification of the aortic valve. Aortic valve regurgitation is mild. Aortic regurgitation PHT measures 689 msec. No aortic stenosis is present.  Pulmonic Valve: The pulmonic valve was not well visualized. Pulmonic valve regurgitation is not visualized. No evidence of pulmonic stenosis.  Aorta: The aortic root and ascending aorta are structurally normal, with no evidence of dilitation.  Venous: The inferior vena cava is normal in size with greater than 50%  respiratory variability, suggesting right atrial pressure of 3 mmHg.  IAS/Shunts: No atrial level shunt detected by color flow Doppler.   LEFT VENTRICLE PLAX 2D LVIDd:         3.40 cm     Diastology LVIDs:         1.90 cm     LV e' medial:    7.94 cm/s LV PW:         1.30 cm     LV E/e' medial:  12.4 LV IVS:        0.90 cm     LV e' lateral:   9.03 cm/s LVOT diam:     2.40 cm     LV E/e' lateral: 10.9 LV SV:         141 LV SV Index:   87 LVOT  Area:     4.52 cm  LV Volumes (MOD) LV vol d, MOD A2C: 51.8 ml LV vol d, MOD A4C: 55.8 ml LV vol s, MOD A2C: 18.7 ml LV vol s, MOD A4C: 16.4 ml LV SV MOD A2C:     33.1 ml LV SV MOD A4C:     55.8 ml LV SV MOD BP:      36.3 ml  RIGHT VENTRICLE             IVC RV S prime:     12.10 cm/s  IVC diam: 2.00 cm TAPSE (M-mode): 2.0 cm  LEFT ATRIUM             Index        RIGHT ATRIUM           Index LA diam:        3.70 cm 2.29 cm/m   RA Area:     14.00 cm LA Vol (A2C):   42.6 ml 26.36 ml/m  RA Volume:   32.40 ml  20.05 ml/m LA Vol (A4C):   37.4 ml 23.14 ml/m LA Biplane Vol: 42.1 ml 26.05 ml/m AORTIC VALVE LVOT Vmax:   126.00 cm/s LVOT Vmean:  81.400 cm/s LVOT VTI:    0.311 m AI PHT:      689 msec  AORTA Ao Root diam: 3.40 cm Ao Asc diam:  3.30 cm  MITRAL VALVE                TRICUSPID VALVE MV Area (PHT): 3.42 cm     TR Peak grad:   39.4 mmHg MV Decel Time: 222 msec     TR Vmax:        314.00 cm/s MV E velocity: 98.50 cm/s MV A velocity: 103.00 cm/s  SHUNTS MV E/A ratio:  0.96         Systemic VTI:  0.31 m Systemic Diam: 2.40 cm  Vishnu Priya Mallipeddi Electronically signed by Winfield Rast Mallipeddi Signature Date/Time: 08/01/2023/4:07:16 PM    Final          ______________________________________________________________________________________________       Laboratory Data:  High Sensitivity Troponin:   Recent Labs  Lab 07/31/23 1540  TROPONINIHS 6     Chemistry Recent Labs  Lab 07/31/23 1540 08/01/23 0410 08/03/23 0536 08/04/23 0530  NA 137 138 136 134*  K 3.6 3.5 3.1* 4.4  CL 101 106 97* 99  CO2 26 24 22 23   GLUCOSE 129* 113* 112* 103*  BUN 27* 23 10 16   CREATININE 1.05* 0.94 0.81 0.90  CALCIUM 9.2 9.1 9.1 8.5*  MG 2.1  --  1.5* 2.5*  GFRNONAA 51*  58* >60 >60  ANIONGAP 10 8 17* 12    Recent Labs  Lab 08/03/23 0536  PROT 5.8*  ALBUMIN 3.1*  AST 27  ALT 17  ALKPHOS 42  BILITOT 0.9   Lipids No results for input(s): "CHOL",  "TRIG", "HDL", "LABVLDL", "LDLCALC", "CHOLHDL" in the last 168 hours.  Hematology Recent Labs  Lab 07/31/23 1540 08/01/23 0410 08/03/23 0536  WBC 9.4 4.3 5.1  RBC 4.53 3.75* 4.28  HGB 15.0 12.4 14.0  HCT 46.0 37.4 41.8  MCV 101.5* 99.7 97.7  MCH 33.1 33.1 32.7  MCHC 32.6 33.2 33.5  RDW 12.6 12.6 12.6  PLT 188 160 180   Thyroid  Recent Labs  Lab 08/01/23 0410  TSH 0.712    BNPNo results for input(s): "BNP", "PROBNP" in the last 168 hours.  DDimer No results for input(s): "DDIMER" in the last 168 hours.   Radiology/Studies:  ECHOCARDIOGRAM COMPLETE Result Date: 08/01/2023    ECHOCARDIOGRAM REPORT   Patient Name:   JAHNAI SLINGERLAND Date of Exam: 08/01/2023 Medical Rec #:  [id number]         Height:       63.0 in Accession #:    [id number]        Weight:       131.2 lb Date of Birth:  September 20, 1931         BSA:          1.616 m Patient Age:    89 years          BP:           149/71 mmHg Patient Gender: F                 HR:           56 bpm. Exam Location:  Inpatient Procedure: 2D Echo, Cardiac Doppler and Color Doppler Indications:    R55 Syncope  History:        Patient has prior history of Echocardiogram examinations, most                 recent 04/25/2018. Cardiomyopathy, Previous Myocardial Infarction                 and CAD; Risk Factors:Hypertension and Dyslipidemia. FLU                 positive. Pneumothorax.  Sonographer:    Sheralyn Boatman RDCS Referring Phys: [phone] ANAND D HONGALGI  Sonographer Comments: Delay to get into bed. IMPRESSIONS  1. Left ventricular ejection fraction, by estimation, is 60 to 65%. The left ventricle has normal function. The left ventricle has no regional wall motion abnormalities. Left ventricular diastolic parameters are consistent with Grade I diastolic dysfunction (impaired relaxation).  2. Right ventricular systolic function is normal. The right ventricular size is normal. There is mildly elevated pulmonary artery systolic pressure. The estimated right ventricular  systolic pressure is 42.4 mmHg.  3. The mitral valve is normal in structure. Trivial mitral valve regurgitation. No evidence of mitral stenosis.  4. The aortic valve is tricuspid. There is mild calcification of the aortic valve. Aortic valve regurgitation is mild. No aortic stenosis is present. Aortic regurgitation PHT measures 689 msec.  5. The inferior vena cava is normal in size with greater than 50% respiratory variability, suggesting right atrial pressure of 3 mmHg. FINDINGS  Left Ventricle: Left ventricular ejection fraction, by estimation, is 60 to 65%. The left ventricle has normal function. The left ventricle has no regional wall  motion abnormalities. The left ventricular internal cavity size was normal in size. There is  no left ventricular hypertrophy. Left ventricular diastolic parameters are consistent with Grade I diastolic dysfunction (impaired relaxation). Normal left ventricular filling pressure. Right Ventricle: The right ventricular size is normal. No increase in right ventricular wall thickness. Right ventricular systolic function is normal. There is mildly elevated pulmonary artery systolic pressure. The tricuspid regurgitant velocity is 3.14  m/s, and with an assumed right atrial pressure of 3 mmHg, the estimated right ventricular systolic pressure is 42.4 mmHg. Left Atrium: Left atrial size was normal in size. Right Atrium: Right atrial size was normal in size. Pericardium: There is no evidence of pericardial effusion. Mitral Valve: The mitral valve is normal in structure. Trivial mitral valve regurgitation. No evidence of mitral valve stenosis. Tricuspid Valve: The tricuspid valve is normal in structure. Tricuspid valve regurgitation is mild . No evidence of tricuspid stenosis. Aortic Valve: The aortic valve is tricuspid. There is mild calcification of the aortic valve. Aortic valve regurgitation is mild. Aortic regurgitation PHT measures 689 msec. No aortic stenosis is present. Pulmonic  Valve: The pulmonic valve was not well visualized. Pulmonic valve regurgitation is not visualized. No evidence of pulmonic stenosis. Aorta: The aortic root and ascending aorta are structurally normal, with no evidence of dilitation. Venous: The inferior vena cava is normal in size with greater than 50% respiratory variability, suggesting right atrial pressure of 3 mmHg. IAS/Shunts: No atrial level shunt detected by color flow Doppler.  LEFT VENTRICLE PLAX 2D LVIDd:         3.40 cm     Diastology LVIDs:         1.90 cm     LV e' medial:    7.94 cm/s LV PW:         1.30 cm     LV E/e' medial:  12.4 LV IVS:        0.90 cm     LV e' lateral:   9.03 cm/s LVOT diam:     2.40 cm     LV E/e' lateral: 10.9 LV SV:         141 LV SV Index:   87 LVOT Area:     4.52 cm  LV Volumes (MOD) LV vol d, MOD A2C: 51.8 ml LV vol d, MOD A4C: 55.8 ml LV vol s, MOD A2C: 18.7 ml LV vol s, MOD A4C: 16.4 ml LV SV MOD A2C:     33.1 ml LV SV MOD A4C:     55.8 ml LV SV MOD BP:      36.3 ml RIGHT VENTRICLE             IVC RV S prime:     12.10 cm/s  IVC diam: 2.00 cm TAPSE (M-mode): 2.0 cm LEFT ATRIUM             Index        RIGHT ATRIUM           Index LA diam:        3.70 cm 2.29 cm/m   RA Area:     14.00 cm LA Vol (A2C):   42.6 ml 26.36 ml/m  RA Volume:   32.40 ml  20.05 ml/m LA Vol (A4C):   37.4 ml 23.14 ml/m LA Biplane Vol: 42.1 ml 26.05 ml/m  AORTIC VALVE LVOT Vmax:   126.00 cm/s LVOT Vmean:  81.400 cm/s LVOT VTI:    0.311 m AI PHT:  689 msec  AORTA Ao Root diam: 3.40 cm Ao Asc diam:  3.30 cm MITRAL VALVE                TRICUSPID VALVE MV Area (PHT): 3.42 cm     TR Peak grad:   39.4 mmHg MV Decel Time: 222 msec     TR Vmax:        314.00 cm/s MV E velocity: 98.50 cm/s MV A velocity: 103.00 cm/s  SHUNTS MV E/A ratio:  0.96         Systemic VTI:  0.31 m                             Systemic Diam: 2.40 cm Vishnu Priya Mallipeddi Electronically signed by Winfield Rast Mallipeddi Signature Date/Time: 08/01/2023/4:07:16 PM    Final     DG Chest 2 View Result Date: 08/01/2023 CLINICAL DATA:  Pneumothorax. EXAM: CHEST - 2 VIEW COMPARISON:  12/21/2019.  Chest CT 07/31/2023. FINDINGS: The cardio pericardial silhouette is enlarged. There is pulmonary vascular congestion without overt pulmonary edema. Interstitial markings are diffusely coarsened with chronic features. The trace anterior pneumothorax seen on CT scan yesterday is not evident by x-ray today. Multiple left-sided rib fractures evident, better characterized on yesterday's CT scan. Bones are diffusely demineralized. Telemetry leads overlie the chest. IMPRESSION: 1. The trace anterior pneumothorax seen on CT scan yesterday is not evident by x-ray today. 2. Multiple left-sided rib fractures, better characterized on yesterday's CT scan. Electronically Signed   By: Kennith Center M.D.   On: 08/01/2023 07:38   CT Chest Wo Contrast Addendum Date: 07/31/2023 ADDENDUM REPORT: 07/31/2023 17:45 ADDENDUM: The original report was by Dr. Gaylyn Rong. The following addendum is by Dr. Gaylyn Rong: Critical Value/emergent results were called by telephone at the time of interpretation on 07/31/2023 at 5:34 pm to provider MATTHEW [facility] , who verbally acknowledged these results. Electronically Signed   By: Gaylyn Rong M.D.   On: 07/31/2023 17:45   Result Date: 07/31/2023 CLINICAL DATA:  Chest trauma, clinically suspected left rib fractures. Fall, syncope. EXAM: CT CHEST WITHOUT CONTRAST TECHNIQUE: Multidetector CT imaging of the chest was performed following the standard protocol without IV contrast. RADIATION DOSE REDUCTION: This exam was performed according to the departmental dose-optimization program which includes automated exposure control, adjustment of the mA and/or kV according to patient size and/or use of iterative reconstruction technique. COMPARISON:  Chest radiograph 12/21/2019 and chest CT 07/12/2018 FINDINGS: Cardiovascular: Coronary, aortic arch, and branch vessel  atherosclerotic vascular disease. Mild cardiomegaly. Mediastinum/Nodes: Unremarkable Lungs/Pleura: Biapical pleuroparenchymal scarring. Airway thickening noted with cylindrical bronchiectasis in the right middle lobe and airway plugging in the right middle lobe and both lower lobes. Clustered reticulonodular opacities in the right middle lobe posteriorly are increased from prior but still favor atypical infectious bronchiolitis based on morphology. Dependent atelectasis noted in both lower lobes. Airway plugging noted in the left upper lobe. There is a 2% or less left pneumothorax noted best seen anterior to the left pericardium. Trace left hemothorax. Upper Abdomen: Abdominal aortic atherosclerosis, including substantial atheromatous plaque at the origins of the celiac trunk and superior mesenteric artery. Musculoskeletal: Acute segmental fractures of the left third, fourth, sixth, seventh, eighth, and tenth ribs noted. Additional acute fractures of the left ninth and eleventh ribs noted. Thoracic kyphosis. IMPRESSION: 1. Acute segmental fractures of the left third, fourth, sixth, seventh, eighth, and tenth ribs. Additional acute fractures of the left ninth  and eleventh ribs. 2. There is a 2% or less left pneumothorax noted best seen anterior to the left pericardium. Trace left hemothorax. 3. Airway thickening with cylindrical bronchiectasis in the right middle lobe and airway plugging in the right middle lobe and both lower lobes. Clustered reticulonodular opacities in the right middle lobe posteriorly are increased from prior but still favor atypical infectious bronchiolitis based on morphology. 4. Dependent atelectasis in both lower lobes. 5. Mild cardiomegaly. 6. Coronary, aortic arch, and branch vessel atherosclerotic vascular disease. 7.  Aortic Atherosclerosis (ICD10-I70.0). Radiology assistant personnel have been notified to put me in telephone contact with the referring physician or the referring  physician's clinical representative in order to discuss these findings. Once this communication is established I will issue an addendum to this report for documentation purposes. Electronically Signed: By: Gaylyn Rong M.D. On: 07/31/2023 17:29   CT Head Wo Contrast Result Date: 07/31/2023 CLINICAL DATA:  Polytrauma, blunt EXAM: CT HEAD WITHOUT CONTRAST CT CERVICAL SPINE WITHOUT CONTRAST TECHNIQUE: Multidetector CT imaging of the head and cervical spine was performed following the standard protocol without intravenous contrast. Multiplanar CT image reconstructions of the cervical spine were also generated. RADIATION DOSE REDUCTION: This exam was performed according to the departmental dose-optimization program which includes automated exposure control, adjustment of the mA and/or kV according to patient size and/or use of iterative reconstruction technique. COMPARISON:  CT head 07/12/2018. FINDINGS: CT HEAD FINDINGS Brain: No evidence of acute infarction, hemorrhage, hydrocephalus, extra-axial collection or mass lesion/mass effect. Vascular: No hyperdense vessel. Skull: No acute fracture.  Small high left scalp contusion. Sinuses/Orbits: Clear sinuses.  No acute orbital findings. Other: No mastoid effusions. CT CERVICAL SPINE FINDINGS Alignment: Approximately 2 mm of anterolisthesis of C4 on C5, likely degenerative given severe facet arthropathy on the left at this level. Mild broad levocurvature. Skull base and vertebrae: Vertebral body heights are maintained. No evidence of acute fracture. Osteopenia. Soft tissues and spinal canal: No prevertebral fluid or swelling. No visible canal hematoma. Disc levels: Multilevel facet uncovertebral hypertrophy. Multilevel degenerative disc disease, greatest at C6-C7. Upper chest: Visualized lung apices are clear. IMPRESSION: 1. No evidence of acute abnormality intracranially or in the cervical spine. 2. Small high left scalp contusion. Electronically Signed   By:  Feliberto Harts M.D.   On: 07/31/2023 16:37   CT Cervical Spine Wo Contrast Result Date: 07/31/2023 CLINICAL DATA:  Polytrauma, blunt EXAM: CT HEAD WITHOUT CONTRAST CT CERVICAL SPINE WITHOUT CONTRAST TECHNIQUE: Multidetector CT imaging of the head and cervical spine was performed following the standard protocol without intravenous contrast. Multiplanar CT image reconstructions of the cervical spine were also generated. RADIATION DOSE REDUCTION: This exam was performed according to the departmental dose-optimization program which includes automated exposure control, adjustment of the mA and/or kV according to patient size and/or use of iterative reconstruction technique. COMPARISON:  CT head 07/12/2018. FINDINGS: CT HEAD FINDINGS Brain: No evidence of acute infarction, hemorrhage, hydrocephalus, extra-axial collection or mass lesion/mass effect. Vascular: No hyperdense vessel. Skull: No acute fracture.  Small high left scalp contusion. Sinuses/Orbits: Clear sinuses.  No acute orbital findings. Other: No mastoid effusions. CT CERVICAL SPINE FINDINGS Alignment: Approximately 2 mm of anterolisthesis of C4 on C5, likely degenerative given severe facet arthropathy on the left at this level. Mild broad levocurvature. Skull base and vertebrae: Vertebral body heights are maintained. No evidence of acute fracture. Osteopenia. Soft tissues and spinal canal: No prevertebral fluid or swelling. No visible canal hematoma. Disc levels: Multilevel facet uncovertebral hypertrophy. Multilevel  degenerative disc disease, greatest at C6-C7. Upper chest: Visualized lung apices are clear. IMPRESSION: 1. No evidence of acute abnormality intracranially or in the cervical spine. 2. Small high left scalp contusion. Electronically Signed   By: Feliberto Harts M.D.   On: 07/31/2023 16:37   CT T-SPINE NO CHARGE Result Date: 07/31/2023 CLINICAL DATA:  Fall, syncopal episode EXAM: CT THORACIC SPINE WITHOUT CONTRAST TECHNIQUE: Multidetector  CT images of the thoracic were obtained using the standard protocol without intravenous contrast. RADIATION DOSE REDUCTION: This exam was performed according to the departmental dose-optimization program which includes automated exposure control, adjustment of the mA and/or kV according to patient size and/or use of iterative reconstruction technique. COMPARISON:  07/27/2018 MRI thoracic spine, 07/12/2018 CT chest no prior dedicated CT of the thoracic spine FINDINGS: Alignment: Exaggeration of the normal thoracic kyphosis. Trace anterolisthesis of C7 on T1. No other significant listhesis. Vertebrae: No acute fracture or suspicious osseous lesion. Chronic mild wedging of T7, T8, and T9. 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 For additional findings in the thorax, including rib findings, please see same-day CT chest. Disc levels: No significant spinal canal stenosis or neural foraminal narrowing. IMPRESSION: No acute fracture or traumatic listhesis in the thoracic spine. Electronically Signed   By: Wiliam Ke M.D.   On: 07/31/2023 16:37     Assessment and Plan:   Paroxysmal atrial flutter  Patient admitted s/p syncopal episode with fall resulting in small pneumothorax and 8 rib fractures. She was maintained on telemetry and noted to have paroxysmal episodes of tachycardia concerning for afib vs flutter. Cardiology consulted for recommendations on management.  On review of telemetry, patient with several episodes of atrial flutter with variable conduction/rapid ventricular response into the 160s. Most recent episode seen around 11:30 on 2/10. No recurrent flutter seen today. Patient completely asymptomatic.  Atrial flutter likely provoked by acute illness with influenza as well as subsequent fall resulting in rib fractures. Given CHA2DS2-VASc  Score = 5, recommend OAC. Patient meets low does Eliquis criteria with age >80 and weight< 60kg. 2.5mg  BID.  Live monitor ordered to assess afib/flutter burden as well as rate control. Given current sinus rhythm in the 60s, would not recommend further rate control at this time, continue home Toprol XL 12.5mg .  Will arrange outpatient follow up, consider echocardiogram at that time.   Hypertension BP this admission generally well controlled despite acute illness and rib fractures. No changes to regimen. Continue Amlodipine 2.5mg , metoprolol succinate.   Mild-moderate CAD Takotsubo cardiomyopathy Patient with LHC in 2019 that noted 40% stenosis of ostial 2nd diag vessel. Echo at the time reflected inferoapical hypokinesis, felt consistent with stress cardiomyopathy. Subsequent TTE showed recovered EF. No recurrent chest pain since.   Hyperlipidemia Continue home statin and Zetia.   Risk Assessment/Risk Scores:          CHA2DS2-VASc Score = 5   This indicates a 7.2% annual risk of stroke. The patient's score is based upon: CHF History: 0 HTN History: 1 Diabetes History: 0 Stroke History: 0 Vascular Disease History: 1 Age Score: 2 Gender Score: 1     [facility] will sign off.   Medication Recommendations:  Eliquis 2.5mg  BID Other recommendations (labs, testing, etc):  Zio monitor placed prior to d/c Follow up as an outpatient:  See AVS  For questions or updates, please  contact [facility] Please consult [email or web address] for contact info under    Signed, Perlie Gold, PA-C  08/04/2023 1:14 PM

## 2023-08-04 NOTE — Discharge Summary (Signed)
Physician Discharge Summary  KAMEKO HUKILL VWU:[id number] DOB: 16-Sep-1931  PCP: Dani Gobble, PA-C  Admitted from: Independent Living Facility Discharged to: SNF  Admit date: 07/31/2023 Discharge date: 08/04/2023  Recommendations for Outpatient Follow-up:    Follow-up Information     MD at SNF Follow up.   Why: To be seen in 2 to 3 days with repeat labs (CBC & BMP).        Dani Gobble, PA-C. Schedule an appointment as soon as possible for a visit.   Specialty: Physician Assistant Why: To be seen upon discharge from SNF. Contact information: [address] [place] [address] [phone]                  Home Health: None    Equipment/Devices: TBD at [facility]    Discharge Condition: Improved and stable.   Code Status: Limited: Do not attempt resuscitation (DNR) -DNR-LIMITED -Do Not Intubate/DNI  Diet recommendation:  Discharge Diet Orders (From admission, onward)     Start     Ordered   08/04/23 0000  Diet - low sodium heart healthy        08/04/23 1420             Discharge Diagnoses:  Principal Problem:   Fall Active Problems:   Multiple closed fractures of ribs of left side   Influenza A   Pneumothorax, traumatic   Community acquired pneumonia of right middle lobe of lung   Brief Hospital Course:  89 year old female, lives alone at an independent living facility, independent and still drives, medical history significant for HTN, HLD, GERD, mild CAD by cardiac cath in 2019, Takotsubo syndrome with complete recovery, recurrent UTIs on Keflex, presented to the [facility] ED via EMS following an episode of syncope, fall, head and left-sided chest pain.  She was seen at an urgent care on 2/4 for URTI symptoms, diagnosed with possible viral illness, reports that her symptoms did not get better with the 2 medications she was given, was not given Tamiflu).  She had ongoing persistent cough, fatigue, runny nose  with progressive weakness for 3 to 4 days PTA.  On day of admission, while attempting to get water from her kitchen sink, felt lightheaded, passed out, fell hitting her head and left forearm and left side of her chest.  She states that she may have passed out for about 2 minutes, then was able to call for assistance.  Admitted for confirmed influenza A, several (about 8) left-sided rib fractures, tiny left pneumothorax (resolved on follow-up chest x-ray).  Seen by general surgery at [facility] who recommended transfer to [facility] for evaluation by their trauma colleagues.  Pain appears to be adequately controlled.  Telemetry showed concerns for intermittent A-fib/atrial flutter with RVR.  Cardiology was consulted.     Assessment & Plan:    Influenza A with acute bronchitis Afebrile and hemodynamically stable since admission Not hypoxic. Out of window for initiation of Tamiflu. Procalcitonin negative, discontinued antibiotics. Consider repeating CT chest without contrast in 4 weeks to ensure resolution of abnormal findings or further evaluation as deemed necessary.   Syncope with collapse Suspected due to progressive influenza A symptoms, poor oral intake and dehydration Briefly hydrated with IV fluids. Negative orthostatic vitals. Low-dose beta-blockers were initially held due to sinus bradycardia in the 50s, then resumed after heart rate improved and started getting into mild sinus tachycardia.  Please see discussion below regarding A-fib/flutter. 2D echo 2/8: LVEF 60-65%  grade 1 diastolic dysfunction.  No aortic stenosis Patient has been counseled regarding no driving x 6 months in the presence of her sister.  She verbalized understanding.  Atrial flutter, in the setting of influenza A Telemetry monitoring over the last 24 hours showed periodic RVR up to 160s. Cardiology consulted and recommend continuing prior low-dose metoprolol succinate 12.5 Mg daily, after discussing risks and benefits regarding DOAC  with her, have initiated reduced dose apixaban 2.5 Mg twice daily and will place a live monitor prior to discharge.  They will arrange outpatient follow-up and have cleared her for discharge home. TSH 0.712.   Multiple (8) left rib fractures/tiny left pneumothorax Sustained post fall Initial CT had shown tiny left pneumothorax. Acute fracture of left 3, 4, 6, 7, 8, 9, 10 and 11th rib. Follow-up chest x-ray 2/8 without evidence of pneumothorax. Trauma MD follow-up appreciated, they signed off.  Agree with incentive spirometry. Multimodality pain control (scheduled Tylenol, lidocaine patch) and Robaxin 500 Mg 3 times daily.  Pending improved pain control, can change Tylenol and Robaxin to 3 times daily as needed.  Although IV Toradol had been added yesterday, she only needed 1 dose yesterday morning and none since.  Pain control improving.   Therapies recommend SNF and patient, family agreeable.   Fall at home Details as above CT head, C-spine: No acute bony abnormalities reported. Small high left scalp contusion CT chest with abnormal findings i.e. questionable bronchiectasis reported but procalcitonin negative.?  Chronic versus viral.  No antibiotics indicated.   Essential hypertension/sinus bradycardia: Continuing amlodipine 2.5 Mg daily and Toprol-XL 12.5 Mg daily. Asymptomatic stable sinus bradycardia at bedtime in the 50s with no pauses.  Sinus rhythm and intermittent atrial flutter with RVR as noted above in the daytime.   Hypokalemia and hypomagnesemia Replaced.   Hyperlipidemia Continue atorvastatin and Zetia   GERD Continue PPI   Vitamin D insufficiency Vitamin D 25-hydroxy: 32.26.  Continue vitamin D supplements.   Glaucoma Continue PTA meds   History of recurrent UTI Continue bedtime Keflex. UA not indicative of UTI.   Urinary incontinence Per family, recently started a week ago since the diagnosis of flu, even prior to the fall.  Etiology unclear.  Improved per  patient.  Could consider outpatient urology follow-up.   Body mass index is 23.24 kg/m.     Consultants:   General Surgery/trauma Cardiology   Procedures:   None   Discharge Instructions  Discharge Instructions     Call MD for:   Complete by: As directed    Passing out or feeling like passing out.   Call MD for:  difficulty breathing, headache or visual disturbances   Complete by: As directed    Call MD for:  extreme fatigue   Complete by: As directed    Call MD for:  persistant dizziness or light-headedness   Complete by: As directed    Call MD for:  persistant nausea and vomiting   Complete by: As directed    Call MD for:  severe uncontrolled pain   Complete by: As directed    Call MD for:  temperature >100.4   Complete by: As directed    Diet - low sodium heart healthy   Complete by: As directed    Discharge instructions   Complete by: As directed    Continue to dose incentive spirometry every 1-2 hours while awake.   Driving Restrictions   Complete by: As directed    Patient must not drive for 6 months.  Patient has been counseled regarding this in the presence of her sister and she verbalized understanding.   Increase activity slowly   Complete by: As directed         Medication List     TAKE these medications    acetaminophen 500 MG tablet Commonly known as: TYLENOL Take 2 tablets (1,000 mg total) by mouth 3 (three) times daily. Continue 1 g 3 times daily for 5 to 7 days and then consider changing to 1 g 3 times daily as needed for pain.   amLODipine 5 MG tablet Commonly known as: NORVASC Take 0.5 tablets (2.5 mg total) by mouth at bedtime.   apixaban 2.5 MG Tabs tablet Commonly known as: ELIQUIS Take 1 tablet (2.5 mg total) by mouth 2 (two) times daily.   atorvastatin 10 MG tablet Commonly known as: LIPITOR Take 10 mg by mouth every other day.   brinzolamide 1 % ophthalmic suspension Commonly known as: AZOPT Place 1 drop into both eyes 2  (two) times daily.   cephALEXin 250 MG capsule Commonly known as: KEFLEX Take 250 mg by mouth at bedtime.   estradiol 0.1 MG/GM vaginal cream Commonly known as: ESTRACE Place 1 Applicatorful vaginally 2 (two) times a week.   ezetimibe 10 MG tablet Commonly known as: ZETIA Take 10 mg by mouth daily.   gabapentin 100 MG capsule Commonly known as: NEURONTIN Take 100 mg by mouth daily.   lidocaine 5 % Commonly known as: LIDODERM Place 2 patches onto the skin daily. Remove & Discard patch within 12 hours or as directed by MD.  Apply to left anterolateral chest wall at site of maximal patient reported pain. Start taking on: August 05, 2023   methocarbamol 500 MG tablet Commonly known as: ROBAXIN Take 1 tablet (500 mg total) by mouth 3 (three) times daily. Take 500 Mg 3 times daily x 5 to 7 days then recommend changing to 500 Mg 3 times daily as needed for muscle spasms.   metoprolol succinate 25 MG 24 hr tablet Commonly known as: TOPROL-XL Take 0.5 tablets (12.5 mg total) by mouth daily. SCHEDULE OFFICE VISIT FOR FUTURE REFILLS   nitroGLYCERIN 0.4 MG SL tablet Commonly known as: NITROSTAT Place 1 tablet (0.4 mg total) under the tongue every 5 (five) minutes x 3 doses as needed for chest pain.   omeprazole 20 MG capsule Commonly known as: PRILOSEC Take 20 mg by mouth daily.   polyethylene glycol 17 g packet Commonly known as: MIRALAX / GLYCOLAX Take 17 g by mouth daily. Start taking on: August 05, 2023   senna-docusate 8.6-50 MG tablet Commonly known as: Senokot-S Take 1 tablet by mouth 2 (two) times daily.   Vitamin D3 125 MCG (5000 UT) Caps Take 1 capsule by mouth daily.       Allergies  Allergen Reactions   Dicyclomine Itching, Nausea Only, Rash and Other (See Comments)    Stomach upset   Morphine Sulfate Nausea And Vomiting   Mupirocin    Statins Other (See Comments)    Caused aching   Losartan Potassium Other (See Comments)    Caused stomach upset    Morphine And Codeine Other (See Comments)    Acid reflux, sick on stomach   Sulfa Antibiotics Nausea And Vomiting and Other (See Comments)    GI Intolerance   Butalbital-Asa-Caff-Codeine     Other Reaction(s): vomiting, vomiting, vomiting   Codeine Nausea Only    Other Reaction(s): GI Intolerance, Not available, vomiting   Elemental Sulfur Nausea  Only   Losartan Nausea Only      Procedures/Studies: ECHOCARDIOGRAM COMPLETE Result Date: 08/01/2023    ECHOCARDIOGRAM REPORT   Patient Name:   RIELYNN TRULSON Date of Exam: 08/01/2023 Medical Rec #:  [id number]         Height:       63.0 in Accession #:    [id number]        Weight:       131.2 lb Date of Birth:  1931-12-15         BSA:          1.616 m Patient Age:    89 years          BP:           149/71 mmHg Patient Gender: F                 HR:           56 bpm. Exam Location:  Inpatient Procedure: 2D Echo, Cardiac Doppler and Color Doppler Indications:    R55 Syncope  History:        Patient has prior history of Echocardiogram examinations, most                 recent 04/25/2018. Cardiomyopathy, Previous Myocardial Infarction                 and CAD; Risk Factors:Hypertension and Dyslipidemia. FLU                 positive. Pneumothorax.  Sonographer:    Sheralyn Boatman RDCS Referring Phys: [phone] Demira Gwynne D Gustavo Meditz  Sonographer Comments: Delay to get into bed. IMPRESSIONS  1. Left ventricular ejection fraction, by estimation, is 60 to 65%. The left ventricle has normal function. The left ventricle has no regional wall motion abnormalities. Left ventricular diastolic parameters are consistent with Grade I diastolic dysfunction (impaired relaxation).  2. Right ventricular systolic function is normal. The right ventricular size is normal. There is mildly elevated pulmonary artery systolic pressure. The estimated right ventricular systolic pressure is 42.4 mmHg.  3. The mitral valve is normal in structure. Trivial mitral valve regurgitation. No evidence of mitral  stenosis.  4. The aortic valve is tricuspid. There is mild calcification of the aortic valve. Aortic valve regurgitation is mild. No aortic stenosis is present. Aortic regurgitation PHT measures 689 msec.  5. The inferior vena cava is normal in size with greater than 50% respiratory variability, suggesting right atrial pressure of 3 mmHg. FINDINGS  Left Ventricle: Left ventricular ejection fraction, by estimation, is 60 to 65%. The left ventricle has normal function. The left ventricle has no regional wall motion abnormalities. The left ventricular internal cavity size was normal in size. There is  no left ventricular hypertrophy. Left ventricular diastolic parameters are consistent with Grade I diastolic dysfunction (impaired relaxation). Normal left ventricular filling pressure. Right Ventricle: The right ventricular size is normal. No increase in right ventricular wall thickness. Right ventricular systolic function is normal. There is mildly elevated pulmonary artery systolic pressure. The tricuspid regurgitant velocity is 3.14  m/s, and with an assumed right atrial pressure of 3 mmHg, the estimated right ventricular systolic pressure is 42.4 mmHg. Left Atrium: Left atrial size was normal in size. Right Atrium: Right atrial size was normal in size. Pericardium: There is no evidence of pericardial effusion. Mitral Valve: The mitral valve is normal in structure. Trivial mitral valve regurgitation. No evidence of mitral valve stenosis. Tricuspid Valve: The tricuspid valve  is normal in structure. Tricuspid valve regurgitation is mild . No evidence of tricuspid stenosis. Aortic Valve: The aortic valve is tricuspid. There is mild calcification of the aortic valve. Aortic valve regurgitation is mild. Aortic regurgitation PHT measures 689 msec. No aortic stenosis is present. Pulmonic Valve: The pulmonic valve was not well visualized. Pulmonic valve regurgitation is not visualized. No evidence of pulmonic stenosis. Aorta:  The aortic root and ascending aorta are structurally normal, with no evidence of dilitation. Venous: The inferior vena cava is normal in size with greater than 50% respiratory variability, suggesting right atrial pressure of 3 mmHg. IAS/Shunts: No atrial level shunt detected by color flow Doppler.  LEFT VENTRICLE PLAX 2D LVIDd:         3.40 cm     Diastology LVIDs:         1.90 cm     LV e' medial:    7.94 cm/s LV PW:         1.30 cm     LV E/e' medial:  12.4 LV IVS:        0.90 cm     LV e' lateral:   9.03 cm/s LVOT diam:     2.40 cm     LV E/e' lateral: 10.9 LV SV:         141 LV SV Index:   87 LVOT Area:     4.52 cm  LV Volumes (MOD) LV vol d, MOD A2C: 51.8 ml LV vol d, MOD A4C: 55.8 ml LV vol s, MOD A2C: 18.7 ml LV vol s, MOD A4C: 16.4 ml LV SV MOD A2C:     33.1 ml LV SV MOD A4C:     55.8 ml LV SV MOD BP:      36.3 ml RIGHT VENTRICLE             IVC RV S prime:     12.10 cm/s  IVC diam: 2.00 cm TAPSE (M-mode): 2.0 cm LEFT ATRIUM             Index        RIGHT ATRIUM           Index LA diam:        3.70 cm 2.29 cm/m   RA Area:     14.00 cm LA Vol (A2C):   42.6 ml 26.36 ml/m  RA Volume:   32.40 ml  20.05 ml/m LA Vol (A4C):   37.4 ml 23.14 ml/m LA Biplane Vol: 42.1 ml 26.05 ml/m  AORTIC VALVE LVOT Vmax:   126.00 cm/s LVOT Vmean:  81.400 cm/s LVOT VTI:    0.311 m AI PHT:      689 msec  AORTA Ao Root diam: 3.40 cm Ao Asc diam:  3.30 cm MITRAL VALVE                TRICUSPID VALVE MV Area (PHT): 3.42 cm     TR Peak grad:   39.4 mmHg MV Decel Time: 222 msec     TR Vmax:        314.00 cm/s MV E velocity: 98.50 cm/s MV A velocity: 103.00 cm/s  SHUNTS MV E/A ratio:  0.96         Systemic VTI:  0.31 m                             Systemic Diam: 2.40 cm Vishnu Priya Mallipeddi Electronically signed by Winfield Rast Mallipeddi  Signature Date/Time: 08/01/2023/4:07:16 PM    Final    DG Chest 2 View Result Date: 08/01/2023 CLINICAL DATA:  Pneumothorax. EXAM: CHEST - 2 VIEW COMPARISON:  12/21/2019.  Chest CT 07/31/2023.  FINDINGS: The cardio pericardial silhouette is enlarged. There is pulmonary vascular congestion without overt pulmonary edema. Interstitial markings are diffusely coarsened with chronic features. The trace anterior pneumothorax seen on CT scan yesterday is not evident by x-ray today. Multiple left-sided rib fractures evident, better characterized on yesterday's CT scan. Bones are diffusely demineralized. Telemetry leads overlie the chest. IMPRESSION: 1. The trace anterior pneumothorax seen on CT scan yesterday is not evident by x-ray today. 2. Multiple left-sided rib fractures, better characterized on yesterday's CT scan. Electronically Signed   By: Kennith Center M.D.   On: 08/01/2023 07:38   CT Chest Wo Contrast Addendum Date: 07/31/2023 ADDENDUM REPORT: 07/31/2023 17:45 ADDENDUM: The original report was by Dr. Gaylyn Rong. The following addendum is by Dr. Gaylyn Rong: Critical Value/emergent results were called by telephone at the time of interpretation on 07/31/2023 at 5:34 pm to provider MATTHEW [facility] , who verbally acknowledged these results. Electronically Signed   By: Gaylyn Rong M.D.   On: 07/31/2023 17:45   Result Date: 07/31/2023 CLINICAL DATA:  Chest trauma, clinically suspected left rib fractures. Fall, syncope. EXAM: CT CHEST WITHOUT CONTRAST TECHNIQUE: Multidetector CT imaging of the chest was performed following the standard protocol without IV contrast. RADIATION DOSE REDUCTION: This exam was performed according to the departmental dose-optimization program which includes automated exposure control, adjustment of the mA and/or kV according to patient size and/or use of iterative reconstruction technique. COMPARISON:  Chest radiograph 12/21/2019 and chest CT 07/12/2018 FINDINGS: Cardiovascular: Coronary, aortic arch, and branch vessel atherosclerotic vascular disease. Mild cardiomegaly. Mediastinum/Nodes: Unremarkable Lungs/Pleura: Biapical pleuroparenchymal scarring. Airway  thickening noted with cylindrical bronchiectasis in the right middle lobe and airway plugging in the right middle lobe and both lower lobes. Clustered reticulonodular opacities in the right middle lobe posteriorly are increased from prior but still favor atypical infectious bronchiolitis based on morphology. Dependent atelectasis noted in both lower lobes. Airway plugging noted in the left upper lobe. There is a 2% or less left pneumothorax noted best seen anterior to the left pericardium. Trace left hemothorax. Upper Abdomen: Abdominal aortic atherosclerosis, including substantial atheromatous plaque at the origins of the celiac trunk and superior mesenteric artery. Musculoskeletal: Acute segmental fractures of the left third, fourth, sixth, seventh, eighth, and tenth ribs noted. Additional acute fractures of the left ninth and eleventh ribs noted. Thoracic kyphosis. IMPRESSION: 1. Acute segmental fractures of the left third, fourth, sixth, seventh, eighth, and tenth ribs. Additional acute fractures of the left ninth and eleventh ribs. 2. There is a 2% or less left pneumothorax noted best seen anterior to the left pericardium. Trace left hemothorax. 3. Airway thickening with cylindrical bronchiectasis in the right middle lobe and airway plugging in the right middle lobe and both lower lobes. Clustered reticulonodular opacities in the right middle lobe posteriorly are increased from prior but still favor atypical infectious bronchiolitis based on morphology. 4. Dependent atelectasis in both lower lobes. 5. Mild cardiomegaly. 6. Coronary, aortic arch, and branch vessel atherosclerotic vascular disease. 7.  Aortic Atherosclerosis (ICD10-I70.0). Radiology assistant personnel have been notified to put me in telephone contact with the referring physician or the referring physician's clinical representative in order to discuss these findings. Once this communication is established I will issue an addendum to this report  for documentation purposes. Electronically Signed: By:  Gaylyn Rong M.D. On: 07/31/2023 17:29   CT Head Wo Contrast Result Date: 07/31/2023 CLINICAL DATA:  Polytrauma, blunt EXAM: CT HEAD WITHOUT CONTRAST CT CERVICAL SPINE WITHOUT CONTRAST TECHNIQUE: Multidetector CT imaging of the head and cervical spine was performed following the standard protocol without intravenous contrast. Multiplanar CT image reconstructions of the cervical spine were also generated. RADIATION DOSE REDUCTION: This exam was performed according to the departmental dose-optimization program which includes automated exposure control, adjustment of the mA and/or kV according to patient size and/or use of iterative reconstruction technique. COMPARISON:  CT head 07/12/2018. FINDINGS: CT HEAD FINDINGS Brain: No evidence of acute infarction, hemorrhage, hydrocephalus, extra-axial collection or mass lesion/mass effect. Vascular: No hyperdense vessel. Skull: No acute fracture.  Small high left scalp contusion. Sinuses/Orbits: Clear sinuses.  No acute orbital findings. Other: No mastoid effusions. CT CERVICAL SPINE FINDINGS Alignment: Approximately 2 mm of anterolisthesis of C4 on C5, likely degenerative given severe facet arthropathy on the left at this level. Mild broad levocurvature. Skull base and vertebrae: Vertebral body heights are maintained. No evidence of acute fracture. Osteopenia. Soft tissues and spinal canal: No prevertebral fluid or swelling. No visible canal hematoma. Disc levels: Multilevel facet uncovertebral hypertrophy. Multilevel degenerative disc disease, greatest at C6-C7. Upper chest: Visualized lung apices are clear. IMPRESSION: 1. No evidence of acute abnormality intracranially or in the cervical spine. 2. Small high left scalp contusion. Electronically Signed   By: Feliberto Harts M.D.   On: 07/31/2023 16:37   CT Cervical Spine Wo Contrast Result Date: 07/31/2023 CLINICAL DATA:  Polytrauma, blunt EXAM: CT HEAD  WITHOUT CONTRAST CT CERVICAL SPINE WITHOUT CONTRAST TECHNIQUE: Multidetector CT imaging of the head and cervical spine was performed following the standard protocol without intravenous contrast. Multiplanar CT image reconstructions of the cervical spine were also generated. RADIATION DOSE REDUCTION: This exam was performed according to the departmental dose-optimization program which includes automated exposure control, adjustment of the mA and/or kV according to patient size and/or use of iterative reconstruction technique. COMPARISON:  CT head 07/12/2018. FINDINGS: CT HEAD FINDINGS Brain: No evidence of acute infarction, hemorrhage, hydrocephalus, extra-axial collection or mass lesion/mass effect. Vascular: No hyperdense vessel. Skull: No acute fracture.  Small high left scalp contusion. Sinuses/Orbits: Clear sinuses.  No acute orbital findings. Other: No mastoid effusions. CT CERVICAL SPINE FINDINGS Alignment: Approximately 2 mm of anterolisthesis of C4 on C5, likely degenerative given severe facet arthropathy on the left at this level. Mild broad levocurvature. Skull base and vertebrae: Vertebral body heights are maintained. No evidence of acute fracture. Osteopenia. Soft tissues and spinal canal: No prevertebral fluid or swelling. No visible canal hematoma. Disc levels: Multilevel facet uncovertebral hypertrophy. Multilevel degenerative disc disease, greatest at C6-C7. Upper chest: Visualized lung apices are clear. IMPRESSION: 1. No evidence of acute abnormality intracranially or in the cervical spine. 2. Small high left scalp contusion. Electronically Signed   By: Feliberto Harts M.D.   On: 07/31/2023 16:37   CT T-SPINE NO CHARGE Result Date: 07/31/2023 CLINICAL DATA:  Fall, syncopal episode EXAM: CT THORACIC SPINE WITHOUT CONTRAST TECHNIQUE: Multidetector CT images of the thoracic were obtained using the standard protocol without intravenous contrast. RADIATION DOSE REDUCTION: This exam was performed  according to the departmental dose-optimization program which includes automated exposure control, adjustment of the mA and/or kV according to patient size and/or use of iterative reconstruction technique. COMPARISON:  07/27/2018 MRI thoracic spine, 07/12/2018 CT chest no prior dedicated CT of the thoracic spine FINDINGS: Alignment: Exaggeration of the normal  thoracic kyphosis. Trace anterolisthesis of C7 on T1. No other significant listhesis. Vertebrae: No acute fracture or suspicious osseous lesion. Chronic mild wedging of T7, T8, and T9. 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 For additional findings in the thorax, including rib findings, please see same-day CT chest. Disc levels: No significant spinal canal stenosis or neural foraminal narrowing. IMPRESSION: No acute fracture or traumatic listhesis in the thoracic spine. Electronically Signed   By: Wiliam Ke M.D.   On: 07/31/2023 16:37      Subjective: Seen this morning.  Feels better.  States that she has ambulated to the bathroom and back.  Chest pain improved.  Able to take deep breaths in my presence without chest pain or discomfort.  No nausea or vomiting.  Passing flatus.  Last BM 2/6, did not want Dulcolax suppository yesterday but willing to try today.  No abdominal pain.  Discharge Exam:  Vitals:   08/03/23 1532 08/03/23 2049 08/04/23 0429 08/04/23 0746  BP: 139/68 128/62 126/75 (!) 172/80  Pulse: 74 76 68 60  Resp: 17 18 17  (!) 24  Temp: 97.8 F (36.6 C) 98.1 F (36.7 C) 98.7 F (37.1 C) 98 F (36.7 C)  TempSrc:  Oral Oral Oral  SpO2: 95% 96% 97% 94%  Weight:      Height:        General exam: Elderly female, moderately built and nourished sitting up comfort Tublu in bed without distress.  Appears to be in good spirits.  No family at bedside. Respiratory  system: Clear to auscultation.  No increased work of breathing.   Cardiovascular system: S1 & S2 heard, RRR. No JVD, murmurs, rubs, gallops or clicks. No pedal edema.  Telemetry personally reviewed: Sinus bradycardia in the 50s overnight, likely during sleep.  Yesterday during the daytime, sinus rhythm.  Occasional and intermittent narrow complex irregular tachycardia up to 161 bpm, in the 130s etc. Gastrointestinal system: Abdomen is nondistended, soft and nontender. No organomegaly or masses felt. Normal bowel sounds heard. Central nervous system: Alert and oriented. No focal neurological deficits.  Hard of hearing, now better with her hearing aids on. Extremities: Symmetric 5 x 5 power.  Small dressing near the left elbow, small blister underneath per patient/sisters report. Skin: No rashes, lesions or ulcers Psychiatry: Judgement and insight appear normal. Mood & affect appropriate.     The results of significant diagnostics from this hospitalization (including imaging, microbiology, ancillary and laboratory) are listed below for reference.     Microbiology: Recent Results (from the past 240 hours)  Resp panel by RT-PCR (RSV, Flu A&B, Covid) Anterior Nasal Swab     Status: Abnormal   Collection Time: 07/31/23  4:03 PM   Specimen: Anterior Nasal Swab  Result Value Ref Range Status   SARS Coronavirus 2 by RT PCR NEGATIVE NEGATIVE Final    Comment: (NOTE) SARS-CoV-2 target nucleic acids are NOT DETECTED.  The SARS-CoV-2 RNA is generally detectable in upper respiratory specimens during the acute phase of infection. The lowest concentration of SARS-CoV-2 viral copies this assay can detect is 138 copies/mL. A negative result does not preclude SARS-Cov-2 infection and should not be used as the sole basis for treatment or other patient management decisions. A negative result may occur with  improper specimen collection/handling, submission of specimen other than nasopharyngeal swab,  presence of viral mutation(s)  within the areas targeted by this assay, and inadequate number of viral copies(<138 copies/mL). A negative result must be combined with clinical observations, patient history, and epidemiological information. The expected result is Negative.  Fact Sheet for Patients:  BloggerCourse.com  Fact Sheet for Healthcare Providers:  SeriousBroker.it  This test is no t yet approved or cleared by the [place] FDA and  has been authorized for detection and/or diagnosis of SARS-CoV-2 by FDA under an Emergency Use Authorization (EUA). This EUA will remain  in effect (meaning this test can be used) for the duration of the COVID-19 declaration under Section 564(b)(1) of the Act, 21 U.S.C.section 360bbb-3(b)(1), unless the authorization is terminated  or revoked sooner.       Influenza A by PCR POSITIVE (A) NEGATIVE Final   Influenza B by PCR NEGATIVE NEGATIVE Final    Comment: (NOTE) The Xpert Xpress SARS-CoV-2/FLU/RSV plus assay is intended as an aid in the diagnosis of influenza from Nasopharyngeal swab specimens and should not be used as a sole basis for treatment. Nasal washings and aspirates are unacceptable for Xpert Xpress SARS-CoV-2/FLU/RSV testing.  Fact Sheet for Patients: BloggerCourse.com  Fact Sheet for Healthcare Providers: SeriousBroker.it  This test is not yet approved or cleared by the [place] FDA and has been authorized for detection and/or diagnosis of SARS-CoV-2 by FDA under an Emergency Use Authorization (EUA). This EUA will remain in effect (meaning this test can be used) for the duration of the COVID-19 declaration under Section 564(b)(1) of the Act, 21 U.S.C. section 360bbb-3(b)(1), unless the authorization is terminated or revoked.     Resp Syncytial Virus by PCR NEGATIVE NEGATIVE Final    Comment: (NOTE) Fact Sheet for  Patients: BloggerCourse.com  Fact Sheet for Healthcare Providers: SeriousBroker.it  This test is not yet approved or cleared by the [place] FDA and has been authorized for detection and/or diagnosis of SARS-CoV-2 by FDA under an Emergency Use Authorization (EUA). This EUA will remain in effect (meaning this test can be used) for the duration of the COVID-19 declaration under Section 564(b)(1) of the Act, 21 U.S.C. section 360bbb-3(b)(1), unless the authorization is terminated or revoked.  Performed at [facility], [address] [place], [place] [address]   MRSA Next Gen by PCR, Nasal     Status: None   Collection Time: 07/31/23 11:13 PM   Specimen: Nasal Mucosa; Nasal Swab  Result Value Ref Range Status   MRSA by PCR Next Gen NOT DETECTED NOT DETECTED Final    Comment: (NOTE) The GeneXpert MRSA Assay (FDA approved for NASAL specimens only), is one component of a comprehensive MRSA colonization surveillance program. It is not intended to diagnose MRSA infection nor to guide or monitor treatment for MRSA infections. Test performance is not FDA approved in patients less than 61 years old. Performed at [facility], [address] [place], [place] [address]      Labs: CBC: Recent Labs  Lab 07/31/23 1540 08/01/23 0410 08/03/23 0536  WBC 9.4 4.3 5.1  HGB 15.0 12.4 14.0  HCT 46.0 37.4 41.8  MCV 101.5* 99.7 97.7  PLT 188 160 180    Basic Metabolic Panel: Recent Labs  Lab 07/31/23 1540 08/01/23 0410 08/03/23 0536 08/04/23 0530  NA 137 138 136 134*  K 3.6 3.5 3.1* 4.4  CL 101 106 97* 99  CO2 26 24 22 23   GLUCOSE 129* 113* 112* 103*  BUN 27* 23 10 16   CREATININE 1.05* 0.94 0.81 0.90  CALCIUM 9.2 9.1 9.1 8.5*  MG 2.1  --  1.5* 2.5*    Liver Function Tests: Recent Labs  Lab 08/03/23 0536  AST 27  ALT 17  ALKPHOS 42  BILITOT 0.9  PROT 5.8*  ALBUMIN 3.1*     Urinalysis     Component Value Date/Time   COLORURINE YELLOW 08/01/2023 1240   APPEARANCEUR CLEAR 08/01/2023 1240   LABSPEC 1.015 08/01/2023 1240   PHURINE 5.0 08/01/2023 1240   GLUCOSEU NEGATIVE 08/01/2023 1240   HGBUR NEGATIVE 08/01/2023 1240   BILIRUBINUR NEGATIVE 08/01/2023 1240   KETONESUR NEGATIVE 08/01/2023 1240   PROTEINUR NEGATIVE 08/01/2023 1240   NITRITE NEGATIVE 08/01/2023 1240   LEUKOCYTESUR NEGATIVE 08/01/2023 1240   Discussed in detail with patient's daughter via phone, updated care and answered all questions.   Time coordinating discharge: 40 minutes  SIGNED:  Marcellus Scott, MD,  FACP, [facility], [facility], [facility]   [facility] Advisor [place]     To contact the attending provider between 7A-7P or the covering provider during after hours 7P-7A, please log into the web site [email or web address] and access using universal [place] password for that web site. If you do not have the password, please call the hospital operator.

## 2023-08-04 NOTE — TOC Progression Note (Addendum)
Transition of Care [facility]) - Progression Note    Patient Details  Name: Julie Petty MRN: [id number] Date of Birth: 08-Feb-1932  Transition of Care [facility]) CM/SW Contact  Lorri Frederick, LCSW Phone Number: 08/04/2023, 1:08 PM  Clinical Narrative:   [facility] Harvey remains pending in [place]. TC [facility]--they do need auth approved prior to her return. Can receive today.   1300: SNF auth approved: [id number], 3 days: 2/11-2/13. MD informed, not clear medically at this time.  Per [facility], would need pt to return by 5pm.  MD aware.   1445: Pt clear for DC.  Sister Bonita Quin in room and is able to transport.  [facility] has DC summary.  Expected Discharge Plan: Skilled Nursing Facility Barriers to Discharge: Continued Medical Work up, English as a second language teacher, SNF Pending bed offer  Expected Discharge Plan and Services       Living arrangements for the past 2 months: Assisted Living Facility                                       Social Determinants of Health (SDOH) Interventions SDOH Screenings   Food Insecurity: No Food Insecurity (07/31/2023)  Housing: Low Risk  (07/31/2023)  Transportation Needs: No Transportation Needs (07/31/2023)  Utilities: Not At Risk (07/31/2023)  Financial Resource Strain: Low Risk  (07/14/2023)   Received from [facility]  Physical Activity: Insufficiently Active (10/09/2022)   Received from [facility], [facility]  Social Connections: Moderately Integrated (07/31/2023)  Stress: No Stress Concern Present (02/15/2023)   Received from [facility]  Tobacco Use: Low Risk  (07/31/2023)    Readmission Risk Interventions     No data to display

## 2023-08-04 NOTE — Telephone Encounter (Signed)
Patient Product/process development scientist completed.    The patient is insured through [facility]. Patient has Medicare and is not eligible for a copay card, but may be able to apply for patient assistance or Medicare RX Payment Plan (Patient Must reach out to their plan, if eligible for payment plan), if available.    Ran test claim for Eliquis 2.5 mg and the current 30 day co-pay is $302.00 due to a $255.00 deductible.  Will be $47.00 once deductible is met.   This test claim was processed through [facility]- copay amounts may vary at other pharmacies due to pharmacy/plan contracts, or as the patient moves through the different stages of their insurance plan.     Roland Earl, CPHT Pharmacy Technician III Certified Patient Advocate [facility] Patient Advocate Team Direct Number: [phone]  Fax: [phone]

## 2023-08-04 NOTE — TOC Transition Note (Signed)
Transition of Care [facility]) - Discharge Note   Patient Details  Name: Julie Petty MRN: [id number] Date of Birth: [birth year]  [facility]) CM/SW Contact:  Lorri Frederick, LCSW Phone Number: 08/04/2023, 2:55 PM   Clinical Narrative:   [facility] to [facility].  RN call report to [phone]. RN report ask for [facility].    Final next level of care: Skilled Nursing Facility Barriers to Discharge: Barriers Resolved   Patient Goals and CMS Choice            Discharge Placement              Patient chooses bed at:  ([facility]) Patient to be transferred to facility by: sister Bonita Quin Name of family member notified: sister Bonita Quin in room Patient and family notified of of transfer: 08/04/23  Discharge Plan and Services Additional resources added to the After Visit Summary for                                       Social Drivers of Health (SDOH) Interventions SDOH Screenings   Food Insecurity: No Food Insecurity (07/31/2023)  Housing: Low Risk  (07/31/2023)  Transportation Needs: No Transportation Needs (07/31/2023)  Utilities: Not At Risk (07/31/2023)  Financial Resource Strain: Low Risk  (07/14/2023)   Received from [facility]  Physical Activity: Insufficiently Active (10/09/2022)   Received from [facility], [facility]  Social Connections: Moderately Integrated (07/31/2023)  Stress: No Stress Concern Present (02/15/2023)   Received from [facility]  Tobacco Use: Low Risk  (07/31/2023)     Readmission Risk Interventions     No data to display

## 2023-08-04 NOTE — Discharge Instructions (Addendum)
Information on my medicine - ELIQUIS (apixaban)  This medication education was reviewed with me or my healthcare representative as part of my discharge preparation. Why was Eliquis prescribed for you? Eliquis was prescribed for you to reduce the risk of a blood clot forming that can cause a stroke if you have a medical condition called atrial fibrillation (a type of irregular heartbeat).  What do You need to know about Eliquis ? Take your Eliquis TWICE DAILY - one tablet in the morning and one tablet in the evening with or without food. If you have difficulty swallowing the tablet whole please discuss with your pharmacist how to take the medication safely.  Take Eliquis exactly as prescribed by your doctor and DO NOT stop taking Eliquis without talking to the doctor who prescribed the medication.  Stopping may increase your risk of developing a stroke.  Refill your prescription before you run out.  After discharge, you should have regular check-up appointments with your healthcare provider that is prescribing your Eliquis.  In the future your dose may need to be changed if your kidney function or weight changes by a significant amount or as you get older.  What do you do if you miss a dose? If you miss a dose, take it as soon as you remember on the same day and resume taking twice daily.  Do not take more than one dose of ELIQUIS at the same time to make up a missed dose.  Important Safety Information A possible side effect of Eliquis is bleeding. You should call your healthcare provider right away if you experience any of the following: Bleeding from an injury or your nose that does not stop. Unusual colored urine (red or dark brown) or unusual colored stools (red or black). Unusual bruising for unknown reasons. A serious fall or if you hit your head (even if there is no bleeding).  Some medicines may interact with Eliquis and might increase your risk of bleeding or clotting while  on Eliquis. To help avoid this, consult your healthcare provider or pharmacist prior to using any new prescription or non-prescription medications, including herbals, vitamins, non-steroidal anti-inflammatory drugs (NSAIDs) and supplements.  This website has more information on Eliquis (apixaban): http://www.eliquis.com/eliquis/home    Additional Discharge Instructions   Please get your medications reviewed and adjusted by your Primary MD.  Please request your Primary MD to go over all Hospital Tests and Procedure/Radiological results at the follow up, please get all Hospital records sent to your Primary MD by signing hospital release before you go home.  If you had Pneumonia of Lung problems at the Hospital: Please get a 2 view Chest X ray done in approximately 4 weeks after hospital discharge or sooner if instructed by your Primary MD.  If you have Congestive Heart Failure: Please call your Cardiologist or Primary MD anytime you have any of the following symptoms:  1) 3 pound weight gain in 24 hours or 5 pounds in 1 week  2) shortness of breath, with or without a dry hacking cough  3) swelling in the hands, feet or stomach  4) if you have to sleep on extra pillows at night in order to breathe  Follow cardiac low salt diet and 1.5 lit/day fluid restriction.  If you have diabetes Accuchecks 4 times/day, Once in AM empty stomach and then before each meal. Log in all results and show them to your primary doctor at your next visit. If any glucose reading is under 80 or above  300 call your primary MD immediately.  If you have Seizure/Convulsions/Epilepsy: Please do not drive, operate heavy machinery, participate in activities at heights or participate in high speed sports until you have seen by Primary MD or a Neurologist and advised to do so again. Per [facility] statutes, patients with seizures are not allowed to drive until they have been seizure-free for six months.  Use  caution when using heavy equipment or power tools. Avoid working on ladders or at heights. Take showers instead of baths. Ensure the water temperature is not too high on the home water heater. Do not go swimming alone. Do not lock yourself in a room alone (i.e. bathroom). When caring for infants or small children, sit down when holding, feeding, or changing them to minimize risk of injury to the child in the event you have a seizure. Maintain good sleep hygiene. Avoid alcohol.   If you had Gastrointestinal Bleeding: Please ask your Primary MD to check a complete blood count within one week of discharge or at your next visit. Your endoscopic/colonoscopic biopsies that are pending at the time of discharge, will also need to followed by your Primary MD.  Get Medicines reviewed and adjusted. Please take all your medications with you for your next visit with your Primary MD  Please request your Primary MD to go over all hospital tests and procedure/radiological results at the follow up, please ask your Primary MD to get all Hospital records sent to his/her office.  If you experience worsening of your admission symptoms, develop shortness of breath, life threatening emergency, suicidal or homicidal thoughts you must seek medical attention immediately by calling 911 or calling your MD immediately  if symptoms less severe.  You must read complete instructions/literature along with all the possible adverse reactions/side effects for all the Medicines you take and that have been prescribed to you. Take any new Medicines after you have completely understood and accpet all the possible adverse reactions/side effects.   Do not drive or operate heavy machinery when taking Pain medications.   Do not take more than prescribed Pain, Sleep and Anxiety Medications  Special Instructions: If you have smoked or chewed Tobacco  in the last 2 yrs please stop smoking, stop any regular Alcohol  and or any Recreational drug  use.  Wear Seat belts while driving.  Please note You were cared for by a hospitalist during your hospital stay. If you have any questions about your discharge medications or the care you received while you were in the hospital after you are discharged, you can call the unit and asked to speak with the hospitalist on call if the hospitalist that took care of you is not available. Once you are discharged, your primary care physician will handle any further medical issues. Please note that NO REFILLS for any discharge medications will be authorized once you are discharged, as it is imperative that you return to your primary care physician (or establish a relationship with a primary care physician if you do not have one) for your aftercare needs so that they can reassess your need for medications and monitor your lab values.  You can reach the hospitalist office at phone [phone] or fax [phone]   If you do not have a primary care physician, you can call [phone] for a physician referral.

## 2023-08-04 NOTE — Progress Notes (Signed)
PHARMACY - ANTICOAGULATION CONSULT NOTE  Pharmacy Consult for Eliquis Indication: atrial fibrillation  Allergies  Allergen Reactions   Dicyclomine Itching, Nausea Only, Rash and Other (See Comments)    Stomach upset   Morphine Sulfate Nausea And Vomiting   Mupirocin    Statins Other (See Comments)    Caused aching   Losartan Potassium Other (See Comments)    Caused stomach upset   Morphine And Codeine Other (See Comments)    Acid reflux, sick on stomach   Sulfa Antibiotics Nausea And Vomiting and Other (See Comments)    GI Intolerance   Butalbital-Asa-Caff-Codeine     Other Reaction(s): vomiting, vomiting, vomiting   Codeine Nausea Only    Other Reaction(s): GI Intolerance, Not available, vomiting   Elemental Sulfur Nausea Only   Losartan Nausea Only    Patient Measurements: Height: 5' 2.99" (160 cm) Weight: 59.5 kg (131 lb 2.8 oz) IBW/kg (Calculated) : 52.38  Vital Signs: Temp: 98 F (36.7 C) (02/11 0746) Temp Source: Oral (02/11 0746) BP: 172/80 (02/11 0746) Pulse Rate: 60 (02/11 0746)  Labs: Recent Labs    08/03/23 0536 08/04/23 0530  HGB 14.0  --   HCT 41.8  --   PLT 180  --   CREATININE 0.81 0.90    Estimated Creatinine Clearance: 34.4 mL/min (by C-G formula based on SCr of 0.9 mg/dL).   Medical History: Past Medical History:  Diagnosis Date   Complication of anesthesia    Essential hypertension    GERD (gastroesophageal reflux disease)    Glaucoma    Hernia    Hyperlipemia    Myocardial infarction (HCC)    NSTEMI 04/24/18 (40% D2 by LHC; Dx: Takotsubo CM vs coronary spasm vs myocarditis)   PONV (postoperative nausea and vomiting)     Medications:  Scheduled:   acetaminophen  1,000 mg Oral TID   amLODipine  2.5 mg Oral QHS   atorvastatin  10 mg Oral QHS   bisacodyl  10 mg Rectal Once   brinzolamide  1 drop Both Eyes BID   cephALEXin  250 mg Oral QHS   cholecalciferol  5,000 Units Oral Daily   estradiol  1 Applicatorful Vaginal Once per  day on Monday Thursday   ezetimibe  10 mg Oral Daily   gabapentin  100 mg Oral Daily   latanoprost  1 drop Right Eye QHS   lidocaine  2 patch Transdermal Daily   methocarbamol  500 mg Oral TID   metoprolol succinate  12.5 mg Oral Daily   pantoprazole  40 mg Oral Daily   polyethylene glycol  17 g Oral Daily   senna-docusate  1 tablet Oral BID    Assessment: A 89 YOF who was admitted due to syncopal episode and found to be in paroxysmal atrial fibrillation. Patient has a CHADs-VASc Score of 5. Pharmacy has been consulted to initiate Eliquis.  Patient does meet criteria for reduced Eliquis dosing as age > 88 YO and weight < 60 kg. Hgb stable at 14 and PLT count stable at 180.  Goal of Therapy:  Monitor platelets by anticoagulation protocol: Yes   Plan:  Initiate Eliquis 2.5 mg PO BID Monitor for any signs and symptoms of new onset bleeding Continue to monitor CBC as necessary  Lennie Muckle, PharmD PGY1 Pharmacy Resident 08/04/2023 2:02 PM

## 2023-08-04 NOTE — Progress Notes (Signed)
  Zio to assess afib/flutter burden. Results to Dr. Cristal Deer. Patient started on Eliquis 2.5mg  BID.

## 2023-08-04 NOTE — Progress Notes (Signed)
Called and gave report to [facility] [employer].

## 2023-08-04 NOTE — Progress Notes (Signed)
Mobility Specialist Progress Note:    08/04/23 0900  Oxygen Therapy  O2 Device Room Air  Mobility  Activity Ambulated with assistance in hallway  Level of Assistance Contact guard assist, steadying assist  Assistive Device Front wheel walker  Distance Ambulated (ft) 130 ft  Activity Response Tolerated well  Mobility Referral Yes  Mobility visit 1 Mobility  Mobility Specialist Start Time (ACUTE ONLY) 0940  Mobility Specialist Stop Time (ACUTE ONLY) 0950  Mobility Specialist Time Calculation (min) (ACUTE ONLY) 10 min   Pt received in bed and agreeable. No complaints throughout ambulation. Had some SOB upon returning to room. Practiced pursed lipped breathing, with lowest SpO2 level at 89%. Pt left in chair with call bell and all needs met. Family present and chair alarm on.  D'Vante Earlene Plater Mobility Specialist Please contact via Special educational needs teacher or Rehab office at [phone]

## 2023-08-05 ENCOUNTER — Encounter: Payer: Self-pay | Admitting: Adult Health

## 2023-08-05 ENCOUNTER — Non-Acute Institutional Stay (SKILLED_NURSING_FACILITY): Payer: Self-pay | Admitting: Adult Health

## 2023-08-05 DIAGNOSIS — S2242XS Multiple fractures of ribs, left side, sequela: Secondary | ICD-10-CM

## 2023-08-05 DIAGNOSIS — E785 Hyperlipidemia, unspecified: Secondary | ICD-10-CM

## 2023-08-05 DIAGNOSIS — K219 Gastro-esophageal reflux disease without esophagitis: Secondary | ICD-10-CM

## 2023-08-05 DIAGNOSIS — I10 Essential (primary) hypertension: Secondary | ICD-10-CM

## 2023-08-05 DIAGNOSIS — R55 Syncope and collapse: Secondary | ICD-10-CM

## 2023-08-05 DIAGNOSIS — W19XXXS Unspecified fall, sequela: Secondary | ICD-10-CM

## 2023-08-05 DIAGNOSIS — J101 Influenza due to other identified influenza virus with other respiratory manifestations: Secondary | ICD-10-CM

## 2023-08-05 DIAGNOSIS — I4892 Unspecified atrial flutter: Secondary | ICD-10-CM

## 2023-08-05 NOTE — Progress Notes (Incomplete)
Location:  [facility] Room Number: 30 A Place of Service:  SNF (31) Provider:  Kenard Gower, DNP, FNP-BC  Patient Care Team: Marella Bile as PCP - General (Physician Assistant) Thurmon Fair, MD as PCP - Cardiology (Cardiology)  Extended Emergency Contact Information Primary Emergency Contact: [name],[name] Address: [address]          [place], [place] [address] [name] of [place] Home Phone: [phone] Mobile Phone: [phone] Relation: Relative Secondary Emergency Contact: Maurine Minister, [place] Home Phone: [phone] Mobile Phone: [phone] Relation: Daughter  Code Status:  Limited DNR  Goals of care: Advanced Directive information    07/31/2023   10:00 PM  Advanced Directives  Does Patient Have a Medical Advance Directive? Yes  Type of Estate agent of [place];Living will  Does patient want to make changes to medical advance directive? No - Patient declined     Chief Complaint  Patient presents with  . Acute Visit    Follow up hospitalization 07/31/23 to 08/04/23    HPI:  Pt is a 89 y.o. female seen today for follow up of hospitalization 07/31/23 to 08/04/23. She was admitted to [facility] on 08/04/2023.  She has a PMH of hypertension, hyperlipidemia, GERD, mild CAD by cardiac cath in 2019, Takotsubo syndrome with complete recovery, recurrent UTIs on Keflex for prophylaxis.  She had syncope, fell at home hitting her head and had left-sided chest pain.  Of note, she was seen at an urgent care 3 days prior to her fall for URTI symptoms and was diagnosed with possible viral illness.  She was given 2 medications, not Tamiflu, but her symptoms did not get better.  She was diagnosed with influenza A, about 8 left-sided rib fractures, tiny left pneumothorax (resolved on follow-up chest x-ray).  General surgery was consulted and recommended transfer to [facility] for  evaluation by their trauma team.  Pain was controlled.  Telemetry showed concerns for intermittent atrial fibrillation/atrial flutter with RVR.  Cardiology was consulted.  She was not treated with Tamiflu since she was out of the window for initiation of treatment.  She was given IV fluids for hydration and low-dose beta-blockers were initially held due to sinus bradycardia in the 50s then resumed she started getting into mild sinus tachycardia.  She was initiated on low-dose apixaban 2.5 mg twice a day and was placed on a heart monitor for prior to discharge.  Past Medical History:  Diagnosis Date  . Complication of anesthesia   . Essential hypertension   . GERD (gastroesophageal reflux disease)   . Glaucoma   . Hernia   . Hyperlipemia   . Myocardial infarction (HCC)    NSTEMI 04/24/18 (40% D2 by LHC; Dx: Takotsubo CM vs coronary spasm vs myocarditis)  . PONV (postoperative nausea and vomiting)    Past Surgical History:  Procedure Laterality Date  . CARDIAC CATHETERIZATION  04/05/03  . CHOLECYSTECTOMY N/A 08/17/2018   Procedure: Laparoscopic Cholecystectomy;  Surgeon: Harriette Bouillon, MD;  Location: [facility] OR;  Service: General;  Laterality: N/A;  . KNEE ARTHROSCOPY  09/17/05   left  . LEFT HEART CATH AND CORONARY ANGIOGRAPHY N/A 04/26/2018   Procedure: LEFT HEART CATH AND CORONARY ANGIOGRAPHY;  Surgeon: Marykay Lex, MD;  Location: [facility] INVASIVE CV LAB;  Service: Cardiovascular;  Laterality: N/A;  . VAGINAL HYSTERECTOMY  1980's    Allergies  Allergen Reactions  . Dicyclomine Itching, Nausea  Only, Rash and Other (See Comments)    Stomach upset  . Morphine Sulfate Nausea And Vomiting  . Mupirocin   . Statins Other (See Comments)    Caused aching  . Losartan Potassium Other (See Comments)    Caused stomach upset  . Morphine And Codeine Other (See Comments)    Acid reflux, sick on stomach  . Sulfa Antibiotics Nausea And Vomiting and Other (See Comments)    GI Intolerance  .  Butalbital-Asa-Caff-Codeine     Other Reaction(s): vomiting, vomiting, vomiting  . Codeine Nausea Only    Other Reaction(s): GI Intolerance, Not available, vomiting  . Elemental Sulfur Nausea Only  . Losartan Nausea Only    Outpatient Encounter Medications as of 08/05/2023  Medication Sig  . acetaminophen (TYLENOL) 500 MG tablet Take 2 tablets (1,000 mg total) by mouth 3 (three) times daily. Continue 1 g 3 times daily for 5 to 7 days and then consider changing to 1 g 3 times daily as needed for pain.  Marland Kitchen amLODipine (NORVASC) 5 MG tablet Take 0.5 tablets (2.5 mg total) by mouth at bedtime.  Marland Kitchen apixaban (ELIQUIS) 2.5 MG TABS tablet Take 1 tablet (2.5 mg total) by mouth 2 (two) times daily.  Marland Kitchen atorvastatin (LIPITOR) 10 MG tablet Take 10 mg by mouth every other day.  . brinzolamide (AZOPT) 1 % ophthalmic suspension Place 1 drop into both eyes 2 (two) times daily.   . cephALEXin (KEFLEX) 250 MG capsule Take 250 mg by mouth at bedtime.  . Cholecalciferol (VITAMIN D3) 125 MCG (5000 UT) CAPS Take 1 capsule by mouth daily.  Marland Kitchen estradiol (ESTRACE) 0.1 MG/GM vaginal cream Place 1 Applicatorful vaginally 2 (two) times a week.  . ezetimibe (ZETIA) 10 MG tablet Take 10 mg by mouth daily.  Marland Kitchen gabapentin (NEURONTIN) 100 MG capsule Take 100 mg by mouth daily.  Marland Kitchen lidocaine (LIDODERM) 5 % Place 2 patches onto the skin daily. Remove & Discard patch within 12 hours or as directed by MD.  Apply to left anterolateral chest wall at site of maximal patient reported pain.  . methocarbamol (ROBAXIN) 500 MG tablet Take 1 tablet (500 mg total) by mouth 3 (three) times daily. Take 500 Mg 3 times daily x 5 to 7 days then recommend changing to 500 Mg 3 times daily as needed for muscle spasms.  . metoprolol succinate (TOPROL-XL) 25 MG 24 hr tablet Take 0.5 tablets (12.5 mg total) by mouth daily. SCHEDULE OFFICE VISIT FOR FUTURE REFILLS  . nitroGLYCERIN (NITROSTAT) 0.4 MG SL tablet Place 1 tablet (0.4 mg total) under the tongue  every 5 (five) minutes x 3 doses as needed for chest pain.  Marland Kitchen omeprazole (PRILOSEC) 20 MG capsule Take 20 mg by mouth daily.  . polyethylene glycol (MIRALAX / GLYCOLAX) 17 g packet Take 17 g by mouth daily.  Marland Kitchen senna-docusate (SENOKOT-S) 8.6-50 MG tablet Take 1 tablet by mouth 2 (two) times daily.   No facility-administered encounter medications on file as of 08/05/2023.    Review of Systems  Constitutional:  Negative for appetite change, chills, fatigue and fever.  HENT:  Negative for congestion, hearing loss, rhinorrhea and sore throat.   Eyes: Negative.   Respiratory:  Negative for cough, shortness of breath and wheezing.   Cardiovascular:  Negative for chest pain, palpitations and leg swelling.  Gastrointestinal:  Negative for abdominal pain, constipation, diarrhea, nausea and vomiting.  Genitourinary:  Negative for dysuria.  Musculoskeletal:  Negative for arthralgias, back pain and myalgias.  Skin:  Negative  for color change, rash and wound.  Neurological:  Negative for dizziness, weakness and headaches.  Psychiatric/Behavioral:  Negative for behavioral problems. The patient is not nervous/anxious.       Immunization History  Administered Date(s) Administered  . Influenza, High Dose Seasonal PF 03/26/2019  . Moderna Covid-19 Fall Seasonal Vaccine 60yrs & older 03/21/2022  . PFIZER Comirnaty(Gray Top)Covid-19 Tri-Sucrose Vaccine 10/17/2020  . PFIZER(Purple Top)SARS-COV-2 Vaccination 07/17/2019, 08/08/2019  . Research officer, trade [employer] 65yrs & up 03/12/2021   Pertinent  Health Maintenance Due  Topic Date Due  . DEXA SCAN  Never done  . INFLUENZA VACCINE  Completed      04/27/2018    7:05 PM 04/28/2018    8:00 AM 08/17/2018   10:33 AM 12/21/2019   11:31 AM 01/16/2021    8:27 AM  Fall Risk  (RETIRED) Patient Fall Risk Level Moderate fall risk Moderate fall risk High fall risk Low fall risk Low fall risk     Vitals:   08/05/23 1727  BP: (!) 147/83  Pulse: 69   Resp: 20  Temp: (!) 97.1 F (36.2 C)  SpO2: 92%  Weight: 131 lb 2.8 oz (59.5 kg)  Height: 5' 2.99" (1.6 m)   Body mass index is 23.24 kg/m.  Physical Exam Constitutional:      Appearance: Normal appearance.  HENT:     Head: Normocephalic and atraumatic.     Nose: Nose normal.     Mouth/Throat:     Mouth: Mucous membranes are moist.  Eyes:     Conjunctiva/sclera: Conjunctivae normal.  Cardiovascular:     Rate and Rhythm: Normal rate and regular rhythm.  Pulmonary:     Effort: Pulmonary effort is normal.     Breath sounds: Normal breath sounds.  Abdominal:     General: Bowel sounds are normal.     Palpations: Abdomen is soft.  Musculoskeletal:        General: Normal range of motion.     Cervical back: Normal range of motion.  Skin:    General: Skin is warm and dry.  Neurological:     General: No focal deficit present.     Mental Status: She is alert and oriented to person, place, and time.  Psychiatric:        Mood and Affect: Mood normal.        Behavior: Behavior normal.        Thought Content: Thought content normal.        Judgment: Judgment normal.        Labs reviewed: Recent Labs    07/31/23 1540 08/01/23 0410 08/03/23 0536 08/04/23 0530  NA 137 138 136 134*  K 3.6 3.5 3.1* 4.4  CL 101 106 97* 99  CO2 26 24 22 23   GLUCOSE 129* 113* 112* 103*  BUN 27* 23 10 16   CREATININE 1.05* 0.94 0.81 0.90  CALCIUM 9.2 9.1 9.1 8.5*  MG 2.1  --  1.5* 2.5*   Recent Labs    08/03/23 0536  AST 27  ALT 17  ALKPHOS 42  BILITOT 0.9  PROT 5.8*  ALBUMIN 3.1*   Recent Labs    07/31/23 1540 08/01/23 0410 08/03/23 0536  WBC 9.4 4.3 5.1  HGB 15.0 12.4 14.0  HCT 46.0 37.4 41.8  MCV 101.5* 99.7 97.7  PLT 188 160 180   Lab Results  Component Value Date   TSH 0.712 08/01/2023   Lab Results  Component Value Date   HGBA1C 5.6 04/24/2018  Lab Results  Component Value Date   CHOL 147 07/30/2018   HDL 51 07/30/2018   LDLCALC 79 07/30/2018   TRIG 85  07/30/2018   CHOLHDL 2.9 07/30/2018    Significant Diagnostic Results in last 30 days:  ECHOCARDIOGRAM COMPLETE Result Date: 08/01/2023    ECHOCARDIOGRAM REPORT   Patient Name:   Julie Petty Date of Exam: 08/01/2023 Medical Rec #:  [id number]         Height:       63.0 in Accession #:    [id number]        Weight:       131.2 lb Date of Birth:  August 26, 1931         BSA:          1.616 m Patient Age:    89 years          BP:           149/71 mmHg Patient Gender: F                 HR:           56 bpm. Exam Location:  Inpatient Procedure: 2D Echo, Cardiac Doppler and Color Doppler Indications:    R55 Syncope  History:        Patient has prior history of Echocardiogram examinations, most                 recent 04/25/2018. Cardiomyopathy, Previous Myocardial Infarction                 and CAD; Risk Factors:Hypertension and Dyslipidemia. FLU                 positive. Pneumothorax.  Sonographer:    Sheralyn Boatman RDCS Referring Phys: [phone] ANAND D HONGALGI  Sonographer Comments: Delay to get into bed. IMPRESSIONS  1. Left ventricular ejection fraction, by estimation, is 60 to 65%. The left ventricle has normal function. The left ventricle has no regional wall motion abnormalities. Left ventricular diastolic parameters are consistent with Grade I diastolic dysfunction (impaired relaxation).  2. Right ventricular systolic function is normal. The right ventricular size is normal. There is mildly elevated pulmonary artery systolic pressure. The estimated right ventricular systolic pressure is 42.4 mmHg.  3. The mitral valve is normal in structure. Trivial mitral valve regurgitation. No evidence of mitral stenosis.  4. The aortic valve is tricuspid. There is mild calcification of the aortic valve. Aortic valve regurgitation is mild. No aortic stenosis is present. Aortic regurgitation PHT measures 689 msec.  5. The inferior vena cava is normal in size with greater than 50% respiratory variability, suggesting right atrial pressure  of 3 mmHg. FINDINGS  Left Ventricle: Left ventricular ejection fraction, by estimation, is 60 to 65%. The left ventricle has normal function. The left ventricle has no regional wall motion abnormalities. The left ventricular internal cavity size was normal in size. There is  no left ventricular hypertrophy. Left ventricular diastolic parameters are consistent with Grade I diastolic dysfunction (impaired relaxation). Normal left ventricular filling pressure. Right Ventricle: The right ventricular size is normal. No increase in right ventricular wall thickness. Right ventricular systolic function is normal. There is mildly elevated pulmonary artery systolic pressure. The tricuspid regurgitant velocity is 3.14  m/s, and with an assumed right atrial pressure of 3 mmHg, the estimated right ventricular systolic pressure is 42.4 mmHg. Left Atrium: Left atrial size was normal in size. Right Atrium: Right atrial size was normal in size. Pericardium:  There is no evidence of pericardial effusion. Mitral Valve: The mitral valve is normal in structure. Trivial mitral valve regurgitation. No evidence of mitral valve stenosis. Tricuspid Valve: The tricuspid valve is normal in structure. Tricuspid valve regurgitation is mild . No evidence of tricuspid stenosis. Aortic Valve: The aortic valve is tricuspid. There is mild calcification of the aortic valve. Aortic valve regurgitation is mild. Aortic regurgitation PHT measures 689 msec. No aortic stenosis is present. Pulmonic Valve: The pulmonic valve was not well visualized. Pulmonic valve regurgitation is not visualized. No evidence of pulmonic stenosis. Aorta: The aortic root and ascending aorta are structurally normal, with no evidence of dilitation. Venous: The inferior vena cava is normal in size with greater than 50% respiratory variability, suggesting right atrial pressure of 3 mmHg. IAS/Shunts: No atrial level shunt detected by color flow Doppler.  LEFT VENTRICLE PLAX 2D LVIDd:          3.40 cm     Diastology LVIDs:         1.90 cm     LV e' medial:    7.94 cm/s LV PW:         1.30 cm     LV E/e' medial:  12.4 LV IVS:        0.90 cm     LV e' lateral:   9.03 cm/s LVOT diam:     2.40 cm     LV E/e' lateral: 10.9 LV SV:         141 LV SV Index:   87 LVOT Area:     4.52 cm  LV Volumes (MOD) LV vol d, MOD A2C: 51.8 ml LV vol d, MOD A4C: 55.8 ml LV vol s, MOD A2C: 18.7 ml LV vol s, MOD A4C: 16.4 ml LV SV MOD A2C:     33.1 ml LV SV MOD A4C:     55.8 ml LV SV MOD BP:      36.3 ml RIGHT VENTRICLE             IVC RV S prime:     12.10 cm/s  IVC diam: 2.00 cm TAPSE (M-mode): 2.0 cm LEFT ATRIUM             Index        RIGHT ATRIUM           Index LA diam:        3.70 cm 2.29 cm/m   RA Area:     14.00 cm LA Vol (A2C):   42.6 ml 26.36 ml/m  RA Volume:   32.40 ml  20.05 ml/m LA Vol (A4C):   37.4 ml 23.14 ml/m LA Biplane Vol: 42.1 ml 26.05 ml/m  AORTIC VALVE LVOT Vmax:   126.00 cm/s LVOT Vmean:  81.400 cm/s LVOT VTI:    0.311 m AI PHT:      689 msec  AORTA Ao Root diam: 3.40 cm Ao Asc diam:  3.30 cm MITRAL VALVE                TRICUSPID VALVE MV Area (PHT): 3.42 cm     TR Peak grad:   39.4 mmHg MV Decel Time: 222 msec     TR Vmax:        314.00 cm/s MV E velocity: 98.50 cm/s MV A velocity: 103.00 cm/s  SHUNTS MV E/A ratio:  0.96         Systemic VTI:  0.31 m  Systemic Diam: 2.40 cm Vishnu Priya Mallipeddi Electronically signed by Winfield Rast Mallipeddi Signature Date/Time: 08/01/2023/4:07:16 PM    Final    DG Chest 2 View Result Date: 08/01/2023 CLINICAL DATA:  Pneumothorax. EXAM: CHEST - 2 VIEW COMPARISON:  12/21/2019.  Chest CT 07/31/2023. FINDINGS: The cardio pericardial silhouette is enlarged. There is pulmonary vascular congestion without overt pulmonary edema. Interstitial markings are diffusely coarsened with chronic features. The trace anterior pneumothorax seen on CT scan yesterday is not evident by x-ray today. Multiple left-sided rib fractures evident,  better characterized on yesterday's CT scan. Bones are diffusely demineralized. Telemetry leads overlie the chest. IMPRESSION: 1. The trace anterior pneumothorax seen on CT scan yesterday is not evident by x-ray today. 2. Multiple left-sided rib fractures, better characterized on yesterday's CT scan. Electronically Signed   By: Kennith Center M.D.   On: 08/01/2023 07:38   CT Chest Wo Contrast Addendum Date: 07/31/2023 ADDENDUM REPORT: 07/31/2023 17:45 ADDENDUM: The original report was by Dr. Gaylyn Rong. The following addendum is by Dr. Gaylyn Rong: Critical Value/emergent results were called by telephone at the time of interpretation on 07/31/2023 at 5:34 pm to provider MATTHEW [facility] , who verbally acknowledged these results. Electronically Signed   By: Gaylyn Rong M.D.   On: 07/31/2023 17:45   Result Date: 07/31/2023 CLINICAL DATA:  Chest trauma, clinically suspected left rib fractures. Fall, syncope. EXAM: CT CHEST WITHOUT CONTRAST TECHNIQUE: Multidetector CT imaging of the chest was performed following the standard protocol without IV contrast. RADIATION DOSE REDUCTION: This exam was performed according to the departmental dose-optimization program which includes automated exposure control, adjustment of the mA and/or kV according to patient size and/or use of iterative reconstruction technique. COMPARISON:  Chest radiograph 12/21/2019 and chest CT 07/12/2018 FINDINGS: Cardiovascular: Coronary, aortic arch, and branch vessel atherosclerotic vascular disease. Mild cardiomegaly. Mediastinum/Nodes: Unremarkable Lungs/Pleura: Biapical pleuroparenchymal scarring. Airway thickening noted with cylindrical bronchiectasis in the right middle lobe and airway plugging in the right middle lobe and both lower lobes. Clustered reticulonodular opacities in the right middle lobe posteriorly are increased from prior but still favor atypical infectious bronchiolitis based on morphology. Dependent atelectasis  noted in both lower lobes. Airway plugging noted in the left upper lobe. There is a 2% or less left pneumothorax noted best seen anterior to the left pericardium. Trace left hemothorax. Upper Abdomen: Abdominal aortic atherosclerosis, including substantial atheromatous plaque at the origins of the celiac trunk and superior mesenteric artery. Musculoskeletal: Acute segmental fractures of the left third, fourth, sixth, seventh, eighth, and tenth ribs noted. Additional acute fractures of the left ninth and eleventh ribs noted. Thoracic kyphosis. IMPRESSION: 1. Acute segmental fractures of the left third, fourth, sixth, seventh, eighth, and tenth ribs. Additional acute fractures of the left ninth and eleventh ribs. 2. There is a 2% or less left pneumothorax noted best seen anterior to the left pericardium. Trace left hemothorax. 3. Airway thickening with cylindrical bronchiectasis in the right middle lobe and airway plugging in the right middle lobe and both lower lobes. Clustered reticulonodular opacities in the right middle lobe posteriorly are increased from prior but still favor atypical infectious bronchiolitis based on morphology. 4. Dependent atelectasis in both lower lobes. 5. Mild cardiomegaly. 6. Coronary, aortic arch, and branch vessel atherosclerotic vascular disease. 7.  Aortic Atherosclerosis (ICD10-I70.0). Radiology assistant personnel have been notified to put me in telephone contact with the referring physician or the referring physician's clinical representative in order to discuss these findings. Once this communication is established I will  issue an addendum to this report for documentation purposes. Electronically Signed: By: Gaylyn Rong M.D. On: 07/31/2023 17:29   CT Head Wo Contrast Result Date: 07/31/2023 CLINICAL DATA:  Polytrauma, blunt EXAM: CT HEAD WITHOUT CONTRAST CT CERVICAL SPINE WITHOUT CONTRAST TECHNIQUE: Multidetector CT imaging of the head and cervical spine was performed  following the standard protocol without intravenous contrast. Multiplanar CT image reconstructions of the cervical spine were also generated. RADIATION DOSE REDUCTION: This exam was performed according to the departmental dose-optimization program which includes automated exposure control, adjustment of the mA and/or kV according to patient size and/or use of iterative reconstruction technique. COMPARISON:  CT head 07/12/2018. FINDINGS: CT HEAD FINDINGS Brain: No evidence of acute infarction, hemorrhage, hydrocephalus, extra-axial collection or mass lesion/mass effect. Vascular: No hyperdense vessel. Skull: No acute fracture.  Small high left scalp contusion. Sinuses/Orbits: Clear sinuses.  No acute orbital findings. Other: No mastoid effusions. CT CERVICAL SPINE FINDINGS Alignment: Approximately 2 mm of anterolisthesis of C4 on C5, likely degenerative given severe facet arthropathy on the left at this level. Mild broad levocurvature. Skull base and vertebrae: Vertebral body heights are maintained. No evidence of acute fracture. Osteopenia. Soft tissues and spinal canal: No prevertebral fluid or swelling. No visible canal hematoma. Disc levels: Multilevel facet uncovertebral hypertrophy. Multilevel degenerative disc disease, greatest at C6-C7. Upper chest: Visualized lung apices are clear. IMPRESSION: 1. No evidence of acute abnormality intracranially or in the cervical spine. 2. Small high left scalp contusion. Electronically Signed   By: Feliberto Harts M.D.   On: 07/31/2023 16:37   CT Cervical Spine Wo Contrast Result Date: 07/31/2023 CLINICAL DATA:  Polytrauma, blunt EXAM: CT HEAD WITHOUT CONTRAST CT CERVICAL SPINE WITHOUT CONTRAST TECHNIQUE: Multidetector CT imaging of the head and cervical spine was performed following the standard protocol without intravenous contrast. Multiplanar CT image reconstructions of the cervical spine were also generated. RADIATION DOSE REDUCTION: This exam was performed  according to the departmental dose-optimization program which includes automated exposure control, adjustment of the mA and/or kV according to patient size and/or use of iterative reconstruction technique. COMPARISON:  CT head 07/12/2018. FINDINGS: CT HEAD FINDINGS Brain: No evidence of acute infarction, hemorrhage, hydrocephalus, extra-axial collection or mass lesion/mass effect. Vascular: No hyperdense vessel. Skull: No acute fracture.  Small high left scalp contusion. Sinuses/Orbits: Clear sinuses.  No acute orbital findings. Other: No mastoid effusions. CT CERVICAL SPINE FINDINGS Alignment: Approximately 2 mm of anterolisthesis of C4 on C5, likely degenerative given severe facet arthropathy on the left at this level. Mild broad levocurvature. Skull base and vertebrae: Vertebral body heights are maintained. No evidence of acute fracture. Osteopenia. Soft tissues and spinal canal: No prevertebral fluid or swelling. No visible canal hematoma. Disc levels: Multilevel facet uncovertebral hypertrophy. Multilevel degenerative disc disease, greatest at C6-C7. Upper chest: Visualized lung apices are clear. IMPRESSION: 1. No evidence of acute abnormality intracranially or in the cervical spine. 2. Small high left scalp contusion. Electronically Signed   By: Feliberto Harts M.D.   On: 07/31/2023 16:37   CT T-SPINE NO CHARGE Result Date: 07/31/2023 CLINICAL DATA:  Fall, syncopal episode EXAM: CT THORACIC SPINE WITHOUT CONTRAST TECHNIQUE: Multidetector CT images of the thoracic were obtained using the standard protocol without intravenous contrast. RADIATION DOSE REDUCTION: This exam was performed according to the departmental dose-optimization program which includes automated exposure control, adjustment of the mA and/or kV according to patient size and/or use of iterative reconstruction technique. COMPARISON:  07/27/2018 MRI thoracic spine, 07/12/2018 CT chest no prior  dedicated CT of the thoracic spine FINDINGS:  Alignment: Exaggeration of the normal thoracic kyphosis. Trace anterolisthesis of C7 on T1. No other significant listhesis. Vertebrae: No acute fracture or suspicious osseous lesion. Chronic mild wedging of T7, T8, and T9. 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 For additional findings in the thorax, including rib findings, please see same-day CT chest. Disc levels: No significant spinal canal stenosis or neural foraminal narrowing. IMPRESSION: No acute fracture or traumatic listhesis in the thoracic spine. Electronically Signed   By: Wiliam Ke M.D.   On: 07/31/2023 16:37    Assessment/Plan  1. Influenza A (Primary) -   Out of window for initiation of Tamiflu -Procalcitonin negative, discontinued antibiotics which were initiated -No shortness of breath  2. Syncope and collapse -   Thought to be due to progressive influenza A symptoms, poor oral intake and dehydration -   Was given IV fluids for hydration -   Initially with sinus bradycardia in the 50s and heart rate improved and started getting mild sinus tachycardia, low-dose beta-blockers given -   2D echo ordered on 2/8 with LVEF 60 to 65% grade 1 diastolic dysfunction  3. Fall, sequela -   Sustained 8 left rib fractures -   CT head and C-spine negative for abnormalities -   CT chest with abnormal findings with questionable bronchiectasis but procalcitonin negative, no antibiotics indicated -    Fall precautions  4. Atrial flutter, unspecified type [facility]) -   Cardiology consulted and recommended low-dose metoprolol succinate 12.5 mg daily and low-dose apixaban 2.5 mg twice a day -Heart monitor started  prior to discharge -   Follow-up with cardiology  5. Closed fracture of multiple ribs of left side, sequela -  3, 4, 6, 7, 8, 9, 10 and 11th left rib acute  fractures, Follow-up chest x-ray on 2/8 without evidence of pneumothorax-     -   Trauma consulted and recommended use of incentive spirometry -   Continue methocarbamol 500 mg 3 times a day, gabapentin 100 mg daily and 2 lidocaine patches 5% daily  6. Essential hypertension -  Continue amlodipine 2.5 mg daily and Toprol-XL 12.5 mg daily -   Monitor BP  7. Hyperlipidemia with target LDL less than 70 Lab Results  Component Value Date   CHOL 147 07/30/2018   HDL 51 07/30/2018   LDLCALC 79 07/30/2018   TRIG 85 07/30/2018   CHOLHDL 2.9 07/30/2018    -   Continue atorvastatin 10 mg daily and Zetia 10 mg daily  8. Gastroesophageal reflux disease without esophagitis -   Continue omeprazole 20 mg daily    Family/ staff Communication: Discussed plan of care with resident and charge nurse.  Labs/tests ordered:  for CBC and BMP    Kenard Gower, DNP, MSN, FNP-BC [facility] [phone] (Monday-Friday 8:00 a.m. - 5:00 p.m.) [phone] (after hours)

## 2023-08-05 NOTE — Progress Notes (Unsigned)
Location:  [facility] Room Number: 66 A Place of Service:  SNF (31) Provider:  Kenard Gower, DNP, FNP-BC  Patient Care Team: Marella Bile as PCP - General (Physician Assistant) Thurmon Fair, MD as PCP - Cardiology (Cardiology)  Extended Emergency Contact Information Primary Emergency Contact: Kolbeck,[name] Address: [address]          [place], [place] [address] [name] of [place] Home Phone: [phone] Mobile Phone: [phone] Relation: Relative Secondary Emergency Contact: Maurine Minister, [place] Home Phone: [phone] Mobile Phone: [phone] Relation: Daughter  Code Status:  Limited DNR  Goals of care: Advanced Directive information    07/31/2023   10:00 PM  Advanced Directives  Does Patient Have a Medical Advance Directive? Yes  Type of Estate agent of [name];Living will  Does patient want to make changes to medical advance directive? No - Patient declined     Chief Complaint  Patient presents with   Acute Visit    Follow up hospitalization 07/31/23 to 08/04/23    HPI:  Pt is a 89 y.o. female seen today for follow up of hospitalization 07/31/23 to 08/04/23.   Past Medical History:  Diagnosis Date   Complication of anesthesia    Essential hypertension    GERD (gastroesophageal reflux disease)    Glaucoma    Hernia    Hyperlipemia    Myocardial infarction (HCC)    NSTEMI 04/24/18 (40% D2 by LHC; Dx: Takotsubo CM vs coronary spasm vs myocarditis)   PONV (postoperative nausea and vomiting)    Past Surgical History:  Procedure Laterality Date   CARDIAC CATHETERIZATION  04/05/03   CHOLECYSTECTOMY N/A 08/17/2018   Procedure: Laparoscopic Cholecystectomy;  Surgeon: Harriette Bouillon, MD;  Location: [facility] OR;  Service: General;  Laterality: N/A;   KNEE ARTHROSCOPY  09/17/05   left   LEFT HEART CATH AND CORONARY ANGIOGRAPHY N/A 04/26/2018   Procedure: LEFT HEART CATH AND  CORONARY ANGIOGRAPHY;  Surgeon: Marykay Lex, MD;  Location: [facility] INVASIVE CV LAB;  Service: Cardiovascular;  Laterality: N/A;   VAGINAL HYSTERECTOMY  1980's    Allergies  Allergen Reactions   Dicyclomine Itching, Nausea Only, Rash and Other (See Comments)    Stomach upset   Morphine Sulfate Nausea And Vomiting   Mupirocin    Statins Other (See Comments)    Caused aching   Losartan Potassium Other (See Comments)    Caused stomach upset   Morphine And Codeine Other (See Comments)    Acid reflux, sick on stomach   Sulfa Antibiotics Nausea And Vomiting and Other (See Comments)    GI Intolerance   Butalbital-Asa-Caff-Codeine     Other Reaction(s): vomiting, vomiting, vomiting   Codeine Nausea Only    Other Reaction(s): GI Intolerance, Not available, vomiting   Elemental Sulfur Nausea Only   Losartan Nausea Only    Outpatient Encounter Medications as of 08/05/2023  Medication Sig   acetaminophen (TYLENOL) 500 MG tablet Take 2 tablets (1,000 mg total) by mouth 3 (three) times daily. Continue 1 g 3 times daily for 5 to 7 days and then consider changing to 1 g 3 times daily as needed for pain.   amLODipine (NORVASC) 5 MG tablet Take 0.5 tablets (2.5 mg total) by mouth at bedtime.   apixaban (ELIQUIS) 2.5 MG TABS tablet Take 1 tablet (2.5 mg total) by mouth 2 (two) times daily.   atorvastatin (LIPITOR) 10 MG tablet Take 10 mg  by mouth every other day.   brinzolamide (AZOPT) 1 % ophthalmic suspension Place 1 drop into both eyes 2 (two) times daily.    cephALEXin (KEFLEX) 250 MG capsule Take 250 mg by mouth at bedtime.   Cholecalciferol (VITAMIN D3) 125 MCG (5000 UT) CAPS Take 1 capsule by mouth daily.   estradiol (ESTRACE) 0.1 MG/GM vaginal cream Place 1 Applicatorful vaginally 2 (two) times a week.   ezetimibe (ZETIA) 10 MG tablet Take 10 mg by mouth daily.   gabapentin (NEURONTIN) 100 MG capsule Take 100 mg by mouth daily.   lidocaine (LIDODERM) 5 % Place 2 patches onto the skin daily.  Remove & Discard patch within 12 hours or as directed by MD.  Apply to left anterolateral chest wall at site of maximal patient reported pain.   methocarbamol (ROBAXIN) 500 MG tablet Take 1 tablet (500 mg total) by mouth 3 (three) times daily. Take 500 Mg 3 times daily x 5 to 7 days then recommend changing to 500 Mg 3 times daily as needed for muscle spasms.   metoprolol succinate (TOPROL-XL) 25 MG 24 hr tablet Take 0.5 tablets (12.5 mg total) by mouth daily. SCHEDULE OFFICE VISIT FOR FUTURE REFILLS   nitroGLYCERIN (NITROSTAT) 0.4 MG SL tablet Place 1 tablet (0.4 mg total) under the tongue every 5 (five) minutes x 3 doses as needed for chest pain.   omeprazole (PRILOSEC) 20 MG capsule Take 20 mg by mouth daily.   polyethylene glycol (MIRALAX / GLYCOLAX) 17 g packet Take 17 g by mouth daily.   senna-docusate (SENOKOT-S) 8.6-50 MG tablet Take 1 tablet by mouth 2 (two) times daily.   No facility-administered encounter medications on file as of 08/05/2023.    Review of Systems  Constitutional:  Negative for appetite change, chills, fatigue and fever.  HENT:  Negative for congestion, hearing loss, rhinorrhea and sore throat.   Eyes: Negative.   Respiratory:  Negative for cough, shortness of breath and wheezing.   Cardiovascular:  Negative for chest pain, palpitations and leg swelling.  Gastrointestinal:  Negative for abdominal pain, constipation, diarrhea, nausea and vomiting.  Genitourinary:  Negative for dysuria.  Musculoskeletal:  Negative for arthralgias, back pain and myalgias.  Skin:  Negative for color change, rash and wound.  Neurological:  Negative for dizziness, weakness and headaches.  Psychiatric/Behavioral:  Negative for behavioral problems. The patient is not nervous/anxious.    ***    Immunization History  Administered Date(s) Administered   Influenza, High Dose Seasonal PF 03/26/2019   Moderna Covid-19 Fall Seasonal Vaccine 89yrs & older 03/21/2022   PFIZER Comirnaty(Gray  Top)Covid-19 Tri-Sucrose Vaccine 10/17/2020   PFIZER(Purple Top)SARS-COV-2 Vaccination 07/17/2019, 08/08/2019   Pfizer Covid-19 Vaccine Bivalent Booster 41yrs & up 03/12/2021   Pertinent  Health Maintenance Due  Topic Date Due   DEXA SCAN  Never done   INFLUENZA VACCINE  Completed      04/27/2018    7:05 PM 04/28/2018    8:00 AM 08/17/2018   10:33 AM 12/21/2019   11:31 AM 01/16/2021    8:27 AM  Fall Risk  (RETIRED) Patient Fall Risk Level Moderate fall risk Moderate fall risk High fall risk Low fall risk Low fall risk     Vitals:   08/05/23 1727  BP: (!) 147/83  Pulse: 69  Resp: 20  Temp: (!) 97.1 F (36.2 C)  SpO2: 92%  Weight: 131 lb 2.8 oz (59.5 kg)  Height: 5' 2.99" (1.6 m)   Body mass index is 23.24 kg/m.  Physical  Exam Constitutional:      Appearance: Normal appearance.  HENT:     Head: Normocephalic and atraumatic.     Nose: Nose normal.     Mouth/Throat:     Mouth: Mucous membranes are moist.  Eyes:     Conjunctiva/sclera: Conjunctivae normal.  Cardiovascular:     Rate and Rhythm: Normal rate and regular rhythm.  Pulmonary:     Effort: Pulmonary effort is normal.     Breath sounds: Normal breath sounds.  Abdominal:     General: Bowel sounds are normal.     Palpations: Abdomen is soft.  Musculoskeletal:        General: Normal range of motion.     Cervical back: Normal range of motion.  Skin:    General: Skin is warm and dry.  Neurological:     General: No focal deficit present.     Mental Status: She is alert and oriented to person, place, and time.  Psychiatric:        Mood and Affect: Mood normal.        Behavior: Behavior normal.        Thought Content: Thought content normal.        Judgment: Judgment normal.        Labs reviewed: Recent Labs    07/31/23 1540 08/01/23 0410 08/03/23 0536 08/04/23 0530  NA 137 138 136 134*  K 3.6 3.5 3.1* 4.4  CL 101 106 97* 99  CO2 26 24 22 23   GLUCOSE 129* 113* 112* 103*  BUN 27* 23 10 16    CREATININE 1.05* 0.94 0.81 0.90  CALCIUM 9.2 9.1 9.1 8.5*  MG 2.1  --  1.5* 2.5*   Recent Labs    08/03/23 0536  AST 27  ALT 17  ALKPHOS 42  BILITOT 0.9  PROT 5.8*  ALBUMIN 3.1*   Recent Labs    07/31/23 1540 08/01/23 0410 08/03/23 0536  WBC 9.4 4.3 5.1  HGB 15.0 12.4 14.0  HCT 46.0 37.4 41.8  MCV 101.5* 99.7 97.7  PLT 188 160 180   Lab Results  Component Value Date   TSH 0.712 08/01/2023   Lab Results  Component Value Date   HGBA1C 5.6 04/24/2018   Lab Results  Component Value Date   CHOL 147 07/30/2018   HDL 51 07/30/2018   LDLCALC 79 07/30/2018   TRIG 85 07/30/2018   CHOLHDL 2.9 07/30/2018    Significant Diagnostic Results in last 30 days:  ECHOCARDIOGRAM COMPLETE Result Date: 08/01/2023    ECHOCARDIOGRAM REPORT   Patient Name:   Julie Petty Date of Exam: 08/01/2023 Medical Rec #:  [id number]         Height:       63.0 in Accession #:    [id number]        Weight:       131.2 lb Date of Birth:  November 29, 1931         BSA:          1.616 m Patient Age:    89 years          BP:           149/71 mmHg Patient Gender: F                 HR:           56 bpm. Exam Location:  Inpatient Procedure: 2D Echo, Cardiac Doppler and Color Doppler Indications:    R55 Syncope  History:  Patient has prior history of Echocardiogram examinations, most                 recent 04/25/2018. Cardiomyopathy, Previous Myocardial Infarction                 and CAD; Risk Factors:Hypertension and Dyslipidemia. FLU                 positive. Pneumothorax.  Sonographer:    Sheralyn Boatman RDCS Referring Phys: [id number] ANAND D HONGALGI  Sonographer Comments: Delay to get into bed. IMPRESSIONS  1. Left ventricular ejection fraction, by estimation, is 60 to 65%. The left ventricle has normal function. The left ventricle has no regional wall motion abnormalities. Left ventricular diastolic parameters are consistent with Grade I diastolic dysfunction (impaired relaxation).  2. Right ventricular systolic function is  normal. The right ventricular size is normal. There is mildly elevated pulmonary artery systolic pressure. The estimated right ventricular systolic pressure is 42.4 mmHg.  3. The mitral valve is normal in structure. Trivial mitral valve regurgitation. No evidence of mitral stenosis.  4. The aortic valve is tricuspid. There is mild calcification of the aortic valve. Aortic valve regurgitation is mild. No aortic stenosis is present. Aortic regurgitation PHT measures 689 msec.  5. The inferior vena cava is normal in size with greater than 50% respiratory variability, suggesting right atrial pressure of 3 mmHg. FINDINGS  Left Ventricle: Left ventricular ejection fraction, by estimation, is 60 to 65%. The left ventricle has normal function. The left ventricle has no regional wall motion abnormalities. The left ventricular internal cavity size was normal in size. There is  no left ventricular hypertrophy. Left ventricular diastolic parameters are consistent with Grade I diastolic dysfunction (impaired relaxation). Normal left ventricular filling pressure. Right Ventricle: The right ventricular size is normal. No increase in right ventricular wall thickness. Right ventricular systolic function is normal. There is mildly elevated pulmonary artery systolic pressure. The tricuspid regurgitant velocity is 3.14  m/s, and with an assumed right atrial pressure of 3 mmHg, the estimated right ventricular systolic pressure is 42.4 mmHg. Left Atrium: Left atrial size was normal in size. Right Atrium: Right atrial size was normal in size. Pericardium: There is no evidence of pericardial effusion. Mitral Valve: The mitral valve is normal in structure. Trivial mitral valve regurgitation. No evidence of mitral valve stenosis. Tricuspid Valve: The tricuspid valve is normal in structure. Tricuspid valve regurgitation is mild . No evidence of tricuspid stenosis. Aortic Valve: The aortic valve is tricuspid. There is mild calcification of the  aortic valve. Aortic valve regurgitation is mild. Aortic regurgitation PHT measures 689 msec. No aortic stenosis is present. Pulmonic Valve: The pulmonic valve was not well visualized. Pulmonic valve regurgitation is not visualized. No evidence of pulmonic stenosis. Aorta: The aortic root and ascending aorta are structurally normal, with no evidence of dilitation. Venous: The inferior vena cava is normal in size with greater than 50% respiratory variability, suggesting right atrial pressure of 3 mmHg. IAS/Shunts: No atrial level shunt detected by color flow Doppler.  LEFT VENTRICLE PLAX 2D LVIDd:         3.40 cm     Diastology LVIDs:         1.90 cm     LV e' medial:    7.94 cm/s LV PW:         1.30 cm     LV E/e' medial:  12.4 LV IVS:        0.90 cm  LV e' lateral:   9.03 cm/s LVOT diam:     2.40 cm     LV E/e' lateral: 10.9 LV SV:         141 LV SV Index:   87 LVOT Area:     4.52 cm  LV Volumes (MOD) LV vol d, MOD A2C: 51.8 ml LV vol d, MOD A4C: 55.8 ml LV vol s, MOD A2C: 18.7 ml LV vol s, MOD A4C: 16.4 ml LV SV MOD A2C:     33.1 ml LV SV MOD A4C:     55.8 ml LV SV MOD BP:      36.3 ml RIGHT VENTRICLE             IVC RV S prime:     12.10 cm/s  IVC diam: 2.00 cm TAPSE (M-mode): 2.0 cm LEFT ATRIUM             Index        RIGHT ATRIUM           Index LA diam:        3.70 cm 2.29 cm/m   RA Area:     14.00 cm LA Vol (A2C):   42.6 ml 26.36 ml/m  RA Volume:   32.40 ml  20.05 ml/m LA Vol (A4C):   37.4 ml 23.14 ml/m LA Biplane Vol: 42.1 ml 26.05 ml/m  AORTIC VALVE LVOT Vmax:   126.00 cm/s LVOT Vmean:  81.400 cm/s LVOT VTI:    0.311 m AI PHT:      689 msec  AORTA Ao Root diam: 3.40 cm Ao Asc diam:  3.30 cm MITRAL VALVE                TRICUSPID VALVE MV Area (PHT): 3.42 cm     TR Peak grad:   39.4 mmHg MV Decel Time: 222 msec     TR Vmax:        314.00 cm/s MV E velocity: 98.50 cm/s MV A velocity: 103.00 cm/s  SHUNTS MV E/A ratio:  0.96         Systemic VTI:  0.31 m                             Systemic Diam:  2.40 cm Vishnu Priya Mallipeddi Electronically signed by Winfield Rast Mallipeddi Signature Date/Time: 08/01/2023/4:07:16 PM    Final    DG Chest 2 View Result Date: 08/01/2023 CLINICAL DATA:  Pneumothorax. EXAM: CHEST - 2 VIEW COMPARISON:  12/21/2019.  Chest CT 07/31/2023. FINDINGS: The cardio pericardial silhouette is enlarged. There is pulmonary vascular congestion without overt pulmonary edema. Interstitial markings are diffusely coarsened with chronic features. The trace anterior pneumothorax seen on CT scan yesterday is not evident by x-ray today. Multiple left-sided rib fractures evident, better characterized on yesterday's CT scan. Bones are diffusely demineralized. Telemetry leads overlie the chest. IMPRESSION: 1. The trace anterior pneumothorax seen on CT scan yesterday is not evident by x-ray today. 2. Multiple left-sided rib fractures, better characterized on yesterday's CT scan. Electronically Signed   By: Kennith Center M.D.   On: 08/01/2023 07:38   CT Chest Wo Contrast Addendum Date: 07/31/2023 ADDENDUM REPORT: 07/31/2023 17:45 ADDENDUM: The original report was by Dr. Gaylyn Rong. The following addendum is by Dr. Gaylyn Rong: Critical Value/emergent results were called by telephone at the time of interpretation on 07/31/2023 at 5:34 pm to provider MATTHEW [facility] , who verbally acknowledged these results. Electronically  Signed   By: Gaylyn Rong M.D.   On: 07/31/2023 17:45   Result Date: 07/31/2023 CLINICAL DATA:  Chest trauma, clinically suspected left rib fractures. Fall, syncope. EXAM: CT CHEST WITHOUT CONTRAST TECHNIQUE: Multidetector CT imaging of the chest was performed following the standard protocol without IV contrast. RADIATION DOSE REDUCTION: This exam was performed according to the departmental dose-optimization program which includes automated exposure control, adjustment of the mA and/or kV according to patient size and/or use of iterative reconstruction technique.  COMPARISON:  Chest radiograph 12/21/2019 and chest CT 07/12/2018 FINDINGS: Cardiovascular: Coronary, aortic arch, and branch vessel atherosclerotic vascular disease. Mild cardiomegaly. Mediastinum/Nodes: Unremarkable Lungs/Pleura: Biapical pleuroparenchymal scarring. Airway thickening noted with cylindrical bronchiectasis in the right middle lobe and airway plugging in the right middle lobe and both lower lobes. Clustered reticulonodular opacities in the right middle lobe posteriorly are increased from prior but still favor atypical infectious bronchiolitis based on morphology. Dependent atelectasis noted in both lower lobes. Airway plugging noted in the left upper lobe. There is a 2% or less left pneumothorax noted best seen anterior to the left pericardium. Trace left hemothorax. Upper Abdomen: Abdominal aortic atherosclerosis, including substantial atheromatous plaque at the origins of the celiac trunk and superior mesenteric artery. Musculoskeletal: Acute segmental fractures of the left third, fourth, sixth, seventh, eighth, and tenth ribs noted. Additional acute fractures of the left ninth and eleventh ribs noted. Thoracic kyphosis. IMPRESSION: 1. Acute segmental fractures of the left third, fourth, sixth, seventh, eighth, and tenth ribs. Additional acute fractures of the left ninth and eleventh ribs. 2. There is a 2% or less left pneumothorax noted best seen anterior to the left pericardium. Trace left hemothorax. 3. Airway thickening with cylindrical bronchiectasis in the right middle lobe and airway plugging in the right middle lobe and both lower lobes. Clustered reticulonodular opacities in the right middle lobe posteriorly are increased from prior but still favor atypical infectious bronchiolitis based on morphology. 4. Dependent atelectasis in both lower lobes. 5. Mild cardiomegaly. 6. Coronary, aortic arch, and branch vessel atherosclerotic vascular disease. 7.  Aortic Atherosclerosis (ICD10-I70.0).  Radiology assistant personnel have been notified to put me in telephone contact with the referring physician or the referring physician's clinical representative in order to discuss these findings. Once this communication is established I will issue an addendum to this report for documentation purposes. Electronically Signed: By: Gaylyn Rong M.D. On: 07/31/2023 17:29   CT Head Wo Contrast Result Date: 07/31/2023 CLINICAL DATA:  Polytrauma, blunt EXAM: CT HEAD WITHOUT CONTRAST CT CERVICAL SPINE WITHOUT CONTRAST TECHNIQUE: Multidetector CT imaging of the head and cervical spine was performed following the standard protocol without intravenous contrast. Multiplanar CT image reconstructions of the cervical spine were also generated. RADIATION DOSE REDUCTION: This exam was performed according to the departmental dose-optimization program which includes automated exposure control, adjustment of the mA and/or kV according to patient size and/or use of iterative reconstruction technique. COMPARISON:  CT head 07/12/2018. FINDINGS: CT HEAD FINDINGS Brain: No evidence of acute infarction, hemorrhage, hydrocephalus, extra-axial collection or mass lesion/mass effect. Vascular: No hyperdense vessel. Skull: No acute fracture.  Small high left scalp contusion. Sinuses/Orbits: Clear sinuses.  No acute orbital findings. Other: No mastoid effusions. CT CERVICAL SPINE FINDINGS Alignment: Approximately 2 mm of anterolisthesis of C4 on C5, likely degenerative given severe facet arthropathy on the left at this level. Mild broad levocurvature. Skull base and vertebrae: Vertebral body heights are maintained. No evidence of acute fracture. Osteopenia. Soft tissues and spinal canal: No  prevertebral fluid or swelling. No visible canal hematoma. Disc levels: Multilevel facet uncovertebral hypertrophy. Multilevel degenerative disc disease, greatest at C6-C7. Upper chest: Visualized lung apices are clear. IMPRESSION: 1. No evidence of  acute abnormality intracranially or in the cervical spine. 2. Small high left scalp contusion. Electronically Signed   By: Feliberto Harts M.D.   On: 07/31/2023 16:37   CT Cervical Spine Wo Contrast Result Date: 07/31/2023 CLINICAL DATA:  Polytrauma, blunt EXAM: CT HEAD WITHOUT CONTRAST CT CERVICAL SPINE WITHOUT CONTRAST TECHNIQUE: Multidetector CT imaging of the head and cervical spine was performed following the standard protocol without intravenous contrast. Multiplanar CT image reconstructions of the cervical spine were also generated. RADIATION DOSE REDUCTION: This exam was performed according to the departmental dose-optimization program which includes automated exposure control, adjustment of the mA and/or kV according to patient size and/or use of iterative reconstruction technique. COMPARISON:  CT head 07/12/2018. FINDINGS: CT HEAD FINDINGS Brain: No evidence of acute infarction, hemorrhage, hydrocephalus, extra-axial collection or mass lesion/mass effect. Vascular: No hyperdense vessel. Skull: No acute fracture.  Small high left scalp contusion. Sinuses/Orbits: Clear sinuses.  No acute orbital findings. Other: No mastoid effusions. CT CERVICAL SPINE FINDINGS Alignment: Approximately 2 mm of anterolisthesis of C4 on C5, likely degenerative given severe facet arthropathy on the left at this level. Mild broad levocurvature. Skull base and vertebrae: Vertebral body heights are maintained. No evidence of acute fracture. Osteopenia. Soft tissues and spinal canal: No prevertebral fluid or swelling. No visible canal hematoma. Disc levels: Multilevel facet uncovertebral hypertrophy. Multilevel degenerative disc disease, greatest at C6-C7. Upper chest: Visualized lung apices are clear. IMPRESSION: 1. No evidence of acute abnormality intracranially or in the cervical spine. 2. Small high left scalp contusion. Electronically Signed   By: Feliberto Harts M.D.   On: 07/31/2023 16:37   CT T-SPINE NO  CHARGE Result Date: 07/31/2023 CLINICAL DATA:  Fall, syncopal episode EXAM: CT THORACIC SPINE WITHOUT CONTRAST TECHNIQUE: Multidetector CT images of the thoracic were obtained using the standard protocol without intravenous contrast. RADIATION DOSE REDUCTION: This exam was performed according to the departmental dose-optimization program which includes automated exposure control, adjustment of the mA and/or kV according to patient size and/or use of iterative reconstruction technique. COMPARISON:  07/27/2018 MRI thoracic spine, 07/12/2018 CT chest no prior dedicated CT of the thoracic spine FINDINGS: Alignment: Exaggeration of the normal thoracic kyphosis. Trace anterolisthesis of C7 on T1. No other significant listhesis. Vertebrae: No acute fracture or suspicious osseous lesion. Chronic mild wedging of T7, T8, and T9. 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 For additional findings in the thorax, including rib findings, please see same-day CT chest. Disc levels: No significant spinal canal stenosis or neural foraminal narrowing. IMPRESSION: No acute fracture or traumatic listhesis in the thoracic spine. Electronically Signed   By: Wiliam Ke M.D.   On: 07/31/2023 16:37    Assessment/Plan ***   Family/ staff Communication: Discussed plan of care with resident and charge nurse  Labs/tests ordered:     Kenard Gower, DNP, MSN, FNP-BC [facility] [phone] (Monday-Friday 8:00 a.m. - 5:00 p.m.) [phone] (after hours)

## 2023-08-06 ENCOUNTER — Non-Acute Institutional Stay (SKILLED_NURSING_FACILITY): Payer: Self-pay | Admitting: Sports Medicine

## 2023-08-06 ENCOUNTER — Encounter: Payer: Self-pay | Admitting: Sports Medicine

## 2023-08-06 DIAGNOSIS — R110 Nausea: Secondary | ICD-10-CM

## 2023-08-06 DIAGNOSIS — Z87440 Personal history of urinary (tract) infections: Secondary | ICD-10-CM

## 2023-08-06 DIAGNOSIS — S2242XS Multiple fractures of ribs, left side, sequela: Secondary | ICD-10-CM

## 2023-08-06 DIAGNOSIS — I4891 Unspecified atrial fibrillation: Secondary | ICD-10-CM

## 2023-08-06 DIAGNOSIS — K219 Gastro-esophageal reflux disease without esophagitis: Secondary | ICD-10-CM

## 2023-08-06 DIAGNOSIS — K5900 Constipation, unspecified: Secondary | ICD-10-CM

## 2023-08-06 DIAGNOSIS — R051 Acute cough: Secondary | ICD-10-CM

## 2023-08-06 NOTE — Progress Notes (Signed)
Provider:  Dr. Venita Sheffield Location:  [facility] Place of Service:   Skilled care   PCP: Dani Gobble, PA-C Patient Care Team: Marella Bile as PCP - General (Physician Assistant) Thurmon Fair, MD as PCP - Cardiology (Cardiology)  Extended Emergency Contact Information Primary Emergency Contact: Neighbors,[name] Address: [address]          [place], [place] [address] [name] of [place] Home Phone: [phone] Mobile Phone: [phone] Relation: Relative Secondary Emergency Contact: Maurine Minister, [place] Home Phone: [phone] Mobile Phone: [phone] Relation: Daughter  Goals of Care: Advanced Directive information    07/31/2023   10:00 PM  Advanced Directives  Does Patient Have a Medical Advance Directive? Yes  Type of Estate agent of [place];Living will  Does patient want to make changes to medical advance directive? No - Patient declined      No chief complaint on file.   89 yr old F with h/o hypertension, hyperlipidemia, GERD, mild CAD by cardiac cath in 2019, Takotsubo syndrome with complete recovery, recurrent UTIs on Keflex for prophylaxis is seen today for admission to skilled care facility.   As per d/c summary  ''  89 year old female, lives alone at an independent living facility, independent and still drives, medical history significant for HTN, HLD, GERD, mild CAD by cardiac cath in 2019, Takotsubo syndrome with complete recovery, recurrent UTIs on Keflex, presented to the [facility] ED via EMS following an episode of syncope, fall, head and left-sided chest pain.  She was seen at an urgent care on 2/4 for URTI symptoms, diagnosed with possible viral illness, reports that her symptoms did not get better with the 2 medications she was given, was not given Tamiflu).  She had ongoing persistent cough, fatigue, runny nose with progressive weakness for 3 to 4 days PTA.  On day  of admission, while attempting to get water from her kitchen sink, felt lightheaded, passed out, fell hitting her head and left forearm and left side of her chest.  She states that she may have passed out for about 2 minutes, then was able to call for assistance.  Admitted for confirmed influenza A, several (about 8) left-sided rib fractures, tiny left pneumothorax (resolved on follow-up chest x-ray).  Seen by general surgery at [facility] who recommended transfer to [facility] for evaluation by their trauma colleagues.  Pain appears to be adequately controlled.  Telemetry showed concerns for intermittent A-fib/atrial flutter with RVR.  Cardiology was consulted. ''   Pt seen and examined in her room  Pt reports that she does not feel good since this morning.  Feeling nauseous and constipation  She had a small bowel movement yesterday  Denies abdominal pain, dysuria. Pt worked with therapy yesterday and felt good.  Pt has recent flu, still with persistent dry cough Reports that it hurts in her chest every time she coughs Denies sob  Denies orthopnea, PND, lower extremity swelling   GERD C/o acid relfux Denies dark stools On omeprazole 20 mg daily   Left rib fractures Pt reports that her pain is better She is trying to incentive spirometry    H/o Syncope Intermittent Afib  Pt denies chest pain, palpitations, dizzy or lightheadedness She is wearing zio patch      Past Medical History:  Diagnosis Date   Complication of anesthesia    Essential hypertension    GERD (gastroesophageal reflux disease)    Glaucoma  Hernia    Hyperlipemia    Myocardial infarction [facility])    NSTEMI 04/24/18 (40% D2 by LHC; Dx: Takotsubo CM vs coronary spasm vs myocarditis)   PONV (postoperative nausea and vomiting)    Past Surgical History:  Procedure Laterality Date   CARDIAC CATHETERIZATION  04/05/03   CHOLECYSTECTOMY N/A 08/17/2018   Procedure: Laparoscopic Cholecystectomy;  Surgeon: Harriette Bouillon, MD;   Location: [facility] OR;  Service: General;  Laterality: N/A;   KNEE ARTHROSCOPY  09/17/05   left   LEFT HEART CATH AND CORONARY ANGIOGRAPHY N/A 04/26/2018   Procedure: LEFT HEART CATH AND CORONARY ANGIOGRAPHY;  Surgeon: Marykay Lex, MD;  Location: [facility] INVASIVE CV LAB;  Service: Cardiovascular;  Laterality: N/A;   VAGINAL HYSTERECTOMY  1980's    reports that she has never smoked. She has never used smokeless tobacco. She reports that she does not drink alcohol and does not use drugs. Social History   Socioeconomic History   Marital status: Divorced    Spouse name: Not on file   Number of children: Not on file   Years of education: Not on file   Highest education level: Not on file  Occupational History   Not on file  Tobacco Use   Smoking status: Never   Smokeless tobacco: Never  Vaping Use   Vaping status: Never Used  Substance and Sexual Activity   Alcohol use: No   Drug use: Never   Sexual activity: Never  Other Topics Concern   Not on file  Social History Narrative   Not on file   Social Drivers of Health   Financial Resource Strain: Low Risk  (07/14/2023)   Received from [facility]   Overall Financial Resource Strain (CARDIA)    Difficulty of Paying Living Expenses: Not very hard  Food Insecurity: No Food Insecurity (07/31/2023)   Hunger Vital Sign    Worried About Running Out of Food in the Last Year: Never true    Ran Out of Food in the Last Year: Never true  Transportation Needs: No Transportation Needs (07/31/2023)   PRAPARE - Administrator, Civil Service (Medical): No    Lack of Transportation (Non-Medical): No  Physical Activity: Insufficiently Active (10/09/2022)   [facility]   Exercise Vital Sign    Days of Exercise per Week: 5 days    Minutes of Exercise per Session: 20 min  Stress: No Stress Concern Present (02/15/2023)   Received from [facility] of Occupational Health - Occupational Stress  Questionnaire    Feeling of Stress : Only a little  Social Connections: Moderately Integrated (07/31/2023)   Social Connection and Isolation Panel [NHANES]    Frequency of Communication with Friends and Family: More than three times a week    Frequency of Social Gatherings with Friends and Family: More than three times a week    Attends Religious Services: More than 4 times per year    Active Member of [employer] or Organizations: Yes    Attends Banker Meetings: More than 4 times per year    Marital Status: Divorced  Intimate Partner Violence: Not At Risk (07/31/2023)   Humiliation, Afraid, Rape, and Kick questionnaire    Fear of Current or Ex-Partner: No    Emotionally Abused: No    Physically Abused: No    Sexually Abused: No    Functional Status Survey:    Family History  Problem Relation Age of Onset  Heart attack Brother     Health Maintenance  Topic Date Due   Pneumonia Vaccine 16+ Years old (1 of 2 - PCV) Never done   DTaP/Tdap/Td (1 - Tdap) Never done   DEXA SCAN  Never done   COVID-19 Vaccine (7 - 2024-25 season) 02/22/2023   Medicare Annual Wellness (AWV)  10/09/2023   INFLUENZA VACCINE  Completed   Zoster Vaccines- Shingrix  Completed   HPV VACCINES  Aged Out    Allergies  Allergen Reactions   Dicyclomine Itching, Nausea Only, Rash and Other (See Comments)    Stomach upset   Morphine Sulfate Nausea And Vomiting   Mupirocin    Statins Other (See Comments)    Caused aching   Losartan Potassium Other (See Comments)    Caused stomach upset   Morphine And Codeine Other (See Comments)    Acid reflux, sick on stomach   Sulfa Antibiotics Nausea And Vomiting and Other (See Comments)    GI Intolerance   Butalbital-Asa-Caff-Codeine     Other Reaction(s): vomiting, vomiting, vomiting   Codeine Nausea Only    Other Reaction(s): GI Intolerance, Not available, vomiting   Elemental Sulfur Nausea Only   Losartan Nausea Only    Outpatient Encounter  Medications as of 08/06/2023  Medication Sig   acetaminophen (TYLENOL) 500 MG tablet Take 2 tablets (1,000 mg total) by mouth 3 (three) times daily. Continue 1 g 3 times daily for 5 to 7 days and then consider changing to 1 g 3 times daily as needed for pain.   amLODipine (NORVASC) 5 MG tablet Take 0.5 tablets (2.5 mg total) by mouth at bedtime.   apixaban (ELIQUIS) 2.5 MG TABS tablet Take 1 tablet (2.5 mg total) by mouth 2 (two) times daily.   atorvastatin (LIPITOR) 10 MG tablet Take 10 mg by mouth every other day.   brinzolamide (AZOPT) 1 % ophthalmic suspension Place 1 drop into both eyes 2 (two) times daily.    cephALEXin (KEFLEX) 250 MG capsule Take 250 mg by mouth at bedtime.   Cholecalciferol (VITAMIN D3) 125 MCG (5000 UT) CAPS Take 1 capsule by mouth daily.   estradiol (ESTRACE) 0.1 MG/GM vaginal cream Place 1 Applicatorful vaginally 2 (two) times a week.   ezetimibe (ZETIA) 10 MG tablet Take 10 mg by mouth daily.   gabapentin (NEURONTIN) 100 MG capsule Take 100 mg by mouth daily.   lidocaine (LIDODERM) 5 % Place 2 patches onto the skin daily. Remove & Discard patch within 12 hours or as directed by MD.  Apply to left anterolateral chest wall at site of maximal patient reported pain.   methocarbamol (ROBAXIN) 500 MG tablet Take 1 tablet (500 mg total) by mouth 3 (three) times daily. Take 500 Mg 3 times daily x 5 to 7 days then recommend changing to 500 Mg 3 times daily as needed for muscle spasms.   metoprolol succinate (TOPROL-XL) 25 MG 24 hr tablet Take 0.5 tablets (12.5 mg total) by mouth daily. SCHEDULE OFFICE VISIT FOR FUTURE REFILLS   nitroGLYCERIN (NITROSTAT) 0.4 MG SL tablet Place 1 tablet (0.4 mg total) under the tongue every 5 (five) minutes x 3 doses as needed for chest pain.   omeprazole (PRILOSEC) 20 MG capsule Take 20 mg by mouth daily.   polyethylene glycol (MIRALAX / GLYCOLAX) 17 g packet Take 17 g by mouth daily.   senna-docusate (SENOKOT-S) 8.6-50 MG tablet Take 1 tablet by  mouth 2 (two) times daily.   No facility-administered encounter medications on file as  of 08/06/2023.    Review of Systems  Constitutional:  Negative for chills and fever.  HENT:  Negative for sinus pressure and sore throat.   Respiratory:  Positive for cough. Negative for shortness of breath and wheezing.   Cardiovascular:  Negative for chest pain, palpitations and leg swelling.  Gastrointestinal:  Positive for constipation and nausea. Negative for abdominal distention, abdominal pain, blood in stool, diarrhea and vomiting.  Genitourinary:  Negative for dysuria, frequency and urgency.  Neurological:  Negative for dizziness, weakness and numbness.  Psychiatric/Behavioral:  Negative for confusion.    Negative unless indicated in HPI.  There were no vitals filed for this visit. There is no height or weight on file to calculate BMI. BP Readings from Last 3 Encounters:  08/05/23 (!) 147/83  08/04/23 (!) 172/80  02/03/20 132/78   Wt Readings from Last 3 Encounters:  08/05/23 131 lb 2.8 oz (59.5 kg)  07/31/23 131 lb 2.8 oz (59.5 kg)  02/03/20 137 lb (62.1 kg)   Physical Exam Constitutional:      Appearance: Normal appearance.  HENT:     Head: Normocephalic and atraumatic.  Cardiovascular:     Rate and Rhythm: Normal rate and regular rhythm.  Pulmonary:     Effort: Pulmonary effort is normal. No respiratory distress.     Breath sounds: Normal breath sounds. No wheezing.  Abdominal:     General: Bowel sounds are normal. There is no distension.     Tenderness: There is no abdominal tenderness. There is no guarding or rebound.     Comments:    Musculoskeletal:        General: No swelling or tenderness.  Neurological:     Mental Status: She is alert. Mental status is at baseline.     Sensory: No sensory deficit.     Motor: No weakness.     Labs reviewed: Basic Metabolic Panel: Recent Labs    07/31/23 1540 08/01/23 0410 08/03/23 0536 08/04/23 0530  NA 137 138 136 134*   K 3.6 3.5 3.1* 4.4  CL 101 106 97* 99  CO2 26 24 22 23   GLUCOSE 129* 113* 112* 103*  BUN 27* 23 10 16   CREATININE 1.05* 0.94 0.81 0.90  CALCIUM 9.2 9.1 9.1 8.5*  MG 2.1  --  1.5* 2.5*   Liver Function Tests: Recent Labs    08/03/23 0536  AST 27  ALT 17  ALKPHOS 42  BILITOT 0.9  PROT 5.8*  ALBUMIN 3.1*   No results for input(s): "LIPASE", "AMYLASE" in the last 8760 hours. No results for input(s): "AMMONIA" in the last 8760 hours. CBC: Recent Labs    07/31/23 1540 08/01/23 0410 08/03/23 0536  WBC 9.4 4.3 5.1  HGB 15.0 12.4 14.0  HCT 46.0 37.4 41.8  MCV 101.5* 99.7 97.7  PLT 188 160 180   Cardiac Enzymes: No results for input(s): "CKTOTAL", "CKMB", "CKMBINDEX", "TROPONINI" in the last 8760 hours. BNP: Invalid input(s): "POCBNP" Lab Results  Component Value Date   HGBA1C 5.6 04/24/2018   Lab Results  Component Value Date   TSH 0.712 08/01/2023   No results found for: "VITAMINB12" No results found for: "FOLATE" No results found for: "IRON", "TIBC", "FERRITIN"  Imaging and Procedures obtained prior to SNF admission: No results found.  Assessment and Plan   1. Closed fracture of multiple ribs of left side, sequela (Primary) Cont with tylenol 1000mg  tid Will change robaxin to baclofen  Cont with lidocaine patch  Cont with incentive spirometry  Cont  with PT/OT   2. Atrial fibrillation, unspecified type (HCC) Rate controlled Cont with eliquis Cont with metoprolol  No signs of bleeding  Follow up with cardiology  3. Acute cough Lungs clear Will start tessalon 200 mg bid x 5 days  4. Gastroesophageal reflux disease, unspecified whether esophagitis present Will increase omeprazole to 20 mg bid  5. Nausea Will start zofran  6. Constipation, unspecified constipation type P/a soft , no tenderness on palpation  Will start miralax Will instruct nurse to give enema Cont with colace, senna,  Instructed patient to increase fluid intake   7. History of  recurrent UTIs Denies dysuria Cont with keflex for ppx        40 min Total time spent for obtaining history,  performing a medically appropriate examination and evaluation, reviewing the tests, documenting clinical information in the electronic or other health record,  ,care coordination (not separately reported)

## 2023-08-14 ENCOUNTER — Encounter: Payer: Self-pay | Admitting: Sports Medicine

## 2023-08-14 ENCOUNTER — Non-Acute Institutional Stay (SKILLED_NURSING_FACILITY): Payer: Self-pay | Admitting: Sports Medicine

## 2023-08-14 DIAGNOSIS — I429 Cardiomyopathy, unspecified: Secondary | ICD-10-CM

## 2023-08-14 DIAGNOSIS — R55 Syncope and collapse: Secondary | ICD-10-CM

## 2023-08-14 DIAGNOSIS — S2242XS Multiple fractures of ribs, left side, sequela: Secondary | ICD-10-CM | POA: Diagnosis not present

## 2023-08-14 DIAGNOSIS — I10 Essential (primary) hypertension: Secondary | ICD-10-CM | POA: Diagnosis not present

## 2023-08-14 DIAGNOSIS — E785 Hyperlipidemia, unspecified: Secondary | ICD-10-CM

## 2023-08-14 MED ORDER — BRINZOLAMIDE 1 % OP SUSP: 1.0000 [drp] | Freq: Two times a day (BID) | OPHTHALMIC | 0 refills | Status: AC

## 2023-08-14 MED ORDER — CEPHALEXIN 250 MG PO CAPS: 250.0000 mg | ORAL_CAPSULE | Freq: Every day | ORAL | 0 refills | Status: AC

## 2023-08-14 MED ORDER — AMLODIPINE BESYLATE 5 MG PO TABS: 2.5000 mg | ORAL_TABLET | Freq: Every day | ORAL | 0 refills | Status: DC

## 2023-08-14 MED ORDER — OMEPRAZOLE 20 MG PO CPDR: 20.0000 mg | DELAYED_RELEASE_CAPSULE | Freq: Every day | ORAL | 0 refills | Status: DC

## 2023-08-14 MED ORDER — CEPHALEXIN 250 MG PO CAPS: 250.0000 mg | ORAL_CAPSULE | Freq: Every day | ORAL | Status: DC

## 2023-08-14 MED ORDER — GABAPENTIN 100 MG PO CAPS: 100.0000 mg | ORAL_CAPSULE | Freq: Every day | ORAL | 0 refills | Status: AC

## 2023-08-14 MED ORDER — ATORVASTATIN CALCIUM 10 MG PO TABS: 10.0000 mg | ORAL_TABLET | ORAL | 0 refills | Status: DC

## 2023-08-14 MED ORDER — ESTRADIOL 0.1 MG/GM VA CREA: 1.0000 | TOPICAL_CREAM | VAGINAL | 0 refills | Status: DC

## 2023-08-14 MED ORDER — EZETIMIBE 10 MG PO TABS: 10.0000 mg | ORAL_TABLET | Freq: Every day | ORAL | 0 refills | Status: DC

## 2023-08-14 MED ORDER — METOPROLOL SUCCINATE ER 25 MG PO TB24: 12.5000 mg | ORAL_TABLET | Freq: Every day | ORAL | 0 refills | Status: DC

## 2023-08-14 MED ORDER — APIXABAN 2.5 MG PO TABS: 2.5000 mg | ORAL_TABLET | Freq: Two times a day (BID) | ORAL | 0 refills | Status: DC

## 2023-08-14 NOTE — Progress Notes (Signed)
Provider:  Dr. Venita Sheffield Location:  [facility] Place of Service:   Skilled care   PCP: Dani Gobble, PA-C Patient Care Team: Marella Bile as PCP - General (Physician Assistant) Thurmon Fair, MD as PCP - Cardiology (Cardiology)  Extended Emergency Contact Information Primary Emergency Contact: Frutiger,[name] Address: [address]          [place], [place] [address] [name] of [place] Home Phone: [phone] Mobile Phone: [phone] Relation: Relative Secondary Emergency Contact: Maurine Minister, [place] Home Phone: [phone] Mobile Phone: [phone] Relation: Daughter  Goals of care:  Advanced Directive information    07/31/2023   10:00 PM  Advanced Directives  Does Patient Have a Medical Advance Directive? Yes  Type of Estate agent of [place];Living will  Does patient want to make changes to medical advance directive? No - Patient declined     Allergies  Allergen Reactions   Dicyclomine Itching, Nausea Only, Rash and Other (See Comments)    Stomach upset   Morphine Sulfate Nausea And Vomiting   Mupirocin    Statins Other (See Comments)    Caused aching   Losartan Potassium Other (See Comments)    Caused stomach upset   Morphine And Codeine Other (See Comments)    Acid reflux, sick on stomach   Sulfa Antibiotics Nausea And Vomiting and Other (See Comments)    GI Intolerance   Butalbital-Asa-Caff-Codeine     Other Reaction(s): vomiting, vomiting, vomiting   Codeine Nausea Only    Other Reaction(s): GI Intolerance, Not available, vomiting   Elemental Sulfur Nausea Only   Losartan Nausea Only    No chief complaint on file.     History of Present Illness         89 yr old female, with past medical history significant for HTN, HLD, GERD, mild CAD by cardiac cath in 2019, Takotsubo syndrome with complete recovery, recurrent UTIs on Keflex, was admitted to skilled rehab after  recent hospitalization for syncope. Pt seen and examined  in her room. States she is doing well with therapy, she is able to transfer herself independently and use walker. Denies chest pain, palpitations, SOB, abdominal pain, nausea, vomiting, dysuria, hematuria, bloody or dark stools. Pt c/o passing soft stools 2-3 /day  Recommendations for follow up visit with PCP  Follow up CT chest  Follow up with cardiology, pt wearing zio patch    Past Medical History:  Diagnosis Date   Complication of anesthesia    Essential hypertension    GERD (gastroesophageal reflux disease)    Glaucoma    Hernia    Hyperlipemia    Myocardial infarction (HCC)    NSTEMI 04/24/18 (40% D2 by LHC; Dx: Takotsubo CM vs coronary spasm vs myocarditis)   PONV (postoperative nausea and vomiting)     Past Surgical History:  Procedure Laterality Date   CARDIAC CATHETERIZATION  04/05/03   CHOLECYSTECTOMY N/A 08/17/2018   Procedure: Laparoscopic Cholecystectomy;  Surgeon: Harriette Bouillon, MD;  Location: [facility] OR;  Service: General;  Laterality: N/A;   KNEE ARTHROSCOPY  09/17/05   left   LEFT HEART CATH AND CORONARY ANGIOGRAPHY N/A 04/26/2018   Procedure: LEFT HEART CATH AND CORONARY ANGIOGRAPHY;  Surgeon: Marykay Lex, MD;  Location: [facility] INVASIVE CV LAB;  Service: Cardiovascular;  Laterality: N/A;   VAGINAL HYSTERECTOMY  1980's      reports that she has never smoked. She has never used smokeless tobacco. She  reports that she does not drink alcohol and does not use drugs. Social History   Socioeconomic History   Marital status: Divorced    Spouse name: Not on file   Number of children: Not on file   Years of education: Not on file   Highest education level: Not on file  Occupational History   Not on file  Tobacco Use   Smoking status: Never   Smokeless tobacco: Never  Vaping Use   Vaping status: Never Used  Substance and Sexual Activity   Alcohol use: No   Drug use: Never   Sexual activity: Never   Other Topics Concern   Not on file  Social History Narrative   Not on file   Social Drivers of Health   Financial Resource Strain: Low Risk  (07/14/2023)   Received from [facility]   Overall Financial Resource Strain (CARDIA)    Difficulty of Paying Living Expenses: Not very hard  Food Insecurity: No Food Insecurity (07/31/2023)   Hunger Vital Sign    Worried About Running Out of Food in the Last Year: Never true    Ran Out of Food in the Last Year: Never true  Transportation Needs: No Transportation Needs (07/31/2023)   PRAPARE - Administrator, Civil Service (Medical): No    Lack of Transportation (Non-Medical): No  Physical Activity: Insufficiently Active (10/09/2022)   Received from [facility]   Exercise Vital Sign    Days of Exercise per Week: 5 days    Minutes of Exercise per Session: 20 min  Stress: No Stress Concern Present (02/15/2023)   Received from [facility] - Occupational Stress Questionnaire    Feeling of Stress : Only a little  Social Connections: Moderately Integrated (07/31/2023)   Social Connection and Isolation Panel [NHANES]    Frequency of Communication with Friends and Family: More than three times a week    Frequency of Social Gatherings with Friends and Family: More than three times a week    Attends Religious Services: More than 4 times per year    Active Member of [employer] or Organizations: Yes    Attends Banker Meetings: More than 4 times per year    Marital Status: Divorced  Intimate Partner Violence: Not At Risk (07/31/2023)   Humiliation, Afraid, Rape, and Kick questionnaire    Fear of Current or Ex-Partner: No    Emotionally Abused: No    Physically Abused: No    Sexually Abused: No   Functional Status Survey:    Allergies  Allergen Reactions   Dicyclomine Itching, Nausea Only, Rash and Other (See Comments)    Stomach upset   Morphine Sulfate Nausea And  Vomiting   Mupirocin    Statins Other (See Comments)    Caused aching   Losartan Potassium Other (See Comments)    Caused stomach upset   Morphine And Codeine Other (See Comments)    Acid reflux, sick on stomach   Sulfa Antibiotics Nausea And Vomiting and Other (See Comments)    GI Intolerance   Butalbital-Asa-Caff-Codeine     Other Reaction(s): vomiting, vomiting, vomiting   Codeine Nausea Only    Other Reaction(s): GI Intolerance, Not available, vomiting   Elemental Sulfur Nausea Only   Losartan Nausea Only    Pertinent  Health Maintenance Due  Topic Date Due   DEXA SCAN  Never done   INFLUENZA VACCINE  01/22/2023    Medications: Outpatient  Encounter Medications as of 08/14/2023  Medication Sig   acetaminophen (TYLENOL) 500 MG tablet Take 2 tablets (1,000 mg total) by mouth 3 (three) times daily. Continue 1 g 3 times daily for 5 to 7 days and then consider changing to 1 g 3 times daily as needed for pain.   amLODipine (NORVASC) 5 MG tablet Take 0.5 tablets (2.5 mg total) by mouth at bedtime.   apixaban (ELIQUIS) 2.5 MG TABS tablet Take 1 tablet (2.5 mg total) by mouth 2 (two) times daily.   atorvastatin (LIPITOR) 10 MG tablet Take 10 mg by mouth every other day.   brinzolamide (AZOPT) 1 % ophthalmic suspension Place 1 drop into both eyes 2 (two) times daily.    cephALEXin (KEFLEX) 250 MG capsule Take 250 mg by mouth at bedtime.   Cholecalciferol (VITAMIN D3) 125 MCG (5000 UT) CAPS Take 1 capsule by mouth daily.   estradiol (ESTRACE) 0.1 MG/GM vaginal cream Place 1 Applicatorful vaginally 2 (two) times a week.   ezetimibe (ZETIA) 10 MG tablet Take 10 mg by mouth daily.   gabapentin (NEURONTIN) 100 MG capsule Take 100 mg by mouth daily.   lidocaine (LIDODERM) 5 % Place 2 patches onto the skin daily. Remove & Discard patch within 12 hours or as directed by MD.  Apply to left anterolateral chest wall at site of maximal patient reported pain.   methocarbamol (ROBAXIN) 500 MG tablet  Take 1 tablet (500 mg total) by mouth 3 (three) times daily. Take 500 Mg 3 times daily x 5 to 7 days then recommend changing to 500 Mg 3 times daily as needed for muscle spasms.   metoprolol succinate (TOPROL-XL) 25 MG 24 hr tablet Take 0.5 tablets (12.5 mg total) by mouth daily. SCHEDULE OFFICE VISIT FOR FUTURE REFILLS   nitroGLYCERIN (NITROSTAT) 0.4 MG SL tablet Place 1 tablet (0.4 mg total) under the tongue every 5 (five) minutes x 3 doses as needed for chest pain.   omeprazole (PRILOSEC) 20 MG capsule Take 20 mg by mouth daily.   polyethylene glycol (MIRALAX / GLYCOLAX) 17 g packet Take 17 g by mouth daily.   senna-docusate (SENOKOT-S) 8.6-50 MG tablet Take 1 tablet by mouth 2 (two) times daily.   No facility-administered encounter medications on file as of 08/14/2023.    Review of Systems  Constitutional:  Negative for chills and fever.  HENT:  Negative for sinus pressure and sore throat.   Respiratory:  Negative for cough, shortness of breath and wheezing.   Cardiovascular:  Negative for chest pain, palpitations and leg swelling.  Gastrointestinal:  Negative for abdominal distention, abdominal pain, blood in stool, constipation, diarrhea, nausea and vomiting.  Genitourinary:  Negative for dysuria, frequency and urgency.  Neurological:  Negative for dizziness, weakness and numbness.  Psychiatric/Behavioral:  Negative for confusion.     There were no vitals filed for this visit. There is no height or weight on file to calculate BMI. Physical Exam Constitutional:      Appearance: Normal appearance.  HENT:     Head: Normocephalic and atraumatic.  Cardiovascular:     Rate and Rhythm: Normal rate and regular rhythm.  Pulmonary:     Effort: Pulmonary effort is normal. No respiratory distress.     Breath sounds: Normal breath sounds. No wheezing.  Abdominal:     General: Bowel sounds are normal. There is no distension.     Tenderness: There is no abdominal tenderness. There is no  guarding or rebound.     Comments:  Musculoskeletal:        General: No swelling or tenderness.  Skin:    General: Skin is dry.  Neurological:     Mental Status: She is alert. Mental status is at baseline.     Sensory: No sensory deficit.     Motor: No weakness.     Labs reviewed: Basic Metabolic Panel: Recent Labs    07/31/23 1540 08/01/23 0410 08/03/23 0536 08/04/23 0530  NA 137 138 136 134*  K 3.6 3.5 3.1* 4.4  CL 101 106 97* 99  CO2 26 24 22 23   GLUCOSE 129* 113* 112* 103*  BUN 27* 23 10 16   CREATININE 1.05* 0.94 0.81 0.90  CALCIUM 9.2 9.1 9.1 8.5*  MG 2.1  --  1.5* 2.5*   Liver Function Tests: Recent Labs    08/03/23 0536  AST 27  ALT 17  ALKPHOS 42  BILITOT 0.9  PROT 5.8*  ALBUMIN 3.1*   No results for input(s): "LIPASE", "AMYLASE" in the last 8760 hours. No results for input(s): "AMMONIA" in the last 8760 hours. CBC: Recent Labs    07/31/23 1540 08/01/23 0410 08/03/23 0536  WBC 9.4 4.3 5.1  HGB 15.0 12.4 14.0  HCT 46.0 37.4 41.8  MCV 101.5* 99.7 97.7  PLT 188 160 180   Cardiac Enzymes: No results for input(s): "CKTOTAL", "CKMB", "CKMBINDEX", "TROPONINI" in the last 8760 hours. BNP: Invalid input(s): "POCBNP" CBG: No results for input(s): "GLUCAP" in the last 8760 hours.  Procedures and Imaging Studies During Stay: ECHOCARDIOGRAM COMPLETE Result Date: 08/01/2023    ECHOCARDIOGRAM REPORT   Patient Name:   Julie Petty Date of Exam: 08/01/2023 Medical Rec #:  [id number]         Height:       63.0 in Accession #:    [id number]        Weight:       131.2 lb Date of Birth:  21-Sep-1931         BSA:          1.616 m Patient Age:    89 years          BP:           149/71 mmHg Patient Gender: F                 HR:           56 bpm. Exam Location:  Inpatient Procedure: 2D Echo, Cardiac Doppler and Color Doppler Indications:    R55 Syncope  History:        Patient has prior history of Echocardiogram examinations, most                 recent 04/25/2018.  Cardiomyopathy, Previous Myocardial Infarction                 and CAD; Risk Factors:Hypertension and Dyslipidemia. FLU                 positive. Pneumothorax.  Sonographer:    Sheralyn Boatman RDCS Referring Phys: [phone] ANAND D HONGALGI  Sonographer Comments: Delay to get into bed. IMPRESSIONS  1. Left ventricular ejection fraction, by estimation, is 60 to 65%. The left ventricle has normal function. The left ventricle has no regional wall motion abnormalities. Left ventricular diastolic parameters are consistent with Grade I diastolic dysfunction (impaired relaxation).  2. Right ventricular systolic function is normal. The right ventricular size is normal. There is mildly elevated pulmonary artery systolic pressure. The estimated right  ventricular systolic pressure is 42.4 mmHg.  3. The mitral valve is normal in structure. Trivial mitral valve regurgitation. No evidence of mitral stenosis.  4. The aortic valve is tricuspid. There is mild calcification of the aortic valve. Aortic valve regurgitation is mild. No aortic stenosis is present. Aortic regurgitation PHT measures 689 msec.  5. The inferior vena cava is normal in size with greater than 50% respiratory variability, suggesting right atrial pressure of 3 mmHg. FINDINGS  Left Ventricle: Left ventricular ejection fraction, by estimation, is 60 to 65%. The left ventricle has normal function. The left ventricle has no regional wall motion abnormalities. The left ventricular internal cavity size was normal in size. There is  no left ventricular hypertrophy. Left ventricular diastolic parameters are consistent with Grade I diastolic dysfunction (impaired relaxation). Normal left ventricular filling pressure. Right Ventricle: The right ventricular size is normal. No increase in right ventricular wall thickness. Right ventricular systolic function is normal. There is mildly elevated pulmonary artery systolic pressure. The tricuspid regurgitant velocity is 3.14  m/s, and with  an assumed right atrial pressure of 3 mmHg, the estimated right ventricular systolic pressure is 42.4 mmHg. Left Atrium: Left atrial size was normal in size. Right Atrium: Right atrial size was normal in size. Pericardium: There is no evidence of pericardial effusion. Mitral Valve: The mitral valve is normal in structure. Trivial mitral valve regurgitation. No evidence of mitral valve stenosis. Tricuspid Valve: The tricuspid valve is normal in structure. Tricuspid valve regurgitation is mild . No evidence of tricuspid stenosis. Aortic Valve: The aortic valve is tricuspid. There is mild calcification of the aortic valve. Aortic valve regurgitation is mild. Aortic regurgitation PHT measures 689 msec. No aortic stenosis is present. Pulmonic Valve: The pulmonic valve was not well visualized. Pulmonic valve regurgitation is not visualized. No evidence of pulmonic stenosis. Aorta: The aortic root and ascending aorta are structurally normal, with no evidence of dilitation. Venous: The inferior vena cava is normal in size with greater than 50% respiratory variability, suggesting right atrial pressure of 3 mmHg. IAS/Shunts: No atrial level shunt detected by color flow Doppler.  LEFT VENTRICLE PLAX 2D LVIDd:         3.40 cm     Diastology LVIDs:         1.90 cm     LV e' medial:    7.94 cm/s LV PW:         1.30 cm     LV E/e' medial:  12.4 LV IVS:        0.90 cm     LV e' lateral:   9.03 cm/s LVOT diam:     2.40 cm     LV E/e' lateral: 10.9 LV SV:         141 LV SV Index:   87 LVOT Area:     4.52 cm  LV Volumes (MOD) LV vol d, MOD A2C: 51.8 ml LV vol d, MOD A4C: 55.8 ml LV vol s, MOD A2C: 18.7 ml LV vol s, MOD A4C: 16.4 ml LV SV MOD A2C:     33.1 ml LV SV MOD A4C:     55.8 ml LV SV MOD BP:      36.3 ml RIGHT VENTRICLE             IVC RV S prime:     12.10 cm/s  IVC diam: 2.00 cm TAPSE (M-mode): 2.0 cm LEFT ATRIUM  Index        RIGHT ATRIUM           Index LA diam:        3.70 cm 2.29 cm/m   RA Area:     14.00 cm  LA Vol (A2C):   42.6 ml 26.36 ml/m  RA Volume:   32.40 ml  20.05 ml/m LA Vol (A4C):   37.4 ml 23.14 ml/m LA Biplane Vol: 42.1 ml 26.05 ml/m  AORTIC VALVE LVOT Vmax:   126.00 cm/s LVOT Vmean:  81.400 cm/s LVOT VTI:    0.311 m AI PHT:      689 msec  AORTA Ao Root diam: 3.40 cm Ao Asc diam:  3.30 cm MITRAL VALVE                TRICUSPID VALVE MV Area (PHT): 3.42 cm     TR Peak grad:   39.4 mmHg MV Decel Time: 222 msec     TR Vmax:        314.00 cm/s MV E velocity: 98.50 cm/s MV A velocity: 103.00 cm/s  SHUNTS MV E/A ratio:  0.96         Systemic VTI:  0.31 m                             Systemic Diam: 2.40 cm Vishnu Priya Mallipeddi Electronically signed by Winfield Rast Mallipeddi Signature Date/Time: 08/01/2023/4:07:16 PM    Final    DG Chest 2 View Result Date: 08/01/2023 CLINICAL DATA:  Pneumothorax. EXAM: CHEST - 2 VIEW COMPARISON:  12/21/2019.  Chest CT 07/31/2023. FINDINGS: The cardio pericardial silhouette is enlarged. There is pulmonary vascular congestion without overt pulmonary edema. Interstitial markings are diffusely coarsened with chronic features. The trace anterior pneumothorax seen on CT scan yesterday is not evident by x-ray today. Multiple left-sided rib fractures evident, better characterized on yesterday's CT scan. Bones are diffusely demineralized. Telemetry leads overlie the chest. IMPRESSION: 1. The trace anterior pneumothorax seen on CT scan yesterday is not evident by x-ray today. 2. Multiple left-sided rib fractures, better characterized on yesterday's CT scan. Electronically Signed   By: Kennith Center M.D.   On: 08/01/2023 07:38   CT Chest Wo Contrast Addendum Date: 07/31/2023 ADDENDUM REPORT: 07/31/2023 17:45 ADDENDUM: The original report was by Dr. Gaylyn Rong. The following addendum is by Dr. Gaylyn Rong: Critical Value/emergent results were called by telephone at the time of interpretation on 07/31/2023 at 5:34 pm to provider MATTHEW [facility] , who verbally acknowledged  these results. Electronically Signed   By: Gaylyn Rong M.D.   On: 07/31/2023 17:45   Result Date: 07/31/2023 CLINICAL DATA:  Chest trauma, clinically suspected left rib fractures. Fall, syncope. EXAM: CT CHEST WITHOUT CONTRAST TECHNIQUE: Multidetector CT imaging of the chest was performed following the standard protocol without IV contrast. RADIATION DOSE REDUCTION: This exam was performed according to the departmental dose-optimization program which includes automated exposure control, adjustment of the mA and/or kV according to patient size and/or use of iterative reconstruction technique. COMPARISON:  Chest radiograph 12/21/2019 and chest CT 07/12/2018 FINDINGS: Cardiovascular: Coronary, aortic arch, and branch vessel atherosclerotic vascular disease. Mild cardiomegaly. Mediastinum/Nodes: Unremarkable Lungs/Pleura: Biapical pleuroparenchymal scarring. Airway thickening noted with cylindrical bronchiectasis in the right middle lobe and airway plugging in the right middle lobe and both lower lobes. Clustered reticulonodular opacities in the right middle lobe posteriorly are increased from prior but still favor atypical infectious bronchiolitis based on  morphology. Dependent atelectasis noted in both lower lobes. Airway plugging noted in the left upper lobe. There is a 2% or less left pneumothorax noted best seen anterior to the left pericardium. Trace left hemothorax. Upper Abdomen: Abdominal aortic atherosclerosis, including substantial atheromatous plaque at the origins of the celiac trunk and superior mesenteric artery. Musculoskeletal: Acute segmental fractures of the left third, fourth, sixth, seventh, eighth, and tenth ribs noted. Additional acute fractures of the left ninth and eleventh ribs noted. Thoracic kyphosis. IMPRESSION: 1. Acute segmental fractures of the left third, fourth, sixth, seventh, eighth, and tenth ribs. Additional acute fractures of the left ninth and eleventh ribs. 2. There is a  2% or less left pneumothorax noted best seen anterior to the left pericardium. Trace left hemothorax. 3. Airway thickening with cylindrical bronchiectasis in the right middle lobe and airway plugging in the right middle lobe and both lower lobes. Clustered reticulonodular opacities in the right middle lobe posteriorly are increased from prior but still favor atypical infectious bronchiolitis based on morphology. 4. Dependent atelectasis in both lower lobes. 5. Mild cardiomegaly. 6. Coronary, aortic arch, and branch vessel atherosclerotic vascular disease. 7.  Aortic Atherosclerosis (ICD10-I70.0). Radiology assistant personnel have been notified to put me in telephone contact with the referring physician or the referring physician's clinical representative in order to discuss these findings. Once this communication is established I will issue an addendum to this report for documentation purposes. Electronically Signed: By: Gaylyn Rong M.D. On: 07/31/2023 17:29   CT Head Wo Contrast Result Date: 07/31/2023 CLINICAL DATA:  Polytrauma, blunt EXAM: CT HEAD WITHOUT CONTRAST CT CERVICAL SPINE WITHOUT CONTRAST TECHNIQUE: Multidetector CT imaging of the head and cervical spine was performed following the standard protocol without intravenous contrast. Multiplanar CT image reconstructions of the cervical spine were also generated. RADIATION DOSE REDUCTION: This exam was performed according to the departmental dose-optimization program which includes automated exposure control, adjustment of the mA and/or kV according to patient size and/or use of iterative reconstruction technique. COMPARISON:  CT head 07/12/2018. FINDINGS: CT HEAD FINDINGS Brain: No evidence of acute infarction, hemorrhage, hydrocephalus, extra-axial collection or mass lesion/mass effect. Vascular: No hyperdense vessel. Skull: No acute fracture.  Small high left scalp contusion. Sinuses/Orbits: Clear sinuses.  No acute orbital findings. Other: No  mastoid effusions. CT CERVICAL SPINE FINDINGS Alignment: Approximately 2 mm of anterolisthesis of C4 on C5, likely degenerative given severe facet arthropathy on the left at this level. Mild broad levocurvature. Skull base and vertebrae: Vertebral body heights are maintained. No evidence of acute fracture. Osteopenia. Soft tissues and spinal canal: No prevertebral fluid or swelling. No visible canal hematoma. Disc levels: Multilevel facet uncovertebral hypertrophy. Multilevel degenerative disc disease, greatest at C6-C7. Upper chest: Visualized lung apices are clear. IMPRESSION: 1. No evidence of acute abnormality intracranially or in the cervical spine. 2. Small high left scalp contusion. Electronically Signed   By: Feliberto Harts M.D.   On: 07/31/2023 16:37   CT Cervical Spine Wo Contrast Result Date: 07/31/2023 CLINICAL DATA:  Polytrauma, blunt EXAM: CT HEAD WITHOUT CONTRAST CT CERVICAL SPINE WITHOUT CONTRAST TECHNIQUE: Multidetector CT imaging of the head and cervical spine was performed following the standard protocol without intravenous contrast. Multiplanar CT image reconstructions of the cervical spine were also generated. RADIATION DOSE REDUCTION: This exam was performed according to the departmental dose-optimization program which includes automated exposure control, adjustment of the mA and/or kV according to patient size and/or use of iterative reconstruction technique. COMPARISON:  CT head 07/12/2018. FINDINGS: CT  HEAD FINDINGS Brain: No evidence of acute infarction, hemorrhage, hydrocephalus, extra-axial collection or mass lesion/mass effect. Vascular: No hyperdense vessel. Skull: No acute fracture.  Small high left scalp contusion. Sinuses/Orbits: Clear sinuses.  No acute orbital findings. Other: No mastoid effusions. CT CERVICAL SPINE FINDINGS Alignment: Approximately 2 mm of anterolisthesis of C4 on C5, likely degenerative given severe facet arthropathy on the left at this level. Mild broad  levocurvature. Skull base and vertebrae: Vertebral body heights are maintained. No evidence of acute fracture. Osteopenia. Soft tissues and spinal canal: No prevertebral fluid or swelling. No visible canal hematoma. Disc levels: Multilevel facet uncovertebral hypertrophy. Multilevel degenerative disc disease, greatest at C6-C7. Upper chest: Visualized lung apices are clear. IMPRESSION: 1. No evidence of acute abnormality intracranially or in the cervical spine. 2. Small high left scalp contusion. Electronically Signed   By: Feliberto Harts M.D.   On: 07/31/2023 16:37   CT T-SPINE NO CHARGE Result Date: 07/31/2023 CLINICAL DATA:  Fall, syncopal episode EXAM: CT THORACIC SPINE WITHOUT CONTRAST TECHNIQUE: Multidetector CT images of the thoracic were obtained using the standard protocol without intravenous contrast. RADIATION DOSE REDUCTION: This exam was performed according to the departmental dose-optimization program which includes automated exposure control, adjustment of the mA and/or kV according to patient size and/or use of iterative reconstruction technique. COMPARISON:  07/27/2018 MRI thoracic spine, 07/12/2018 CT chest no prior dedicated CT of the thoracic spine FINDINGS: Alignment: Exaggeration of the normal thoracic kyphosis. Trace anterolisthesis of C7 on T1. No other significant listhesis. Vertebrae: No acute fracture or suspicious osseous lesion. Chronic mild wedging of T7, T8, and T9. 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 For additional findings in the thorax, including rib findings, please see same-day CT chest. Disc levels: No significant spinal canal stenosis or neural foraminal narrowing. IMPRESSION: No acute fracture or traumatic listhesis in the thoracic spine. Electronically Signed   By: Wiliam Ke M.D.   On: 07/31/2023 16:37     Assessment and Plan         1. Essential hypertension (Primary) 2. Cardiomyopathy, unspecified type (HCC) 3. Syncope, unspecified syncope type 4. Closed fracture of multiple ribs of left side, sequela 5. Hyperlipidemia, unspecified hyperlipidemia type    Other orders - amLODipine (NORVASC) 5 MG tablet; Take 0.5 tablets (2.5 mg total) by mouth at bedtime.  Dispense: 30 tablet; Refill: 0 - apixaban (ELIQUIS) 2.5 MG TABS tablet; Take 1 tablet (2.5 mg total) by mouth 2 (two) times daily.  Dispense: 60 tablet; Refill: 0 - atorvastatin (LIPITOR) 10 MG tablet; Take 1 tablet (10 mg total) by mouth every other day.  Dispense: 30 tablet; Refill: 0 - cephALEXin (KEFLEX) 250 MG capsule; Take 1 capsule (250 mg total) by mouth at bedtime.  Dispense: 30 capsule; Refill: 0 - brinzolamide (AZOPT) 1 % ophthalmic suspension; Place 1 drop into both eyes 2 (two) times daily.  Dispense: 10 mL; Refill: 0 - estradiol (ESTRACE) 0.1 MG/GM vaginal cream; Place 1 Applicatorful vaginally 2 (two) times a week.  Dispense: 42.5 g; Refill: 0 - ezetimibe (ZETIA) 10 MG tablet; Take 1 tablet (10 mg total) by mouth daily.  Dispense: 30 tablet; Refill: 0 - gabapentin (NEURONTIN) 100 MG capsule; Take 1 capsule (100 mg total) by mouth daily.  Dispense: 30 capsule; Refill: 0 - metoprolol succinate (TOPROL-XL) 25 MG 24 hr tablet; Take 0.5 tablets (12.5  mg total) by mouth daily. SCHEDULE OFFICE VISIT FOR FUTURE REFILLS  Dispense: 15 tablet; Refill: 0 - omeprazole (PRILOSEC) 20 MG capsule; Take 1 capsule (20 mg total) by mouth daily.  Dispense: 30 capsule; Refill: 0        Patient has been advised to f/u with their PCP in 1-2 weeks to for a transitions of care visit.       35 min  Total time spent for obtaining history,  performing a medically appropriate examination and evaluation, reviewing the tests  documenting clinical information in the electronic or other health record,  ,care coordination  for discharge  planning.(not separately reported)

## 2023-08-18 ENCOUNTER — Other Ambulatory Visit: Payer: Self-pay | Admitting: Sports Medicine

## 2023-08-19 ENCOUNTER — Telehealth: Payer: Self-pay | Admitting: Cardiovascular Disease

## 2023-08-19 MED ORDER — APIXABAN 2.5 MG PO TABS
2.5000 mg | ORAL_TABLET | Freq: Two times a day (BID) | ORAL | 0 refills | Status: DC
Start: 2023-08-19 — End: 2023-09-01

## 2023-08-19 NOTE — Telephone Encounter (Signed)
*  STAT* If patient is at the pharmacy, call can be transferred to refill team.   1. Which medications need to be refilled? (please list name of each medication and dose if known)    apixaban (ELIQUIS) 2.5 MG TABS tablet   2. Would you like to learn more about the convenience, safety, & potential cost savings by using the [facility]?   3. Are you open to using the [facility] (Type [facility]. ).  4. Which pharmacy/location (including street and city if local pharmacy) is medication to be sent to?  [facility] [address] [place], [place] - [address]   5. Do they need a 30 day or 90 day supply?   30 day  Daughter in law Ship broker) stated patient is completely out of this medication.  Patient has appointment scheduled on 3/11.

## 2023-08-19 NOTE — Telephone Encounter (Signed)
 Prescription refill request for Eliquis received. Indication: A Flutter Last office visit: ED on 08/04/23  ( appt 09/01/23) Scr: 0.90 on 08/04/23  Epic Age: 89 Weight: 56.3kg  Based on above findings Eliquis 2.5mg  twice daily is the appropriate dose.  Eliquis was started in ED.  Has upcoming appt with E Monge NP on 09/01/23.  Refill approved x 1.

## 2023-08-21 ENCOUNTER — Other Ambulatory Visit: Payer: Self-pay | Admitting: Sports Medicine

## 2023-08-28 ENCOUNTER — Other Ambulatory Visit: Payer: Self-pay | Admitting: Sports Medicine

## 2023-08-30 ENCOUNTER — Other Ambulatory Visit: Payer: Self-pay | Admitting: Cardiovascular Disease

## 2023-08-31 ENCOUNTER — Telehealth: Payer: Self-pay | Admitting: *Deleted

## 2023-08-31 NOTE — Telephone Encounter (Signed)
 Copied from CRM [id number]. Topic: Clinical - Medication Question >> Aug 31, 2023 12:21 PM Everette Rank wrote: Reason for CRM: [name] from [employer] requests update on Prior Auth for medication:brinzolamide (AZOPT) 1 % ophthalmic suspension. Sent on 03/09. Office needs to answer question on Fax then send back.   Needs response back before 03/26   Ref :[id number] CB:[phone] Fax [phone]   Awaiting Fax.

## 2023-08-31 NOTE — Addendum Note (Signed)
 Encounter addended by: Flavia Shipper on: 08/31/2023 12:50 PM  Actions taken: Imaging Exam ended

## 2023-09-01 ENCOUNTER — Encounter: Payer: Self-pay | Admitting: Nurse Practitioner

## 2023-09-01 ENCOUNTER — Ambulatory Visit: Payer: Medicare Other | Attending: Nurse Practitioner | Admitting: Cardiology

## 2023-09-01 VITALS — BP 112/78 | HR 62 | Ht 61.0 in | Wt 127.2 lb

## 2023-09-01 DIAGNOSIS — I4892 Unspecified atrial flutter: Secondary | ICD-10-CM | POA: Diagnosis not present

## 2023-09-01 DIAGNOSIS — E785 Hyperlipidemia, unspecified: Secondary | ICD-10-CM

## 2023-09-01 DIAGNOSIS — I2510 Atherosclerotic heart disease of native coronary artery without angina pectoris: Secondary | ICD-10-CM

## 2023-09-01 DIAGNOSIS — I5181 Takotsubo syndrome: Secondary | ICD-10-CM | POA: Diagnosis not present

## 2023-09-01 DIAGNOSIS — I10 Essential (primary) hypertension: Secondary | ICD-10-CM

## 2023-09-01 MED ORDER — APIXABAN 2.5 MG PO TABS: 2.5000 mg | ORAL_TABLET | Freq: Two times a day (BID) | ORAL | 3 refills | Status: DC

## 2023-09-01 MED ORDER — ATORVASTATIN CALCIUM 40 MG PO TABS: 40.0000 mg | ORAL_TABLET | Freq: Every day | ORAL | 0 refills | Status: DC

## 2023-09-01 MED ORDER — ATORVASTATIN CALCIUM 40 MG PO TABS: 40.0000 mg | ORAL_TABLET | Freq: Every day | ORAL | 3 refills | Status: DC

## 2023-09-01 NOTE — Patient Instructions (Addendum)
 Medication Instructions:  Start Lipitor 40 mg daily Continue Zetia 10 mg daily Continue to hold Amlodipine as directed.  *If you need a refill on your cardiac medications before your next appointment, please call your pharmacy*   Lab Work: Your physician recommends that you return for lab work in 8 weeks. Fasting lipid panel    Testing/Procedures: NONE ordered at this time of appointment   Follow-Up: At [facility], you and your health needs are our priority.  As part of our continuing mission to provide you with exceptional heart care, we have created designated Provider Care Teams.  These Care Teams include your primary Cardiologist (physician) and Advanced Practice Providers (APPs -  Physician Assistants and Nurse Practitioners) who all work together to provide you with the care you need, when you need it.  We recommend signing up for the patient portal called "MyChart".  Sign up information is provided on this After Visit Summary.  MyChart is used to connect with patients for Virtual Visits (Telemedicine).  Patients are able to view lab/test results, encounter notes, upcoming appointments, etc.  Non-urgent messages can be sent to your provider as well.   To learn more about what you can do with MyChart, go to [email or web address].    Your next appointment:   6 month(s)  Provider:   Thurmon Fair, MD     Other Instructions   1st Floor: - Lobby - Registration  - Pharmacy  - Lab - Cafe  2nd Floor: - PV Lab - Diagnostic Testing (echo, CT, nuclear med)  3rd Floor: - Vacant  4th Floor: - TCTS (cardiothoracic surgery) - AFib Clinic - Structural Heart Clinic - Vascular Surgery  - Vascular Ultrasound  5th Floor: - HeartCare Cardiology (general and EP) - Clinical Pharmacy for coumadin, hypertension, lipid, weight-loss medications, and med management appointments    Valet parking services will be available as well.

## 2023-09-01 NOTE — Progress Notes (Signed)
 Cardiology Office Note:  .   Date:  09/01/2023  ID:  Julie Petty, DOB 1932/02/20, MRN [id number] PCP: Dani Gobble, PA-C   HeartCare Providers Cardiologist:  Thurmon Fair, MD {  History of Present Illness: .   Julie Petty is a 89 y.o. female paroxysmal atrial flutter, Takotsubo cardiomyopathy with recovered EF, mild to moderate CAD on heart cath 2019, recurrent UTIs, hypertension, hyperlipidemia.  Patient has history of left heart catheterization in 2019 and was found to have 40% stenosis of the second diagonal artery.  Echocardiogram felt to be Takotsubo, with normalization on subsequent echocardiogram..  Patient was recently admitted to the hospital in February 2025 where she had an episode of orthostatic syncope that was attributed to cough medicine/dizziness/dehydration.  She sustained 8 left-sided rib fractures and small left pneumothorax that had resolved.  On telemetry she had paroxysmal episodes of atrial flutter, asymptomatic.  She was discharged DOAC, and 2-week live monitor to assess burden that showed predominantly sinus rhythm, atrial flutter/fibrillation was not noted.  Looks like she followed up with [facility] cardiology 3/7.  They discussed anticoagulation.  She was not interested in watchman's device.  They had stopped her amlodipine due to hypotension.  They stopped Zetia for unclear reasons with an LDL 170, considering atorvastatin uptitration.  Noted to be in sinus bradycardia at that visit.  She returns today with her sister for follow-up to discuss ongoing anticoagulation.  She reports she followed up with [facility] because a friend had recommended them however she would like to establish care with: Heart care.  Reports no other falls, episodes of syncope, dizziness, lightheadedness, chest pain.  No shortness of breath or significant swelling.  She has had uncontrolled cholesterol and only been managed on atorvastatin 10 mg and Zetia which have been added  and subtracted at various times.  No evidence of myalgias, reported prior leg weakness she said was not due to her statin but rather her spinal stenosis.   ROS: Denies: Chest pain, shortness of breath, orthopnea, peripheral edema, palpitations, decreased exercise intolerance,    Studies Reviewed: .    Cardiac Studies & Procedures   ______________________________________________________________________________________________ CARDIAC CATHETERIZATION  CARDIAC CATHETERIZATION 04/26/2018  Narrative Images from the original result were not included.   LV end diastolic pressure is normal.  Ost 2nd Diag lesion is 40% stenosed. Otherwise minimal coronary disease.  SUMMARY  Angiographically minimal coronary artery disease -no culprit lesion found.  Normal LV filling pressures.  RECOMMENDATIONS  Return to nursing unit for ongoing care.  TR band removal per protocol.  Medical management for mild CAD.  Consider the possibility of coronary spasm versus myocarditis/ pericarditis for elevated troponin.  Recommend Aspirin 81mg  daily for moderate CAD.  Discharge planning per family service.3    Bryan Lemma, MD Bryan Lemma, M.D., M.S. Interventional Cardiologist  Pager # [phone] Phone # [phone] [address] [place], [place] [address]  Findings Coronary Findings Diagnostic  Dominance: Right  Left Main Vessel was injected. Vessel is normal in caliber and large. Vessel is angiographically normal.  Left Anterior Descending Vessel was injected. Vessel is normal in caliber. Vessel is angiographically normal.  First Diagonal Branch Vessel was injected. Vessel is small in size. Vessel is angiographically normal.  First Septal Branch Vessel was injected. Vessel is small in size. Vessel is angiographically normal.  Second Diagonal Branch Vessel was injected. Vessel is small in size. Vessel is angiographically normal. Ost 2nd Diag lesion is 40%  stenosed.  Second Septal Branch Vessel  was injected. Vessel is small in size. Vessel is angiographically normal.  Third Diagonal Branch Vessel was injected. Vessel is small in size. Vessel is angiographically normal.  Third Septal Branch Vessel was injected. Vessel is small in size. Vessel is angiographically normal.  Ramus Intermedius Vessel was injected. Vessel is small. Vessel is angiographically normal.  Left Circumflex Vessel was injected. Vessel is normal in caliber and large. Vessel is angiographically normal.  First Obtuse Marginal Branch Vessel was injected. Vessel is moderate in size. Vessel is angiographically normal.  First Left Posterolateral Branch Vessel was injected. Vessel is small in size. Vessel is angiographically normal.  Left Posterior Atrioventricular Artery Vessel was injected. Vessel is normal in size. moderate Vessel is angiographically normal.  Right Coronary Artery Vessel was injected. Vessel is normal in caliber and large. The vessel exhibits minimal luminal irregularities.  Acute Marginal Branch Vessel was injected. Vessel is small in size. Vessel is angiographically normal.  Right Posterior Descending Artery Vessel was injected. Vessel is small in size. Vessel is angiographically normal.  Inferior Septal Vessel was injected. Vessel is small in size. Vessel is angiographically normal.  Right Posterior Atrioventricular Artery Vessel was injected. Vessel is small in size. Vessel is angiographically normal.  Intervention  No interventions have been documented.     ECHOCARDIOGRAM  ECHOCARDIOGRAM COMPLETE 08/01/2023  Narrative ECHOCARDIOGRAM REPORT    Patient Name:   Julie Petty Date of Exam: 08/01/2023 Medical Rec #:  [id number]         Height:       63.0 in Accession #:    [id number]        Weight:       131.2 lb Date of Birth:  13-Jun-1932         BSA:          1.616 m Patient Age:    89 years          BP:           149/71  mmHg Patient Gender: F                 HR:           56 bpm. Exam Location:  Inpatient  Procedure: 2D Echo, Cardiac Doppler and Color Doppler  Indications:    R55 Syncope  History:        Patient has prior history of Echocardiogram examinations, most recent 04/25/2018. Cardiomyopathy, Previous Myocardial Infarction and CAD; Risk Factors:Hypertension and Dyslipidemia. FLU positive. Pneumothorax.  Sonographer:    Sheralyn Boatman RDCS Referring Phys: [phone] ANAND D HONGALGI   Sonographer Comments: Delay to get into bed. IMPRESSIONS   1. Left ventricular ejection fraction, by estimation, is 60 to 65%. The left ventricle has normal function. The left ventricle has no regional wall motion abnormalities. Left ventricular diastolic parameters are consistent with Grade I diastolic dysfunction (impaired relaxation). 2. Right ventricular systolic function is normal. The right ventricular size is normal. There is mildly elevated pulmonary artery systolic pressure. The estimated right ventricular systolic pressure is 42.4 mmHg. 3. The mitral valve is normal in structure. Trivial mitral valve regurgitation. No evidence of mitral stenosis. 4. The aortic valve is tricuspid. There is mild calcification of the aortic valve. Aortic valve regurgitation is mild. No aortic stenosis is present. Aortic regurgitation PHT measures 689 msec. 5. The inferior vena cava is normal in size with greater than 50% respiratory variability, suggesting right atrial pressure of 3 mmHg.  FINDINGS Left Ventricle: Left ventricular ejection fraction,  by estimation, is 60 to 65%. The left ventricle has normal function. The left ventricle has no regional wall motion abnormalities. The left ventricular internal cavity size was normal in size. There is no left ventricular hypertrophy. Left ventricular diastolic parameters are consistent with Grade I diastolic dysfunction (impaired relaxation). Normal left ventricular filling  pressure.  Right Ventricle: The right ventricular size is normal. No increase in right ventricular wall thickness. Right ventricular systolic function is normal. There is mildly elevated pulmonary artery systolic pressure. The tricuspid regurgitant velocity is 3.14 m/s, and with an assumed right atrial pressure of 3 mmHg, the estimated right ventricular systolic pressure is 42.4 mmHg.  Left Atrium: Left atrial size was normal in size.  Right Atrium: Right atrial size was normal in size.  Pericardium: There is no evidence of pericardial effusion.  Mitral Valve: The mitral valve is normal in structure. Trivial mitral valve regurgitation. No evidence of mitral valve stenosis.  Tricuspid Valve: The tricuspid valve is normal in structure. Tricuspid valve regurgitation is mild . No evidence of tricuspid stenosis.  Aortic Valve: The aortic valve is tricuspid. There is mild calcification of the aortic valve. Aortic valve regurgitation is mild. Aortic regurgitation PHT measures 689 msec. No aortic stenosis is present.  Pulmonic Valve: The pulmonic valve was not well visualized. Pulmonic valve regurgitation is not visualized. No evidence of pulmonic stenosis.  Aorta: The aortic root and ascending aorta are structurally normal, with no evidence of dilitation.  Venous: The inferior vena cava is normal in size with greater than 50% respiratory variability, suggesting right atrial pressure of 3 mmHg.  IAS/Shunts: No atrial level shunt detected by color flow Doppler.   LEFT VENTRICLE PLAX 2D LVIDd:         3.40 cm     Diastology LVIDs:         1.90 cm     LV e' medial:    7.94 cm/s LV PW:         1.30 cm     LV E/e' medial:  12.4 LV IVS:        0.90 cm     LV e' lateral:   9.03 cm/s LVOT diam:     2.40 cm     LV E/e' lateral: 10.9 LV SV:         141 LV SV Index:   87 LVOT Area:     4.52 cm  LV Volumes (MOD) LV vol d, MOD A2C: 51.8 ml LV vol d, MOD A4C: 55.8 ml LV vol s, MOD A2C: 18.7 ml LV  vol s, MOD A4C: 16.4 ml LV SV MOD A2C:     33.1 ml LV SV MOD A4C:     55.8 ml LV SV MOD BP:      36.3 ml  RIGHT VENTRICLE             IVC RV S prime:     12.10 cm/s  IVC diam: 2.00 cm TAPSE (M-mode): 2.0 cm  LEFT ATRIUM             Index        RIGHT ATRIUM           Index LA diam:        3.70 cm 2.29 cm/m   RA Area:     14.00 cm LA Vol (A2C):   42.6 ml 26.36 ml/m  RA Volume:   32.40 ml  20.05 ml/m LA Vol (A4C):   37.4 ml 23.14 ml/m LA  Biplane Vol: 42.1 ml 26.05 ml/m AORTIC VALVE LVOT Vmax:   126.00 cm/s LVOT Vmean:  81.400 cm/s LVOT VTI:    0.311 m AI PHT:      689 msec  AORTA Ao Root diam: 3.40 cm Ao Asc diam:  3.30 cm  MITRAL VALVE                TRICUSPID VALVE MV Area (PHT): 3.42 cm     TR Peak grad:   39.4 mmHg MV Decel Time: 222 msec     TR Vmax:        314.00 cm/s MV E velocity: 98.50 cm/s MV A velocity: 103.00 cm/s  SHUNTS MV E/A ratio:  0.96         Systemic VTI:  0.31 m Systemic Diam: 2.40 cm  Vishnu Priya Mallipeddi Electronically signed by Winfield Rast Mallipeddi Signature Date/Time: 08/01/2023/4:07:16 PM    Final          ______________________________________________________________________________________________     EKG Interpretation Date/Time:  Tuesday September 01 2023 11:18:54 EDT Ventricular Rate:  59 PR Interval:  184 QRS Duration:  110 QT Interval:  422 QTC Calculation: 417 R Axis:   -35  Text Interpretation: Sinus bradycardia with sinus arrhythmia Left axis deviation Incomplete right bundle branch block  no significant changes.  mild ST elevation V1 likely due to BBB Confirmed by Yvonna Alanis [phone]) on 09/01/2023 11:23:58 AM    Risk Assessment/Calculations:    CHA2DS2-VASc Score = 5   This indicates a 7.2% annual risk of stroke. The patient's score is based upon: CHF History: 0 HTN History: 1 Diabetes History: 0 Stroke History: 0 Vascular Disease History: 1 Age Score: 2 Gender Score: 1       Physical Exam:   VS:  BP  112/78 (BP Location: Left Arm, Patient Position: Sitting, Cuff Size: Normal)   Pulse 62   Ht 5\' 1"  (1.549 m)   Wt 127 lb 3.2 oz (57.7 kg)   SpO2 97%   BMI 24.03 kg/m    Wt Readings from Last 3 Encounters:  09/01/23 127 lb 3.2 oz (57.7 kg)  08/14/23 124 lb 3.2 oz (56.3 kg)  08/05/23 131 lb 2.8 oz (59.5 kg)    GEN: Well nourished, well developed in no acute distress NECK: No JVD; No carotid bruits CARDIAC: RRR, no murmurs, rubs, gallops RESPIRATORY:  Clear to auscultation without rales, wheezing or rhonchi  ABDOMEN: Soft, non-tender, non-distended EXTREMITIES:  No edema; No deformity   ASSESSMENT AND PLAN: .    Paroxysmal atrial flutter Asymptomatic, initial episode in the setting of influenza A.  2-week live monitor did not show recurrence of flutter/fibrillation.  She has been in sinus both at [facility] and here.  She has a CHA2DS2-VASc of 5.  We discussed at great lengths and eventually concluded that her stroke risk is high and in the absence of clear contraindications it is worth continuing with DOAC.  She would like to also ask a friend about further input.  Stroke risk discussed extensively. Will review with Dr. Royann Shivers On Toprol-XL 12.5 mg, Eliquis 2.5 mg twice daily for age and weight TSH normal   Hypertension Noted to be hypotensive with [facility].  Today BP well-controlled 110/78. Hold amlodipine 2.5 mg daily. Continue Toprol-XL 12.5 mg.  She will keep a blood pressure cuff log  History of Takotsubo cardiomyopathy Normalization of LVEF has been reiterated on multiple echocardiograms.  Last echocardiogram February 2025 60-65% with normal RV.  Nonobstructive CAD HLD Cardiac catheterization in  2019 showing 40% stenosis of the second diagonal artery.  Stable disease, no anginal complaints.  LDL 171. Increase atorvastatin 10 mg to 40 mg.  Has not had myalgias. Continue Zetia 10 mg Repeat lipid panel in 8 weeks, titrate accordingly and as tolerated       Dispo: 6 month.    Signed, Abagail Kitchens, PA-C

## 2023-09-03 ENCOUNTER — Other Ambulatory Visit: Payer: Self-pay | Admitting: Sports Medicine

## 2023-09-08 ENCOUNTER — Other Ambulatory Visit: Payer: Self-pay | Admitting: Sports Medicine

## 2023-09-09 ENCOUNTER — Other Ambulatory Visit: Payer: Self-pay | Admitting: Sports Medicine

## 2023-09-16 ENCOUNTER — Other Ambulatory Visit: Payer: Self-pay

## 2023-09-16 MED ORDER — EZETIMIBE 10 MG PO TABS: 10.0000 mg | ORAL_TABLET | Freq: Every day | ORAL | 3 refills | Status: DC

## 2023-09-16 MED ORDER — METOPROLOL SUCCINATE ER 25 MG PO TB24: 12.5000 mg | ORAL_TABLET | Freq: Every day | ORAL | 3 refills | Status: DC

## 2023-09-20 ENCOUNTER — Other Ambulatory Visit: Payer: Self-pay | Admitting: Sports Medicine

## 2023-09-24 ENCOUNTER — Other Ambulatory Visit: Payer: Self-pay | Admitting: Sports Medicine

## 2023-10-09 DIAGNOSIS — I4892 Unspecified atrial flutter: Secondary | ICD-10-CM

## 2023-11-20 ENCOUNTER — Telehealth: Payer: Self-pay | Admitting: Cardiovascular Disease

## 2023-11-20 NOTE — Telephone Encounter (Signed)
 Called patient back about message. Patient stated that she does not think she needs to be on Eliquis . She stated she only had A. Flutter a few times, and she was told her heart and her monitor was good. Patient wants to know why does she need to take Eliquis  if this is the case. Patient wants to see Dr. Alvis Ba to discuss this. Made patient an appointment in July. Will forward to Dr. Alvis Ba for advisement.

## 2023-11-20 NOTE — Telephone Encounter (Signed)
 Pt c/o medication issue:  1. Name of Medication:   apixaban  (ELIQUIS ) 2.5 MG TABS tablet    2. How are you currently taking this medication (dosage and times per day)? As written   3. Are you having a reaction (difficulty breathing--STAT)? No   4. What is your medication issue? Pt called in stating she has some questions/concerns about why she is on eliquis , please advise.

## 2023-11-26 ENCOUNTER — Encounter: Admitting: Nurse Practitioner

## 2024-01-07 NOTE — Progress Notes (Signed)
 Cardiology Office Note:    Date:  01/13/2024   ID:  Delara, Shepheard 06-Jul-1931, MRN [id number]  PCP:  Jacques Camie Pepper, PA-C  Cardiologist:  Jerel Balding, MD  Electrophysiologist:  None   Referring MD: Jacques Camie Pepper, SHAUNNA*   No chief complaint on file.  History of Present Illness:    Julie Petty is a 89 y.o. female with history of Takotsubo syndrome (less likely small NSTEMI due to coronary spasm) in November 2019, when cardiac catheterization showed only a 40% stenosis in the second diagonal artery, hyperlipidemia and mild hypertension.   She was hospitalized in February 2025 with syncope since possibly related to dehydration, complicated by left-sided rib fractures and a small left pneumothorax.  During that hospitalization she had brief episodes of asymptomatic paroxysmal atrial flutter.  No additional tachyarrhythmia was seen on a subsequent 2-week monitor.  She remains on anticoagulation at this time, but strongly wants to discontinue it.   Past Medical History:  Diagnosis Date   Complication of anesthesia    Essential hypertension    GERD (gastroesophageal reflux disease)    Glaucoma    Hernia    Hyperlipemia    Myocardial infarction (HCC)    NSTEMI 04/24/18 (40% D2 by LHC; Dx: Takotsubo CM vs coronary spasm vs myocarditis)   PONV (postoperative nausea and vomiting)     Past Surgical History:  Procedure Laterality Date   CARDIAC CATHETERIZATION  04/05/03   CHOLECYSTECTOMY N/A 08/17/2018   Procedure: Laparoscopic Cholecystectomy;  Surgeon: Vanderbilt Ned, MD;  Location: [facility] OR;  Service: General;  Laterality: N/A;   KNEE ARTHROSCOPY  09/17/05   left   LEFT HEART CATH AND CORONARY ANGIOGRAPHY N/A 04/26/2018   Procedure: LEFT HEART CATH AND CORONARY ANGIOGRAPHY;  Surgeon: Anner Alm LELON, MD;  Location: [facility] INVASIVE CV LAB;  Service: Cardiovascular;  Laterality: N/A;   VAGINAL HYSTERECTOMY  1980's    Current Medications: Current Meds  Medication  Sig   acetaminophen  (TYLENOL ) 500 MG tablet Take 2 tablets (1,000 mg total) by mouth 3 (three) times daily. Continue 1 g 3 times daily for 5 to 7 days and then consider changing to 1 g 3 times daily as needed for pain.   atorvastatin  (LIPITOR) 10 MG tablet Take 10 mg by mouth daily.   brinzolamide  (AZOPT ) 1 % ophthalmic suspension Place 1 drop into both eyes 2 (two) times daily.   cephALEXin  (KEFLEX ) 250 MG capsule Take 1 capsule (250 mg total) by mouth at bedtime.   Cholecalciferol  (VITAMIN D3) 125 MCG (5000 UT) CAPS Take 1 capsule by mouth daily.   ezetimibe  (ZETIA ) 10 MG tablet Take 1 tablet (10 mg total) by mouth daily.   gabapentin  (NEURONTIN ) 100 MG capsule Take 1 capsule (100 mg total) by mouth daily.   metoprolol  succinate (TOPROL -XL) 25 MG 24 hr tablet Take 0.5 tablets (12.5 mg total) by mouth daily.   omeprazole  (PRILOSEC) 20 MG capsule Take 1 capsule (20 mg total) by mouth daily.   [DISCONTINUED] apixaban  (ELIQUIS ) 2.5 MG TABS tablet Take 1 tablet (2.5 mg total) by mouth 2 (two) times daily.     Allergies:   Dicyclomine, Morphine  sulfate, Mupirocin, Statins, Losartan  potassium, Morphine  and codeine, Sulfa antibiotics, Butalbital-asa-caff-codeine, Codeine, Elemental sulfur, and Losartan    Family History: The patient's family history includes Heart attack in her brother.      CHA2DS2-VASc Score = 5   This indicates a 7.2% annual risk of stroke. The patient's score is based upon: CHF History: 0 HTN  History: 1 Diabetes History: 0 Stroke History: 0 Vascular Disease History: 1 Age Score: 2 Gender Score: 1      EKGs/Labs/Other Studies Reviewed:    The following studies were reviewed today: Echo and cardiac catheterization November 2019  Echocardiogram 08/01/2023   1. Left ventricular ejection fraction, by estimation, is 60 to 65%. The  left ventricle has normal function. The left ventricle has no regional  wall motion abnormalities. Left ventricular diastolic parameters  are  consistent with Grade I diastolic  dysfunction (impaired relaxation).   2. Right ventricular systolic function is normal. The right ventricular  size is normal. There is mildly elevated pulmonary artery systolic  pressure. The estimated right ventricular systolic pressure is 42.4 mmHg.   3. The mitral valve is normal in structure. Trivial mitral valve  regurgitation. No evidence of mitral stenosis.   4. The aortic valve is tricuspid. There is mild calcification of the  aortic valve. Aortic valve regurgitation is mild. No aortic stenosis is  present. Aortic regurgitation PHT measures 689 msec.   5. The inferior vena cava is normal in size with greater than 50%  respiratory variability, suggesting right atrial pressure of 3 mmHg.    EKG: Personally reviewed ECG tracing from 09/01/2023 which shows normal sinus rhythm incomplete right bundle branch block and left axis deviation  EKG Interpretation Date/Time:    Ventricular Rate:    PR Interval:    QRS Duration:    QT Interval:    QTC Calculation:   R Axis:      Text Interpretation:           Recent Labs: 08/01/2023: TSH 0.712 08/03/2023: ALT 17; Hemoglobin 14.0; Platelets 180 08/04/2023: BUN 16; Creatinine, Ser 0.90; Magnesium  2.5; Potassium 4.4; Sodium 134  Recent Lipid Panel    Component Value Date/Time   CHOL 147 07/30/2018 0938   TRIG 85 07/30/2018 0938   HDL 51 07/30/2018 0938   CHOLHDL 2.9 07/30/2018 0938   CHOLHDL 3.4 04/25/2018 0009   VLDL 6 04/25/2018 0009   LDLCALC 79 07/30/2018 0938   11/11/2023 Cholesterol 163, HDL 46, triglycerides 129, LDL 94  01/05/2024 Hemoglobin 13.5 with elevated MCV, creatinine 1.01, potassium 4.7, normal liver function tests   Physical Exam:    VS:  BP (!) 140/72 (BP Location: Left Arm, Patient Position: Sitting, Cuff Size: Normal)   Pulse 63   Ht 5' 1 (1.549 m)   Wt 130 lb 12.8 oz (59.3 kg)   SpO2 96%   BMI 24.71 kg/m     Wt Readings from Last 3 Encounters:  01/08/24  130 lb 12.8 oz (59.3 kg)  09/01/23 127 lb 3.2 oz (57.7 kg)  08/14/23 124 lb 3.2 oz (56.3 kg)      General: Alert, oriented x3, no distress, appears well Head: no evidence of trauma, PERRL, EOMI, no exophtalmos or lid lag, no myxedema, no xanthelasma; normal ears, nose and oropharynx Neck: normal jugular venous pulsations and no hepatojugular reflux; brisk carotid pulses without delay and no carotid bruits Chest: clear to auscultation, no signs of consolidation by percussion or palpation, normal fremitus, symmetrical and full respiratory excursions Cardiovascular: normal position and quality of the apical impulse, regular rhythm, normal first and second heart sounds, no murmurs, rubs or gallops Abdomen: no tenderness or distention, no masses by palpation, no abnormal pulsatility or arterial bruits, normal bowel sounds, no hepatosplenomegaly Extremities: no clubbing, cyanosis or edema; 2+ radial, ulnar and brachial pulses bilaterally; 2+ right femoral, posterior tibial and dorsalis pedis pulses;  2+ left femoral, posterior tibial and dorsalis pedis pulses; no subclavian or femoral bruits Neurological: grossly nonfocal Psych: Normal mood and affect   ASSESSMENT:    1. History of atrial flutter   2. Acquired thrombophilia (HCC)   3. Takotsubo syndrome   4. Hypercholesterolemia   5. Coronary artery disease involving native coronary artery of native heart without angina pectoris     PLAN:    In order of problems listed above:  History of atrial flutter: Occurred during acute illness with chest injury and pneumothorax.  No recurrence noted on a 2-week monitor and no clinical recurrence in the last 5 months since (but note that she was asymptomatic with the arrhythmia during the hospitalization). We discussed the fact that it is possible that she will have a symptomatic recurrence of this arrhythmia in the future.  At the same time, empirical continued anticoagulation and a 89 year old has its  own risks.  We decided that the best compromise will be for her to wear a smart watch with A-fib detection capabilities and discontinue the Eliquis  if there are no alerts in the next 3 months.  She is taking a tiny dose of metoprolol  succinate Anticoagulation: Has not had overt bleeding problems on anticoagulation, but is also has proven to be prone to falls with serious injuries.  CHA2DS2-VASc score is 5.  Has bled score is 2, but I think this is an underestimation at age 64. Takotsubo syndrome: In 2019, complete clinical recovery.  HTN: Fair control HLP: LDL cholesterol 94 is almost a 50% reduction from her level at 171 in January.  All other lipid parameters in target range.  Has not rechecked since switching to atorvastatin  40 mg daily. CAD: Asymptomatic.  Catheterization showed only moderate obstruction in a secondary branch and she has never had angina pectoris.    Medication Adjustments/Labs and Tests Ordered: Current medicines are reviewed at length with the patient today.  Concerns regarding medicines are outlined above.  No orders of the defined types were placed in this encounter.  Meds ordered this encounter  Medications   DISCONTD: apixaban  (ELIQUIS ) 2.5 MG TABS tablet    Sig: Take 1 tablet (2.5 mg total) by mouth 2 (two) times daily.    Dispense:  180 tablet    Refill:  3    Patient Instructions  Medication Instructions:  No changes- If there are no alerts on your SmartWatch for Afib or Aflutter within 3 months, then Eliquis  may be discontinued. (Please call our office or send a MyChart Message to notify us  of this) *If you need a refill on your cardiac medications before your next appointment, please call your pharmacy*  Lab Work: None ordered If you have labs (blood work) drawn today and your tests are completely normal, you will receive your results only by: MyChart Message (if you have MyChart) OR A paper copy in the mail If you have any lab test that is abnormal or we  need to change your treatment, we will call you to review the results.  Testing/Procedures: None ordered  Follow-Up: At [facility], you and your health needs are our priority.  As part of our continuing mission to provide you with exceptional heart care, our providers are all part of one team.  This team includes your primary Cardiologist (physician) and Advanced Practice Providers or APPs (Physician Assistants and Nurse Practitioners) who all work together to provide you with the care you need, when you need it.  Your next appointment:  1 year(s)  Provider:   Jerel Balding, MD    We recommend signing up for the patient portal called MyChart.  Sign up information is provided on this After Visit Summary.  MyChart is used to connect with patients for Virtual Visits (Telemedicine).  Patients are able to view lab/test results, encounter notes, upcoming appointments, etc.  Non-urgent messages can be sent to your provider as well.   To learn more about what you can do with MyChart, go to [email or web address].          Signed, Jerel Balding, MD  01/13/2024 5:14 PM    [facility]

## 2024-01-08 ENCOUNTER — Encounter: Payer: Self-pay | Admitting: Cardiovascular Disease

## 2024-01-08 ENCOUNTER — Ambulatory Visit: Attending: Cardiovascular Disease | Admitting: Cardiovascular Disease

## 2024-01-08 VITALS — BP 140/72 | HR 63 | Ht 61.0 in | Wt 130.8 lb

## 2024-01-08 DIAGNOSIS — D6869 Other thrombophilia: Secondary | ICD-10-CM | POA: Diagnosis not present

## 2024-01-08 DIAGNOSIS — Z8679 Personal history of other diseases of the circulatory system: Secondary | ICD-10-CM

## 2024-01-08 DIAGNOSIS — E7800 Pure hypercholesterolemia, unspecified: Secondary | ICD-10-CM | POA: Diagnosis not present

## 2024-01-08 DIAGNOSIS — I2510 Atherosclerotic heart disease of native coronary artery without angina pectoris: Secondary | ICD-10-CM

## 2024-01-08 DIAGNOSIS — I5181 Takotsubo syndrome: Secondary | ICD-10-CM | POA: Diagnosis not present

## 2024-01-08 MED ORDER — APIXABAN 2.5 MG PO TABS: 2.5000 mg | ORAL_TABLET | Freq: Two times a day (BID) | ORAL | 3 refills | Status: DC

## 2024-01-08 NOTE — Patient Instructions (Signed)
 Medication Instructions:  No changes- If there are no alerts on your SmartWatch for Afib or Aflutter within 3 months, then Eliquis  may be discontinued. (Please call our office or send a MyChart Message to notify us  of this) *If you need a refill on your cardiac medications before your next appointment, please call your pharmacy*  Lab Work: None ordered If you have labs (blood work) drawn today and your tests are completely normal, you will receive your results only by: MyChart Message (if you have MyChart) OR A paper copy in the mail If you have any lab test that is abnormal or we need to change your treatment, we will call you to review the results.  Testing/Procedures: None ordered  Follow-Up: At [facility], you and your health needs are our priority.  As part of our continuing mission to provide you with exceptional heart care, our providers are all part of one team.  This team includes your primary Cardiologist (physician) and Advanced Practice Providers or APPs (Physician Assistants and Nurse Practitioners) who all work together to provide you with the care you need, when you need it.  Your next appointment:   1 year(s)  Provider:   Jerel Balding, MD    We recommend signing up for the patient portal called MyChart.  Sign up information is provided on this After Visit Summary.  MyChart is used to connect with patients for Virtual Visits (Telemedicine).  Patients are able to view lab/test results, encounter notes, upcoming appointments, etc.  Non-urgent messages can be sent to your provider as well.   To learn more about what you can do with MyChart, go to [email or web address].

## 2024-01-28 ENCOUNTER — Other Ambulatory Visit: Payer: Self-pay | Admitting: Sports Medicine

## 2024-03-04 ENCOUNTER — Ambulatory Visit: Admitting: Internal Medicine

## 2024-03-04 ENCOUNTER — Encounter: Payer: Self-pay | Admitting: Internal Medicine

## 2024-03-04 VITALS — BP 152/82 | HR 62 | Ht 63.0 in | Wt 131.2 lb

## 2024-03-04 DIAGNOSIS — R918 Other nonspecific abnormal finding of lung field: Secondary | ICD-10-CM

## 2024-03-04 DIAGNOSIS — Z8781 Personal history of (healed) traumatic fracture: Secondary | ICD-10-CM

## 2024-03-04 DIAGNOSIS — Z8709 Personal history of other diseases of the respiratory system: Secondary | ICD-10-CM

## 2024-03-04 DIAGNOSIS — J329 Chronic sinusitis, unspecified: Secondary | ICD-10-CM

## 2024-03-04 DIAGNOSIS — R0982 Postnasal drip: Secondary | ICD-10-CM

## 2024-03-04 DIAGNOSIS — R053 Chronic cough: Secondary | ICD-10-CM

## 2024-03-04 MED ORDER — FLUTICASONE PROPIONATE 50 MCG/ACT NA SUSP: 2.0000 | Freq: Every day | NASAL | 3 refills | Status: DC

## 2024-03-04 NOTE — Progress Notes (Signed)
 OV 03/04/2024  Subjective:  Patient ID: Julie Petty, female , DOB: 02/27/1932 , age 89 y.o. , MRN: [id number] , ADDRESS: [address] [place] [address] PCP Jacques Camie Pepper, PA-C Patient Care Team: Jacques Camie Pepper DEVONNA as PCP - General (Physician Assistant) Francyne Headland, MD as PCP - Cardiology (Cardiology)  This Provider for this visit: Treatment Team:  Attending Provider: Geronimo Amel, MD    03/04/2024 -   Chief Complaint  Patient presents with   Consult    Referred for pleural effusion and abnormal CT. Pt denies any coughing, wheezing, or SOB. Pt fell and broke 8 ribs in February, and PCP told her she had fluid on her lungs then,      HPI Julie Petty 89 y.o. -presents by herself.  She lives at friend's home.  She is generally independent.  She is a widow x 2 years.  She has 2 sons siblings but 1 daughter and daughter one of her 59.  She does not have a woman accompanying her today.  She is an independent historian herself.  History is from her and external medical record review.  Her primary care nurse practitioner is at [facility].  She reports that in February 2025 she had influenza and then she tripped and fell on the floor.  Since then she has been using a cane and also a rollator.  She sustained several rib fractures that then resulted in a small 2% left pneumothorax and also some hemothorax that was trace.  She was then asked to see pulmonary.  Unclear why it is taken 7 months but she states the nurse practitioner insisted.  But she says she does not have any shortness of breath and feels well  Although at baseline she has had multi decade runny nose and she blows her nose all the time.  She says even her son has it.  Then in July 2025 she started noticing chronic cough that started particularly at night.  After she eats she has to blow her nose a lot.  She feels a something stuck in her throat.  There is no wheezing  there is no allergies.  She does have a history of acid reflux.  The CT chest did show some reticulonodular opacities in the right middle lobe and airway plugging.    CT CHEST 07/31/23   IMPRESSION: 1. Acute segmental fractures of the left third, fourth, sixth, seventh, eighth, and tenth ribs. Additional acute fractures of the left ninth and eleventh ribs. 2. There is a 2% or less left pneumothorax noted best seen anterior to the left pericardium. Trace left hemothorax. 3. Airway thickening with cylindrical bronchiectasis in the right middle lobe and airway plugging in the right middle lobe and both lower lobes. Clustered reticulonodular opacities in the right middle lobe posteriorly are increased from prior but still favor atypical infectious bronchiolitis based on morphology. 4. Dependent atelectasis in both lower lobes. 5. Mild cardiomegaly. 6. Coronary, aortic arch, and branch vessel atherosclerotic vascular disease. 7.  Aortic Atherosclerosis (ICD10-I70.0).   Radiology assistant personnel have been notified to put me in telephone contact with the referring physician or the referring physician's clinical representative in order to discuss these findings. Once this communication is established I will issue an addendum to this report for documentation purposes.   Electronically Signed: By: Ryan Salvage M.D. On: 07/31/2023 17:29      arrative & Impression  CLINICAL DATA:  Pneumothorax.  EXAM: CHEST - 2 VIEW   COMPARISON:  12/21/2019.  Chest CT 07/31/2023.   FINDINGS: The cardio pericardial silhouette is enlarged. There is pulmonary vascular congestion without overt pulmonary edema. Interstitial markings are diffusely coarsened with chronic features. The trace anterior pneumothorax seen on CT scan yesterday is not evident by x-ray today. Multiple left-sided rib fractures evident, better characterized on yesterday's CT scan. Bones are diffusely demineralized.  Telemetry leads overlie the chest.   IMPRESSION: 1. The trace anterior pneumothorax seen on CT scan yesterday is not evident by x-ray today. 2. Multiple left-sided rib fractures, better characterized on yesterday's CT scan.     Electronically Signed   By: Camellia Candle M.D.   On: 08/01/2023 07:38     LAB RESULTS last 96 hours No results found.       has a past medical history of Complication of anesthesia, Essential hypertension, GERD (gastroesophageal reflux disease), Glaucoma, Hernia, Hyperlipemia, Myocardial infarction (HCC), and PONV (postoperative nausea and vomiting).   reports that she has never smoked. She has never used smokeless tobacco.  Past Surgical History:  Procedure Laterality Date   CARDIAC CATHETERIZATION  04/05/03   CHOLECYSTECTOMY N/A 08/17/2018   Procedure: Laparoscopic Cholecystectomy;  Surgeon: Vanderbilt Ned, MD;  Location: [facility] OR;  Service: General;  Laterality: N/A;   KNEE ARTHROSCOPY  09/17/05   left   LEFT HEART CATH AND CORONARY ANGIOGRAPHY N/A 04/26/2018   Procedure: LEFT HEART CATH AND CORONARY ANGIOGRAPHY;  Surgeon: Anner Alm ORN, MD;  Location: [facility] INVASIVE CV LAB;  Service: Cardiovascular;  Laterality: N/A;   VAGINAL HYSTERECTOMY  1980's    Allergies  Allergen Reactions   Dicyclomine Itching, Nausea Only, Rash and Other (See Comments)    Stomach upset   Morphine  Sulfate Nausea And Vomiting   Mupirocin    Statins Other (See Comments)    Caused aching   Losartan  Potassium Other (See Comments)    Caused stomach upset   Morphine  And Codeine Other (See Comments)    Acid reflux, sick on stomach   Sulfa Antibiotics Nausea And Vomiting and Other (See Comments)    GI Intolerance   Butalbital-Asa-Caff-Codeine     Other Reaction(s): vomiting, vomiting, vomiting   Codeine Nausea Only    Other Reaction(s): GI Intolerance, Not available, vomiting   Elemental Sulfur Nausea Only   Losartan  Nausea Only    Immunization History  Administered  Date(s) Administered   INFLUENZA, HIGH DOSE SEASONAL PF 02/26/2016, 03/26/2019, 03/15/2020, 03/12/2021   Moderna Covid-19 Fall Seasonal Vaccine 29yrs & older 03/21/2022   PFIZER Comirnaty(Gray Top)Covid-19 Tri-Sucrose Vaccine 10/17/2020   PFIZER(Purple Top)SARS-COV-2 Vaccination 07/17/2019, 08/08/2019, 04/11/2020   Pfizer Covid-19 Vaccine Bivalent Booster 59yrs & up 03/12/2021    Family History  Problem Relation Age of Onset   Heart attack Brother      Current Outpatient Medications:    acetaminophen  (TYLENOL ) 500 MG tablet, Take 2 tablets (1,000 mg total) by mouth 3 (three) times daily. Continue 1 g 3 times daily for 5 to 7 days and then consider changing to 1 g 3 times daily as needed for pain., Disp: , Rfl:    apixaban  (ELIQUIS ) 2.5 MG TABS tablet, Take 2.5 mg by mouth 2 (two) times daily., Disp: , Rfl:    atorvastatin  (LIPITOR) 10 MG tablet, Take 10 mg by mouth daily., Disp: , Rfl:    brinzolamide  (AZOPT ) 1 % ophthalmic suspension, Place 1 drop into both eyes 2 (two) times daily., Disp: 10 mL, Rfl: 0   cephALEXin  (KEFLEX ) 250  MG capsule, Take 1 capsule (250 mg total) by mouth at bedtime., Disp: 30 capsule, Rfl: 0   Cholecalciferol  (VITAMIN D3) 125 MCG (5000 UT) CAPS, Take 1 capsule by mouth daily., Disp: , Rfl:    ezetimibe  (ZETIA ) 10 MG tablet, Take 1 tablet (10 mg total) by mouth daily., Disp: 90 tablet, Rfl: 3   fluticasone  (FLONASE ) 50 MCG/ACT nasal spray, Place 2 sprays into both nostrils daily., Disp: 1 g, Rfl: 3   gabapentin  (NEURONTIN ) 100 MG capsule, Take 1 capsule (100 mg total) by mouth daily., Disp: 30 capsule, Rfl: 0   metoprolol  succinate (TOPROL -XL) 25 MG 24 hr tablet, Take 0.5 tablets (12.5 mg total) by mouth daily., Disp: 45 tablet, Rfl: 3   omeprazole  (PRILOSEC) 20 MG capsule, TAKE 1 CAPSULE BY MOUTH DAILY, Disp: 90 capsule, Rfl: 1   atorvastatin  (LIPITOR) 40 MG tablet, Take 1 tablet (40 mg total) by mouth daily. (Patient not taking: Reported on 01/08/2024), Disp: 90  tablet, Rfl: 3      Objective:   Vitals:   03/04/24 0854  BP: (!) 152/82  Pulse: 62  SpO2: 98%  Weight: 131 lb 3.2 oz (59.5 kg)  Height: 5' 3 (1.6 m)    Estimated body mass index is 23.24 kg/m as calculated from the following:   Height as of this encounter: 5' 3 (1.6 m).   Weight as of this encounter: 131 lb 3.2 oz (59.5 kg).  @WEIGHTCHANGE @  [employer]   03/04/24 0854  Weight: 131 lb 3.2 oz (59.5 kg)     Physical Exam   General: No distress. Looks well O2 at rest: no Cane present: no Sitting in wheel chair: no Frail: no Obese: no Neuro: Alert and Oriented x 3. GCS 15. Speech normal Psych: Pleasant Resp:  Barrel Chest - no.  Wheeze - n, Crackles - n, No overt respiratory distress CVS: Normal heart sounds. Murmurs - no Ext: Stigmata of Connective Tissue Disease - no HEENT: Normal upper airway. PEERL +. No post nasal drip        Assessment/     Assessment & Plan Chronic cough  Pulmonary infiltrate present on computed tomography  Post-nasal drip  History of rib fracture  History of pneumothorax  Chronic sinusitis, unspecified location    PLAN Patient Instructions  Chronic cough Post-nasal drip with chronic sinusitis History of reticulonodular opacities in the lung in February 2025 CT chest  Plan   START/take generic fluticasone  inhaler 2 squirts each nostril daily Get CT sinus and HRCT chest without contrst  History fall - feb 2025 History of rib fracture - feb 2025 History of pneumothorax - feb 2025 Hitory hemothorax -feb 2025  - clinically well  plan - Get CT chest.wo contrast  Followup  4-8 weeks with APP to discuss results and course of cough    FOLLOWUP    Return for  4-8 weeks with APP to discuss results and course of cough.    SIGNATURE    Dr. Dorethia Cave, M.D., F.C.C.P,  Pulmonary and Critical Care Medicine Staff Physician, [facility] Center Director - Interstitial Lung Disease  Program   Pulmonary Fibrosis [facility] at [facility] [place], [place], [address]  Pager: [phone], If no answer or between  15:00h - 7:00h: call [phone] Telephone: [phone]  9:39 AM 03/04/2024

## 2024-03-04 NOTE — Patient Instructions (Addendum)
 Chronic cough Post-nasal drip with chronic sinusitis History of reticulonodular opacities in the lung in February 2025 CT chest  Plan   START/take generic fluticasone  inhaler 2 squirts each nostril daily Get CT sinus and HRCT chest without contrst  History fall - feb 2025 History of rib fracture - feb 2025 History of pneumothorax - feb 2025 Hitory hemothorax -feb 2025  - clinically well  plan - Get CT chest.wo contrast  Followup  4-8 weeks with APP to discuss results and course of cough

## 2024-03-30 ENCOUNTER — Ambulatory Visit (HOSPITAL_BASED_OUTPATIENT_CLINIC_OR_DEPARTMENT_OTHER)
Admission: RE | Admit: 2024-03-30 | Discharge: 2024-03-30 | Disposition: A | Discharge status: Home / Self Care | Source: Ambulatory Visit | Attending: Internal Medicine | Admitting: Internal Medicine

## 2024-03-30 DIAGNOSIS — R053 Chronic cough: Secondary | ICD-10-CM | POA: Diagnosis present

## 2024-03-30 DIAGNOSIS — Z8709 Personal history of other diseases of the respiratory system: Secondary | ICD-10-CM | POA: Diagnosis present

## 2024-03-30 DIAGNOSIS — J329 Chronic sinusitis, unspecified: Secondary | ICD-10-CM | POA: Diagnosis present

## 2024-03-30 DIAGNOSIS — Z8781 Personal history of (healed) traumatic fracture: Secondary | ICD-10-CM | POA: Diagnosis present

## 2024-03-30 DIAGNOSIS — R0982 Postnasal drip: Secondary | ICD-10-CM | POA: Diagnosis present

## 2024-03-30 DIAGNOSIS — R918 Other nonspecific abnormal finding of lung field: Secondary | ICD-10-CM | POA: Insufficient documentation

## 2024-04-03 ENCOUNTER — Ambulatory Visit: Payer: Self-pay | Admitting: Internal Medicine

## 2024-04-03 NOTE — Progress Notes (Signed)
  IMPRESSION: 1. No evidence of interstitial lung disease. Mild air trapping is indicative of small airways disease. 2. Mild central bronchiectasis.  Scattered minimal mucoid impaction. 3. Aortic atherosclerosis (ICD10-I70.0). Coronary artery calcification.     Electronically Signed   By: Newell Eke M.D.   On: 04/01/2024 12:53

## 2024-04-05 ENCOUNTER — Ambulatory Visit (HOSPITAL_BASED_OUTPATIENT_CLINIC_OR_DEPARTMENT_OTHER)

## 2024-04-06 NOTE — Progress Notes (Signed)
  IMPRESSION: 1. No evidence of acute or chronic sinusitis 2. Moderate rightward nasal septal deviation with osseous spur, without turbinate contact   Electronically signed by: Franky Stanford MD 04/03/2024 11:55 AM EDT RP Workstation: [id number]

## 2024-04-13 ENCOUNTER — Telehealth: Payer: Self-pay | Admitting: Cardiovascular Disease

## 2024-04-13 NOTE — Telephone Encounter (Signed)
 Pts sister calling with heart rate logs requested by Dr. Francyne. Please advise.

## 2024-04-13 NOTE — Telephone Encounter (Signed)
 Pt asked me to call her sister to discuss concerns. Called sister, Julie Petty @ [phone] to discuss.  She reports pt's apple watch has been reporting pt being in afib weekly, says is varies but ranging from 7-12 %, last week it was 25%. I sent instructions on how to send strips via mychart (as they were having difficulty).  Sister is going to try and send what they have.  Aware if experiencing weekly we may have to do another monitor for more detailed report, but will await MD recommendation.  Forwarding to MD for advisement..... Sister was asking about follow up (currenly recall for next summer)..... Please call sister with advisement.

## 2024-04-15 ENCOUNTER — Ambulatory Visit: Admitting: Primary Care

## 2024-04-15 ENCOUNTER — Encounter: Payer: Self-pay | Admitting: Primary Care

## 2024-04-15 VITALS — BP 118/73 | HR 58 | Temp 97.9°F | Ht 63.0 in | Wt 133.0 lb

## 2024-04-15 DIAGNOSIS — J3089 Other allergic rhinitis: Secondary | ICD-10-CM | POA: Diagnosis not present

## 2024-04-15 DIAGNOSIS — R053 Chronic cough: Secondary | ICD-10-CM | POA: Diagnosis not present

## 2024-04-15 MED ORDER — AZELASTINE HCL 0.1 % NA SOLN: 1.0000 | Freq: Two times a day (BID) | NASAL | 1 refills | Status: DC

## 2024-04-15 MED ORDER — GUAIFENESIN ER 600 MG PO TB12: 600.0000 mg | ORAL_TABLET | Freq: Two times a day (BID) | ORAL | 1 refills | Status: AC | PRN

## 2024-04-15 NOTE — Progress Notes (Signed)
 @Patient  ID: Julie Petty, female    DOB: 1932/03/23, 89 y.o.   MRN: [id number]  No chief complaint on file.   Referring provider: Jacques Camie Pepper, SHAUNNA*  HPI: 89 year old, never smoked. PMH significant for HTN, NSTEMI, cardiomyopathy, CAD, hx fall with rib fracture, pneumothorax/hemothorax in February 2025.   04/15/2024 Discussed the use of AI scribe software for clinical note transcription with the patient, who gave verbal consent to proceed.  History of Present Illness Julie Petty is a 89 year old female who presents for follow-up regarding chronic cough and PND with chronic sinusitis. She complaints of persistent mucus in the throat.  She has been experiencing persistent mucus in the back of her throat since July 2025, which is difficult to clear. The mucus accumulation leads to coughing. No wheezing or shortness of breath. She finds relief by drinking hot tea with honey and lemon in the morning.  In September 2025, she was prescribed a nasal spray, which she could not use due to her glaucoma. Her eye doctor recommended Astapro (azelastine), which she has been using with some benefit, though it has not completely resolved her symptoms. She administers one spray per nostril twice daily.  She underwent CT scan chest and sinuses which was negative for interstitial lung disease; mild air trapping indicative of small airway disease along with mild central bronchiectasis with scattered minimal mucoid impaction. CT sinuses without evidence of acute or chronic sinusitis, moderate rightward nasal septal deviation. She is concerned about the cost of medications, noting that her previous prescriptions were provided at no cost. She uses [facility] home delivery for her medications.   Allergies  Allergen Reactions   Dicyclomine Itching, Nausea Only, Rash and Other (See Comments)    Stomach upset   Morphine  Sulfate Nausea And Vomiting   Mupirocin    Statins Other (See Comments)     Caused aching   Losartan  Potassium Other (See Comments)    Caused stomach upset   Morphine  And Codeine Other (See Comments)    Acid reflux, sick on stomach   Sulfa Antibiotics Nausea And Vomiting and Other (See Comments)    GI Intolerance   Butalbital-Asa-Caff-Codeine     Other Reaction(s): vomiting, vomiting, vomiting   Codeine Nausea Only    Other Reaction(s): GI Intolerance, Not available, vomiting   Elemental Sulfur Nausea Only   Losartan  Nausea Only    Immunization History  Administered Date(s) Administered   INFLUENZA, HIGH DOSE SEASONAL PF 02/26/2016, 03/26/2019, 03/15/2020, 03/12/2021   Moderna Covid-19 Fall Seasonal Vaccine 60yrs & older 03/21/2022   PFIZER Comirnaty(Gray Top)Covid-19 Tri-Sucrose Vaccine 10/17/2020   PFIZER(Purple Top)SARS-COV-2 Vaccination 07/17/2019, 08/08/2019, 04/11/2020   Pfizer Covid-19 Vaccine Bivalent Booster 20yrs & up 03/12/2021    Past Medical History:  Diagnosis Date   Complication of anesthesia    Essential hypertension    GERD (gastroesophageal reflux disease)    Glaucoma    Hernia    Hyperlipemia    Myocardial infarction (HCC)    NSTEMI 04/24/18 (40% D2 by LHC; Dx: Takotsubo CM vs coronary spasm vs myocarditis)   PONV (postoperative nausea and vomiting)     Tobacco History: Social History   Tobacco Use  Smoking Status Never  Smokeless Tobacco Never   Counseling given: Not Answered   Outpatient Medications Prior to Visit  Medication Sig Dispense Refill   acetaminophen  (TYLENOL ) 500 MG tablet Take 2 tablets (1,000 mg total) by mouth 3 (three) times daily. Continue 1 g 3 times daily for 5  to 7 days and then consider changing to 1 g 3 times daily as needed for pain.     apixaban  (ELIQUIS ) 2.5 MG TABS tablet Take 2.5 mg by mouth 2 (two) times daily.     atorvastatin  (LIPITOR) 10 MG tablet Take 10 mg by mouth daily.     atorvastatin  (LIPITOR) 40 MG tablet Take 1 tablet (40 mg total) by mouth daily. (Patient not taking: Reported on  01/08/2024) 90 tablet 3   brinzolamide  (AZOPT ) 1 % ophthalmic suspension Place 1 drop into both eyes 2 (two) times daily. 10 mL 0   cephALEXin  (KEFLEX ) 250 MG capsule Take 1 capsule (250 mg total) by mouth at bedtime. 30 capsule 0   Cholecalciferol  (VITAMIN D3) 125 MCG (5000 UT) CAPS Take 1 capsule by mouth daily.     ezetimibe  (ZETIA ) 10 MG tablet Take 1 tablet (10 mg total) by mouth daily. 90 tablet 3   fluticasone  (FLONASE ) 50 MCG/ACT nasal spray Place 2 sprays into both nostrils daily. 1 g 3   gabapentin  (NEURONTIN ) 100 MG capsule Take 1 capsule (100 mg total) by mouth daily. 30 capsule 0   metoprolol  succinate (TOPROL -XL) 25 MG 24 hr tablet Take 0.5 tablets (12.5 mg total) by mouth daily. 45 tablet 3   omeprazole  (PRILOSEC) 20 MG capsule TAKE 1 CAPSULE BY MOUTH DAILY 90 capsule 1   No facility-administered medications prior to visit.    Review of Systems  Review of Systems  Constitutional: Negative.   HENT:  Positive for congestion and postnasal drip.   Respiratory:  Positive for chest tightness. Negative for cough and shortness of breath.    Physical Exam  There were no vitals taken for this visit. Physical Exam Constitutional:      General: She is not in acute distress.    Appearance: Normal appearance. She is not ill-appearing.  HENT:     Head: Normocephalic and atraumatic.     Mouth/Throat:     Mouth: Mucous membranes are moist.     Pharynx: Oropharynx is clear.  Cardiovascular:     Rate and Rhythm: Normal rate and regular rhythm.  Pulmonary:     Effort: Pulmonary effort is normal.     Breath sounds: Rhonchi present.     Comments: Right right upper base, no overt wheezing  Skin:    General: Skin is warm and dry.  Neurological:     General: No focal deficit present.     Mental Status: She is alert and oriented to person, place, and time. Mental status is at baseline.  Psychiatric:        Mood and Affect: Mood normal.        Behavior: Behavior normal.         Thought Content: Thought content normal.        Judgment: Judgment normal.      Lab Results:  CBC    Component Value Date/Time   WBC 5.1 08/03/2023 0536   RBC 4.28 08/03/2023 0536   HGB 14.0 08/03/2023 0536   HCT 41.8 08/03/2023 0536   PLT 180 08/03/2023 0536   MCV 97.7 08/03/2023 0536   MCH 32.7 08/03/2023 0536   MCHC 33.5 08/03/2023 0536   RDW 12.6 08/03/2023 0536   LYMPHSABS 1.1 08/11/2018 1045   MONOABS 0.5 08/11/2018 1045   EOSABS 0.2 08/11/2018 1045   BASOSABS 0.0 08/11/2018 1045    BMET    Component Value Date/Time   NA 134 (L) 08/04/2023 0530   K 4.4 08/04/2023 0530  CL 99 08/04/2023 0530   CO2 23 08/04/2023 0530   GLUCOSE 103 (H) 08/04/2023 0530   BUN 16 08/04/2023 0530   CREATININE 0.90 08/04/2023 0530   CALCIUM  8.5 (L) 08/04/2023 0530   GFRNONAA >60 08/04/2023 0530   GFRAA 56 (L) 12/21/2019 1134    BNP No results found for: BNP  ProBNP No results found for: PROBNP  Imaging: CT MAXILLOFACIAL WO CONTRAST Result Date: 04/03/2024 EXAM: CT OF THE FACE WITHOUT CONTRAST 03/30/2024 05:06:15 PM TECHNIQUE: CT of the face was performed without the administration of intravenous contrast. Multiplanar reformatted images are provided for review. Automated exposure control, iterative reconstruction, and/or weight based adjustment of the mA/kV was utilized to reduce the radiation dose to as low as reasonably achievable. COMPARISON: None available. CLINICAL HISTORY: Chronic sinusitis, chronic cough, pulmonary infiltrate present on computed tomography, post-nasal drip, history of rib fracture, history of pneumothorax, chronic sinusitis, unspecified location. FINDINGS: FACIAL BONES: Bilateral temporomandibular joint osteoarthritis. No acute facial fracture. No mandibular dislocation. No suspicious bone lesion. ORBITS: Globes are intact. No acute traumatic injury. No inflammatory change. SINUSES AND MASTOIDS: All paranasal sinuses and sinus drainage pathways are clear.  There is moderate rightward deviation of the nasal septum with the rightward projecting osseous spur but no turbinate contact. No acute abnormality of the mastoids. SOFT TISSUES: No acute abnormality. IMPRESSION: 1. No evidence of acute or chronic sinusitis 2. Moderate rightward nasal septal deviation with osseous spur, without turbinate contact Electronically signed by: Franky Stanford MD 04/03/2024 11:55 AM EDT RP Workstation: [id number]   CT Chest High Resolution Result Date: 04/01/2024 CLINICAL DATA:  Chronic cough.  Postnasal drip. EXAM: CT CHEST WITHOUT CONTRAST TECHNIQUE: Multidetector CT imaging of the chest was performed following the standard protocol without intravenous contrast. High resolution imaging of the lungs, as well as inspiratory and expiratory imaging, was performed. RADIATION DOSE REDUCTION: This exam was performed according to the departmental dose-optimization program which includes automated exposure control, adjustment of the mA and/or kV according to patient size and/or use of iterative reconstruction technique. COMPARISON:  07/31/2023. FINDINGS: Cardiovascular: Atherosclerotic calcification of the aorta, aortic valve and coronary arteries. Heart is enlarged. No pericardial effusion. Mediastinum/Nodes: Small mediastinal lymph nodes are not enlarged by CT size criteria and appear unchanged. No pathologically enlarged mediastinal or axillary lymph nodes. Hilar regions are difficult to definitively evaluate without IV contrast. Periesophageal lymph nodes measure up to approximately 10 mm (3/67), also similar. Air in the esophagus can be seen with dysmotility. Lungs/Pleura: Mild bibasilar scarring. Negative for subpleural reticulation, traction bronchiectasis/bronchiolectasis, ground glass, architectural distortion or honeycombing. Mild cylindrical bronchiectasis. Minimal scattered mucoid impaction. No pleural fluid. Airway is unremarkable. Mild air trapping. Upper Abdomen: Cholecystectomy.  Visualized portions of the liver, adrenal glands, kidneys, spleen, pancreas, stomach and bowel are grossly unremarkable. No upper abdominal adenopathy. Musculoskeletal: Osteopenia. Degenerative changes in the spine. Old left rib fractures and deformity. IMPRESSION: 1. No evidence of interstitial lung disease. Mild air trapping is indicative of small airways disease. 2. Mild central bronchiectasis.  Scattered minimal mucoid impaction. 3. Aortic atherosclerosis (ICD10-I70.0). Coronary artery calcification. Electronically Signed   By: Newell Eke M.D.   On: 04/01/2024 12:53     Assessment & Plan:   1. Chronic cough (Primary) - Ambulatory Referral for DME  2. Non-seasonal allergic rhinitis due to other allergic trigger  Assessment and Plan Assessment & Plan Chronic postnasal drip with mucus accumulation Chronic postnasal drip with mucus accumulation back of her throat causing discomfort and cough. Previous nasal spray, fluticasone ,  was unsuitable due to glaucoma. Azelastine nasal spray (Astapro) has been effective but costly. No evidence of fibrosis in lungs, but she has mild bronchiectasis and mucus impaction. - Prescribe azelastine nasal spray (30 mL) for use as one spray per nostril twice a day. - Recommend Mucinex 600mg  tablet twice daily to be taken with a glass of water - Advise use of hard candies to help with throat discomfort. - Instruct to return if symptoms persist after a few weeks for potential inhaler trial.  Mild bronchiectasis  - Advise using the flutter valve about five times, three times a day. - Monitor for concerning symptoms such as blood in mucus, bad taste, or fever.  I personally spent a total of 30 minutes in the care of the patient today including counseling and educating, placing orders, independently interpreting results, and coordinating care.   Julie LELON Ferrari, NP 04/15/2024

## 2024-04-15 NOTE — Telephone Encounter (Signed)
 Dr Francyne spoke with Rock- she is going to print up the strips and mail them to Dr Francyne.  No change in medication.

## 2024-04-15 NOTE — Patient Instructions (Signed)
  VISIT SUMMARY: Today, we discussed your persistent mucus in the throat, which has been ongoing since July 2025. We reviewed your current treatment with Astapro nasal spray and discussed additional measures to help alleviate your symptoms.  YOUR PLAN: -CHRONIC POSTNASAL DRIP WITH MUCUS ACCUMULATION: Chronic postnasal drip means that mucus from your nose drips down the back of your throat, causing discomfort and coughing. You will continue using the azelastine nasal spray (Astapro) with one spray per nostril twice a day. Additionally, you should take a Mucinex tablet with a glass of water in the morning and evening, and use hard candies to help with throat discomfort. If your symptoms persist after a few weeks, we may consider trying an inhaler.  -PULMONARY CONGESTION WITH MILD SCARRING, RIGHT LUNG PREDOMINANT: Pulmonary congestion with mild scarring means there is some mucus buildup and minor scarring in your lungs, likely from past infections. You will use a flutter valve (acapella device) to help clear the mucus. Adjust the resistance to a comfortable level and use it about five times, three times a day. Watch for any concerning symptoms like blood in your mucus, a bad taste, or fever.  INSTRUCTIONS: Please return for a follow-up visit if your symptoms persist after a few weeks, so we can consider additional treatments like an inhaler. Monitor for any concerning symptoms such as blood in your mucus, a bad taste, or fever, and report them immediately.  Follow-up 3-4 months with Dr. Geronimo or sooner if needed

## 2024-04-26 ENCOUNTER — Encounter: Payer: Self-pay | Admitting: Cardiovascular Disease

## 2024-04-26 NOTE — Telephone Encounter (Signed)
 Yes, please continue Eliquis 

## 2024-05-02 ENCOUNTER — Other Ambulatory Visit: Payer: Self-pay

## 2024-05-03 ENCOUNTER — Telehealth: Payer: Self-pay

## 2024-05-03 MED ORDER — APIXABAN 2.5 MG PO TABS: 2.5000 mg | ORAL_TABLET | Freq: Two times a day (BID) | ORAL | 5 refills | Status: AC

## 2024-05-03 NOTE — Telephone Encounter (Signed)
 Copied from CRM [id number]. Topic: Clinical - Order For Equipment >> May 03, 2024 12:58 PM Julie Petty wrote: Reason for CRM: Patient is calling to advise that she has received the valve but is unsure of what to do with it/is she missing additional equipment? Please call back to advise.   [id number]

## 2024-05-03 NOTE — Telephone Encounter (Signed)
 Prescription refill request for Eliquis  received. Indication:aflutter Last office visit:7/25 Scr:1.01  7/25 Age: 89 Weight:60.3  kg  Prescription refilled

## 2024-05-04 NOTE — Telephone Encounter (Signed)
 ATC x2.  LVM to return call. Message sent to Mitch ([employer]) regarding issues with flutter valve missing equipment.  Will await return call from patient.

## 2024-05-05 ENCOUNTER — Other Ambulatory Visit: Payer: Self-pay | Admitting: Sports Medicine

## 2024-05-06 ENCOUNTER — Telehealth: Payer: Self-pay

## 2024-05-06 NOTE — Telephone Encounter (Signed)
 Copied from CRM [id number]. Topic: Clinical - Order For Equipment >> May 05, 2024 11:10 AM Julie Petty wrote: Reason for CRM: Pt is calling back after stating she didn't receive a call back regarding her request for the missing equipment from the flutter valve received. I see an encounter from RN Powell Dave Sans stating she called the pt back 2x on 05/04/2024 in the evening with no answer. She state she sent a message to Mitch ([employer]) regarding the issue with the flutter valve. Pt will be having lunch from 12pm-1pm so please call outside of that window.   Please call the pt back at [phone] ok to leave a vm. >> May 05, 2024  1:08 PM Julie Petty wrote: Pt returning call that was made to her today about her equipment.     Spoke with patient VBU,. Someone from [employer] will be reaching out    - NFN

## 2024-06-12 ENCOUNTER — Other Ambulatory Visit: Payer: Self-pay | Admitting: Cardiology

## 2024-07-03 ENCOUNTER — Other Ambulatory Visit: Payer: Self-pay | Admitting: Cardiology

## 2024-07-19 ENCOUNTER — Other Ambulatory Visit: Payer: Self-pay | Admitting: Primary Care

## 2024-07-25 ENCOUNTER — Telehealth: Payer: Self-pay | Admitting: Cardiovascular Disease

## 2024-07-25 NOTE — Telephone Encounter (Signed)
" °  Pt c/o swelling/edema: STAT if pt has developed SOB within 24 hours  If swelling, where is the swelling located? Both feet  How much weight have you gained and in what time span?   Have you gained 2 pounds in a day or 5 pounds in a week? No   Do you have a log of your daily weights (if so, list)? None   Are you currently taking a fluid pill? No   Are you currently SOB? None   Have you traveled recently in a car or plane for an extended period of time? No   The patient stated that on Saturday morning she noticed swelling in her feet. She said this is the first time this has happened, and that it comes and goes. She also stated that when she elevates her feet, the swelling resolves. "

## 2024-07-25 NOTE — Telephone Encounter (Signed)
 Ankle swelling is a very common side effect of amlodipine .  It is not dangerous.  If the swelling resolves spontaneously after lying horizontally overnight, I do not think we should change her medications.  Avoiding salty foods will help prevent this from happening.

## 2024-07-25 NOTE — Telephone Encounter (Signed)
 Spoke with pt who stated starting Friday night she noticed swelling in her feet when she got out of the shower. The swelling went down and then came back again on Saturday. She said the swelling resolves when she elevates her legs. Pt denies increased salt intake and denies being on her feet more than usual when the swelling occurs. Pt states her feet are not swollen at this moment and denies any other symptoms. Pt is on Amlodipine  2.5 mg daily. Please advise.

## 2024-07-25 NOTE — Telephone Encounter (Signed)
 Spoke with pt and let her know Dr. Tyrone recommendations. Pt verbalized understanding of plan and had no further questions at this time.

## 2024-08-01 ENCOUNTER — Ambulatory Visit: Admitting: Primary Care

## 2024-08-01 DIAGNOSIS — R911 Solitary pulmonary nodule: Secondary | ICD-10-CM
